# Patient Record
Sex: Female | Born: 1945 | Race: White | Hispanic: No | Smoking: Never smoker
Health system: Southern US, Community
[De-identification: ages and names within clinical notes are randomized; demographics above are authoritative.]

## PROBLEM LIST (undated history)

## (undated) DIAGNOSIS — IMO0001 Reserved for inherently not codable concepts without codable children: Secondary | ICD-10-CM

## (undated) DIAGNOSIS — E669 Obesity, unspecified: Secondary | ICD-10-CM

## (undated) DIAGNOSIS — S9304XA Dislocation of right ankle joint, initial encounter: Secondary | ICD-10-CM

## (undated) DIAGNOSIS — F419 Anxiety disorder, unspecified: Secondary | ICD-10-CM

## (undated) DIAGNOSIS — F0390 Unspecified dementia without behavioral disturbance: Secondary | ICD-10-CM

## (undated) DIAGNOSIS — I2699 Other pulmonary embolism without acute cor pulmonale: Secondary | ICD-10-CM

## (undated) DIAGNOSIS — I509 Heart failure, unspecified: Secondary | ICD-10-CM

## (undated) DIAGNOSIS — J449 Chronic obstructive pulmonary disease, unspecified: Secondary | ICD-10-CM

## (undated) DIAGNOSIS — J189 Pneumonia, unspecified organism: Secondary | ICD-10-CM

## (undated) DIAGNOSIS — S82891A Other fracture of right lower leg, initial encounter for closed fracture: Secondary | ICD-10-CM

## (undated) DIAGNOSIS — I10 Essential (primary) hypertension: Secondary | ICD-10-CM

## (undated) DIAGNOSIS — E119 Type 2 diabetes mellitus without complications: Secondary | ICD-10-CM

---

## 1996-01-29 HISTORY — PX: KNEE SURGERY: SHX244

## 2004-10-16 ENCOUNTER — Ambulatory Visit: Payer: Self-pay | Admitting: Cardiology

## 2004-12-25 ENCOUNTER — Encounter: Admission: RE | Admit: 2004-12-25 | Discharge: 2004-12-25 | Payer: Self-pay | Admitting: Internal Medicine

## 2005-02-25 ENCOUNTER — Encounter: Admission: RE | Admit: 2005-02-25 | Discharge: 2005-02-25 | Payer: Self-pay | Admitting: Internal Medicine

## 2005-03-11 ENCOUNTER — Encounter: Admission: RE | Admit: 2005-03-11 | Discharge: 2005-03-11 | Payer: Self-pay | Admitting: Internal Medicine

## 2005-07-02 ENCOUNTER — Encounter: Admission: RE | Admit: 2005-07-02 | Discharge: 2005-07-02 | Payer: Self-pay | Admitting: Internal Medicine

## 2005-10-16 ENCOUNTER — Encounter: Admission: RE | Admit: 2005-10-16 | Discharge: 2005-10-16 | Payer: Self-pay | Admitting: Neurosurgery

## 2005-11-29 ENCOUNTER — Ambulatory Visit: Admission: RE | Admit: 2005-11-29 | Discharge: 2005-11-29 | Payer: Self-pay | Admitting: Neurosurgery

## 2005-12-05 ENCOUNTER — Ambulatory Visit: Payer: Self-pay | Admitting: Internal Medicine

## 2006-01-03 ENCOUNTER — Ambulatory Visit: Admission: RE | Admit: 2006-01-03 | Discharge: 2006-01-03 | Payer: Self-pay | Admitting: Neurosurgery

## 2006-01-15 ENCOUNTER — Ambulatory Visit: Payer: Self-pay | Admitting: Cardiology

## 2006-01-28 HISTORY — PX: BACK SURGERY: SHX140

## 2006-02-14 ENCOUNTER — Ambulatory Visit: Payer: Self-pay | Admitting: Cardiology

## 2006-03-05 ENCOUNTER — Inpatient Hospital Stay (HOSPITAL_COMMUNITY): Admission: RE | Admit: 2006-03-05 | Discharge: 2006-03-12 | Payer: Self-pay | Admitting: Neurosurgery

## 2006-03-05 ENCOUNTER — Ambulatory Visit: Payer: Self-pay | Admitting: Pulmonary Disease

## 2006-03-06 ENCOUNTER — Ambulatory Visit: Payer: Self-pay | Admitting: Physical Medicine & Rehabilitation

## 2006-04-24 ENCOUNTER — Encounter: Admission: RE | Admit: 2006-04-24 | Discharge: 2006-04-24 | Payer: Self-pay | Admitting: Neurosurgery

## 2006-06-26 ENCOUNTER — Encounter: Admission: RE | Admit: 2006-06-26 | Discharge: 2006-06-26 | Payer: Self-pay | Admitting: Neurosurgery

## 2010-02-17 ENCOUNTER — Encounter: Payer: Self-pay | Admitting: Neurosurgery

## 2010-02-18 ENCOUNTER — Encounter: Payer: Self-pay | Admitting: Internal Medicine

## 2010-06-15 NOTE — H&P (Signed)
NAMEELVI, [name]                 ACCOUNT NO.:  [id number]   MEDICAL RECORD NO.:  [id number]          PATIENT TYPE:  INP   LOCATION:  3110                         FACILITY:  [facility]   PHYSICIAN:  Hilda Lias, M.D.   DATE OF BIRTH:  [birth year]   DATE OF ADMISSION:  03/05/2006  DATE OF DISCHARGE:                              HISTORY & PHYSICAL   [name] is a lady who was seen by me in my office  back in September  2007 with a complaint of back pain that had been going on for the last 3  years to the point that when she came, she was crying because the pain  unbearable.  She told me that she did not want to live the way it was  and she preferred to die.  The pain was in both legs but the left one  was worse than the right one.  The pain is aggravated because she has  had bilateral knee replacement and she did not improve.  We scheduled  her for surgery but we found that she has poorly controlled diabetes as  well as blood pressure.  Twice we had to cancel and today she had been  cleared by the cardiologist and pulmonologist.   PAST MEDICAL HISTORY:  Total knee replacement.   ALLERGIES:  SHE IS ALLERGIC TO TALWIN.   SOCIAL HISTORY:  Patient does not smoke, does not drink.   She is 5' 1 and she is over 180 kg.   REVIEW OF SYSTEMS:  Positive for back pain, leg pain, arthritis, high  blood pressure, asthma, emphysema, sleep apnea.   PHYSICAL EXAMINATION:  Patient came to see me in my office __________  normal.  NECK:  She has __________  bilaterally.  CARDIOVASCULAR:   ***AWAITING CARDIOVASCULAR MACRO***  ABDOMEN:  Difficult to palpate any mass secondary to her obesity.  EXTREMITIES:  She has a scar in the knee.  It is difficult to assess any  weakness in the lower extremity because the patient __________ .  It  seems that she has a good sensation in the lower extremity.  Reflexes  unable to get.   The MRI shows that she has facet arthropathy of the lower L4-5, L5, S1.   IMPRESSION:  Chronic back pain with degenerative disc disease and facet  arthropathy L4-5, L5, S1.   RECOMMENDATIONS:  The patient is being admitted for surgery.  The  procedure will be at L4-5, L5, S1 discectomy, interbody fusion,  pedicular screws posterior lateral effusion.  The risks, of course,  __________  because of the chronicity of the pain, infection, damage to  vessel of the abdomen, pulmonary emboli, pneumonia and all the risks  associated with morbid obesity.          ______________________________  Hilda Lias, M.D.    EB/MEDQ  D:  03/05/2006  T:  03/06/2006  Job:  [id number]

## 2010-06-15 NOTE — Op Note (Signed)
[name], [name]                 ACCOUNT NO.:  [id number]   MEDICAL RECORD NO.:  [id number]          PATIENT TYPE:  INP   LOCATION:  2899                         FACILITY:  [facility]   PHYSICIAN:  Hilda Lias, M.D.   DATE OF BIRTH:  [birth year]   DATE OF PROCEDURE:  03/05/2006  DATE OF DISCHARGE:                               OPERATIVE REPORT   ADMISSION DIAGNOSIS:  Degenerative disk disease with chronic back pain,  facet arthropathy L4-5, L5-S1.  Morbid obesity and sleep apnea, diabetes  mellitus.   POSTOPERATIVE DIAGNOSIS:  Degenerative disk disease with chronic back  pain, facet arthropathy L4-5, L5-S1.  Morbid obesity and sleep apnea,  diabetes mellitus.   PROCEDURE:  Bilateral L4-5 laminectomy, bilateral L4-5 diskectomy,  interbody fusion.  Pedicle screws L4, L5-S1, posterolateral arthrodesis.  Cell Saver, C-arm.   SURGEON:  Hilda Lias, M.D.   ASSISTANT:  Payton Doughty, M.D.   CLINICAL HISTORY:  The patient was admitted because of back pain with  radiation to both legs.  X-rays show severe case of degenerative disk  disease at the level 4-5, 5-1.  The patient has had this problem for  more than three years.  The risks were explained in the history and  physical.   PROCEDURE:  The patient was taken to the OR and because of her obesity,  it was difficult to position her on the OR table.  Finally with support,  we were able to position her in a prone manner.  The back was prepped  with DuraPrep.  Drapes were applied.  A midline incision from in the  midline was made.  We were unable to palpate any bone.  We carried our  incision until we found the spinous process.  We retracted laterally and  at the end we were able to visualize the L5-S1 and L4-L5 and L3-L4.  Traction was applied.  Then we tried to do x-ray but it was so grainy so  we had to count from the bottom wrap.  The radiologist was unable to  tell us what level we were.  We found the lower interspace, from  then  on, we removed the spinous process of 4 and 5 as well as the lamina.  We  did a facetectomy 4-5 bilaterally.  We tried to enter the disk space at  the level of L5-S1 but it was quite narrow.  __________  the right and  __________ the left side we were unable to get because of severity of  the stenosis.  We retracted the thecal sac at the level of 4-5 and we  did a total gross diskectomy at this level.  The endplates were removed  and the disk was replaced at this level with cage of 12 x 22 with BMP  and autograft inside.  Using the C-arm, using the AP and lateral view we  visualized the pedicle 4,  5 and S1.  Pedicle probe was inserted  followed by screws of 5.5 x 45 at the level of 4 and 5 and 5.5 x 50 at  the level of S1.  AP and lateral showed good position of pedicle screws.  Nevertheless, prior to inserting the pedicle screw, we probed the area  and there was bone surrounding the entrance.  From then on the screw was  connected with a rod and secured in place with caps.  Then we went  laterally and we removed the periosteum of transverse process of 4, 5  and the ala of the sacrum.  Then a  mix of autograft and BMP was used for postop arthrodesis.  From then on  the area was irrigated.  Hemostasis was negative.  A Valsalva maneuver  was negative.  The wound was closed with Vicryl and Steri-Strips.  The  patient will be going to the intensive care unit and we are going to  call the __________ to help her with her care.           ______________________________  Hilda Lias, M.D.     EB/MEDQ  D:  03/05/2006  T:  03/05/2006  Job:  [id number]

## 2010-06-15 NOTE — Assessment & Plan Note (Signed)
[facility]                               PULMONARY OFFICE NOTE   [name], [name]                        MRN:          [id number]  DATE:12/05/2005                            DOB:          09/14/1945    REASON FOR CONSULTATION:  Sleep apnea and asthma.   HISTORY:  A 65 year old white female contemplating major back surgery with a  lifelong history of asthma who carries a diagnosis of COPD and reports  frequent exacerbations of coughing and wheezing and shortness of breath for  which she uses rounds of prednisone. The most recent of which was completed  on November 3rd and is back to baseline. She says she has no way of  predicting when the next flare up will occur (it may be weeks, it may be  months before she needs another round of prednisone). She has no typical  seasonal variation or classic allergic features. She has been on allergy  shots several times, the last time in then 1980's and is documented with  skin testing positive for trees, pollens and dust. She denies any  orthopnea, PND, does sleep at 30 degrees, but has carried a diagnosis of  sleep apnea and was last studied two years ago, but could not tolerate any  form of CPAP or BiPAP and was never was placed on mask without a asthma  flare-up and therefore is on oxygen at 2 liters at bedtime. She says she  sleeps well. Does not wake up with any headache or excessive  hypersomnolence, but has been reported to snore loudly.   PAST MEDICAL HISTORY:  Significant for:  1. Morbid obesity.  2. Hypertension.  3. Diabetes.  4. 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  5. Is status post two knee replacement surgeries, the most recent in 2001,      at [place].   ALLERGIES:  TALWIN CAUSES HER TO BE EXTREMELY HOT.   CURRENT MEDICATIONS:  Include Singulair, Diovan, Nexium, metformin and  Glucophage, Arthrotec, Coumadin, Ativan, DuoNeb and oxygen 2  liters at  bedtime, plus Xopenex p.r.n. She says she does not need the Xopenex unless  she is having a flare.   SOCIAL HISTORY:  She has never smoked. Is on disability with no unusual  travel, pet or hobby exposure.   FAMILY HISTORY:  Positive for asthma and atopy in her mother only.   REVIEW OF SYSTEMS:  Taken in detail on the worksheet. Presently, she is more  limited by back pain than she is by dyspnea, walking with a rolling walker  from room to room before her back gives out.   PHYSICAL EXAMINATION:  This is a depressed, ambulatory, white female who had  a difficult time answering any questions in a straight forward fashion. She  is afebrile, normal vital signs, with a weight of 316 pounds.  HEENT: Is unremarkable. Oropharynx is clear. Nasal turbinates are normal.  Ear canals are clear bilaterally.  NECK: Supple without cervical adenopathy or tenderness. Trachea is midline.  No  thyromegaly.  LUNGS: Lung fields perfectly clear bilaterally to auscultation and  percussion with relatively short inspiratory and expiratory time.  HEART: There is regular rate and rhythm without murmur, gallop or rub.  ABDOMEN: Soft, benign.  EXTREMITIES: Warm without calf tenderness, cyanosis, clubbing.   I reviewed her chest x-ray from November 29, 2005; it is normal. PFTs were  performed today and show a purely restrictive pattern.   IMPRESSION:  1. Morbid obesity is this patient's primary pulmonary problem. This has      been complicated by documentation of sleep apnea, but note the absence      of hypersomnolence presently or morning headache. She does appear to be      O2 dependent nocturnally and would be at high risk for exacerbation of      this problem if requiring narcotics for pain or placed in a recumbent      position for a prolonged period of time. We can offset this to some      extent by using bilevel positive airway pressure and work with her to      overcome the problem that she  has of asthma from bilevel positive      airway pressure (see comments below) and because she is now so      immobilized by her back, I do not think that there is anything that we      can help her do in terms of immediate weight loss preoperatively to      tune her up. Therefore, I think I would recommend proceeding with      surgery, but explained to the patient that there was no way we could      minimize her risk and explained the risk including recurrent clots (off      of Coumadin), recurrent deep venous thrombosis and pulmonary embolism      off of Coumadin for surgery and atelectasis pneumonia, hospital      acquired infection, etc.  2. I do not see any evidence of chronic obstructive pulmonary disease      either by history or her pulmonary function tests. Her pulmonary      function tests are purely restrictive presently after a round of      prednisone for presumed asthma. The asthma that is present may be      partly reflux related, but has dramatically responded to prednisone as      in the past, suggesting to me she should be treated more as an      asthmatic than as a chronic obstructive pulmonary disease patient, and      I would recommend stopping DuoNeb and consider replacing it with a      combination product like Symbicort or alternate formoterol and      budesonide in the same nebulizer (if she prefers nebulizer over      Symbicort metered-dose inhaler). She tells me she tried Advair DPI and      did not like it.  3. History of deep venous thrombosis, pulmonary embolism related to morbid      obesity; therefore, she needs to be maintained on Coumadin for the rest      of her life or until she loses substantial weight. This will be a      problem peri-operatively since the Coumadin will need to be stopped and      I emphasized to the patient that the key is mobilization and we will  have to start her on Lovenox immediately post-op as soon as hemostasis      allows.   We would be happy to see this patient in the peri-operative period if  needed.    ______________________________  Charlaine Dalton Sherene Sires, MD, [facility]    MBW/MedQ  DD: 12/05/2005  DT: 12/05/2005  Job #: [id number]   cc:   Hilda Lias, M.D.  Dhruv Sherril Croon

## 2010-06-15 NOTE — Consult Note (Signed)
[name], [name]                 ACCOUNT NO.:  [id number]   MEDICAL RECORD NO.:  [id number]          PATIENT TYPE:  INP   LOCATION:  3110                         FACILITY:  [facility]   PHYSICIAN:  Gailen Shelter, MD  DATE OF BIRTH:  05/11/1945   DATE OF CONSULTATION:  03/05/2006  DATE OF DISCHARGE:                                 CONSULTATION   REASON FOR CONSULTATION:  Postoperative management of sleep apnea and  asthma.   BRIEF HISTORY:  This is a 65 year old white female who has undergone a  laminectomy for chronic back pain today by Dr. Hilda Lias.  She has  a history of obstructive sleep apnea and asthma.  She was evaluated  preoperatively by Dr. Sandrea Hughs in November of 2007.  For the details  of that evaluation, please refer to Dr. Thurston Hole extensive consultation  note dated December 05, 2005.  She is currently in the PACU after surgery  somewhat sleepy with pain medications.  She is on a PCA morphine at full  dose.  The patient has had prior to this admission frequent  exacerbations of coughing and wheezing, shortness of breath for which  she uses rounds of prednisone.  Apparently, these flare ups are not  predictable.  Dr. Sherene Sires invoked a potential gastroesophageal reflux as  cause.  She has no seasonal variation to these symptoms.  The patient  has had a history of sleep apnea in the past and has been tried on CPAP  or BiPAP but has never been able to tolerate this.  She apparently uses  nocturnal oxygen at 2 liters at bedtime.  Again today, the patient  cannot relate any complaints; currently only complaining of back pain  postoperatively.   PAST MEDICAL HISTORY:  1. Significant for morbid obesity.  2. She also has history of hypertension.  3. History of diabetes.  4. Deep venous thrombosis with prior pulmonary embolism for which she      has been on chronic Coumadin.  5. She is status post knee replacement surgeries, most recent 2001 in      [place], [place].   ALLERGIES:  Are reported to TALWIN.  This is basically an intolerance,  causing the patient to become extremely hot..   CURRENT MEDICATIONS:  Include Singulair, Diovan, Nexium, metformin,  Glucophage, Arthrotec, Coumadin, Ativan, DuoNeb and oxygen 2 liters per  minute at bedtime.  The patient uses Xopenex p.r.n. when she flares up.  For the exact dosages, please refer to the medication reconciliation  form.   CURRENT MEDICATIONS:  Here while in the hospital are per [facility]; these have  been reviewed.   SOCIAL HISTORY:  She is a never smoker.  She is on disability due to  chronic low back pain and morbid obesity.  She has no history of  significant occupational exposure.   FAMILY HISTORY:  Is noncontributory for purposes of care and were  obtained from the patient's prior record.   REVIEW OF SYSTEMS:  As noted, the patient is somewhat groggy.  She is  arousable.  Her only complaint right now is  that of back pain.  She  denies any dyspnea.   PHYSICAL EXAMINATION:  Reveals a morbidly obese female who is somewhat  lethargic buy arousable in the PACU.  She is currently on morphine  infusion at full-dose PCA.   VITAL SIGNS:  Blood pressure is 115/55, saturations are 100% on 2 liters  per minute, heart rate is 88.  The patient is a febrile.  HEENT:  Examination reveals somewhat dry oral mucosa.  Neck is supple.  No adenopathy noted.  JVD.  LUNGS:  Are clear to auscultation bilateral anteriorly.  The patient  could not be assessed posteriorly due to being supine on a PACU  stretcher and also due to her size.  CARDIAC EXAMINATION:  Regular rate and rhythm.  No rubs, murmurs or  gallops heard.  ABDOMEN:  Is benign with no hepatosplenomegaly noted.  However,  examination was limited due to the patient's large size.  GU:  The patient has a Foley in place which is draining clear urine.  EXTREMITIES:  The patient has compression devices in place.  No edema  noted.  NEUROLOGIC:   Examination is grossly nonfocal for the limited examination  I am able to do.   The laboratory data has been reviewed.   IMPRESSION:  1. Restrictive physiology due to the patient's morbid obesity which is      basically the main potential issue with regards to her      postoperative recuperation.  I do not believe the patient has true      airways reactivity; however, we will recommend regimen for this in      the event that this is the case.  I suspect that she has obesity      with obesity hypoventilation syndrome as her main respiratory      issue.  2. Potential gastroesophageal reflux.  3. History of deep venous thrombosis with pulmonary embolism in the      past.  The patient will be a high risk given her obesity and needs      to continue lifelong anticoagulation unless she would lose a      significant amount of weight.  4. Obstructive sleep apnea treated with oxygen.   RECOMMENDATIONS:  1. We will place the patient on p.r.n. nebulization therapy while she      is in-house.  2. The patient will be maintained on nocturnal oxygen.  However, we      will give a trial of auto-set CPAP with nasal mask to see if she      tolerates, particularly given the fact that she is currently under      narcotic use for back pain and this will aggravate her      hypoventilation.  3. The patient will have to start her Lovenox immediately as      hemostasis allows and is cleared by neurosurgery and start Coumadin      back as soon as possible.  4. We will place the patient on empiric proton pump inhibitors.  We      will do a twice a day dosage given her potential problems with      reflux and this triggering airways reactivity.  5. I would limit the amount of narcotics used given the fact that this      aggravates the patient's ventilation.  6. We will continue to monitor and follow her along with you in her      postoperative phase.     C.  Danice Goltz, MD  Electronically  Signed     CLG/MEDQ  D:  03/05/2006  T:  03/06/2006  Job:  [id number]   cc:   Hilda Lias, M.D.  Charlaine Dalton. Sherene Sires, MD, FCCP

## 2010-06-15 NOTE — Discharge Summary (Signed)
NAMECORTINA, VULTAGGIO                 ACCOUNT NO.:  [id number]   MEDICAL RECORD NO.:  [id number]          PATIENT TYPE:  INP   LOCATION:  3015                         FACILITY:  [facility]   PHYSICIAN:  Hilda Lias, M.D.   DATE OF BIRTH:  04/21/1945   DATE OF ADMISSION:  03/05/2006  DATE OF DISCHARGE:  03/12/2006                               DISCHARGE SUMMARY   ADMISSION DIAGNOSES:  1. Bilateral degenerative disk disease L4-L5, L5-S1.  2. Chronic obstructive pulmonary disease.  3. Sleep apnea.  4. Morbid obesity.  5. Hypertension.  6. Diabetes mellitus.   DISCHARGE DIAGNOSES:  1. Bilateral degenerative disk disease L4-L5, L5-S1.  2. Chronic obstructive pulmonary disease.  3. Sleep apnea.  4. Morbid obesity.  5. Hypertension.  6. Diabetes mellitus.   CLINICAL HISTORY:  The patient was admitted because of back pain  radiating to both legs. She failed with conservative treatment. The  patient has a history of sleep apnea as well as hypertension, diabetes.  She also has a history of DVT's, and she had been on Coumadin. Because  of findings the patient wanted to proceed with surgery.   LABORATORY DATA:  At the present time hemoglobin 11.1, hematocrit 34.7.  PT 13.8, PTT 30. Sodium 134.   COURSE IN HOSPITAL:  The patient was taken to surgery, and L4-L5, L5-S1  diskectomy and fusion was accomplished. Because of her history we kept  Ms. Sohm in the intensive care unit for 48-hour period. Since then she  was transferred to the floor. She has been ambulating. She had been seen  by the [facility] pulmonologist, Dr. Sherene Sires, who knew about her prior  surgery. Today she is doing much better. She is ambulating with help. We  are going to transfer her to different facility for further care. The  patient lives by herself.   CONDITION ON DISCHARGE:  Improved.   MEDICATIONS:  She will continue the present medication which will be  attached to the history.   DIET:  Will continue with 2000 ADA  diet.   ACTIVITY:  __________ to the rehab unit.   The patient will have an appointment to see me in my office in four  weeks.           ______________________________  Hilda Lias, M.D.     EB/MEDQ  D:  03/12/2006  T:  03/12/2006  Job:  [id number]

## 2010-06-15 NOTE — Assessment & Plan Note (Signed)
[facility]                          [facility] OFFICE NOTE   [name], [name]                        MRN:          [id number]  DATE:02/14/2006                            DOB:          01/20/1946    REFERRING PHYSICIAN:  Dhruv Vyas   REASON FOR CONSULTATION:  [name] is a 65 year old female, with no  known cardiac history, now referred for a preoperative cardiac  clearance.   The patient has cardiac risk factors notable for type 2 diabetes  mellitus and a history of hyperlipidemia.  She has no known history of  hypertension, tobacco smoking, or family history of premature coronary  artery disease.   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 She is awaiting clearance to undergo a complex  lower back surgery by Dr. Jeral Fruit.  She is anxious for this surgery given  that she has been experiencing severe pain for quite some time.  This  also greatly limits her mobility.  She does have morbid obesity but is  quite proud that she has lost 46 pounds in the past 6 months or so.   The patient denies any remote, or recent, history of chest discomfort,  either at rest or with exertion.  She was recently scheduled for an  adenosine stress Cardiolite which was done on December 19, reviewed by  Dr. Andee Lineman, revealing no perfusion evidence of ischemia/infarction with  normal left ventricular function (EF 62%).   Electrocardiogram today reveals normal sinus rhythm at 76 BPM with  normal axis and no ischemic changes.   ALLERGIES:  TALWIN.   CURRENT MEDICATIONS:  1. Singulair 10 daily.  2. Nexium 40 daily.  3. Diovan 160 daily.  4. Metformin 500 daily.  5. Glucotrol XL 5 daily.  6. Coumadin 5 as directed.  7. Percocet 10/325 q. 6 p.r.n.  8. Albuterol/Pulmicort nebulizer as directed.  9. 2 L oxygen nightly.   PAST MEDICAL HISTORY:  1. Recurrent pulmonary embolus/DVT.      a.     Initially in  2004 and a second episode in 2006.  2. Morbid obesity.  3. Asthma.  4. GERD.  5. Type 2 diabetes mellitus.  6. Severe arthritis.  7. Status post bilateral total knee replacement.      a.     2000 and 2001.  8. History of hyperlipidemia.  9. Normal left ventricular function.      a.     By 2-D echocardiogram August 2004.   SOCIAL HISTORY:  The patient is widowed.  Has 2 children.  She denies  any history of tobacco smoking or use of alcohol.   FAMILY HISTORY:  Mother deceased at age 53, secondary to myocardial  infarction.  Father succumbed to complications of cancer.   REVIEW OF SYSTEMS:  Has occasional episodes of wheezing and cough with  no recent exacerbation.  Has arthralgias.  Of note, patient has lost  approximately 46 pounds in the last 6 months.  Otherwise as per HPI.  The remaining systems are negative.   PHYSICAL  EXAMINATION:  Blood pressure 132/78, pulse 76, regular.  Weight  284.8.  Age 65 year old female, morbidly obese, sitting upright in no apparent  distress.  HEENT:  Normocephalic.  Atraumatic.  NECK:  Palpable bilateral carotid pulses without bruits; unable to  assess JVD secondary to neck girth.  LUNGS:  Clear to auscultation with diminished breath sounds in the  bases.  HEART:  Regular rate and rhythm (S1, S2).  A soft, grade 1/6 systolic  ejection murmur heard loudest at the base.  ABDOMEN:  Protuberant, but nontender with intact bowel sounds.  EXTREMITIES:  1+ bilateral lower extremity/pedal edema with nonpalpable  pulses.  NEURO:  No focal deficit.   IMPRESSION:  [name] is a 65 year old female who has several cardiac  risk factors but no known history of coronary artery disease, and who  had a normal adenosine stress Cardiolite study in December 2007.  She  also has a history of normal left ventricular function both by 2-D  echocardiogram in 2004, as well as by recent perfusion imaging.   The patient presents with no clinical history of signs or  symptoms  suggestive of unstable angina pectoris, or congestive heart failure.   The patient is awaiting clearance to undergo complex lower back surgery,  by Dr. Jeral Fruit, in the near future.   PLAN:  The patient is clear to proceed with surgery, as planned, and is  at low risk from a cardiovascular standpoint for perioperative  myocardial infarction.  No further cardiac workup is  recommended at this time.  We will have the patient return to Dr. Lewayne Bunting on an as-needed basis.      Gene Serpe, PA-C  Electronically Signed      Learta Codding, MD,FACC  Electronically Signed   GS/MedQ  DD: 02/14/2006  DT: 02/14/2006  Job #: [id number]   cc:   Hilda Lias, M.D.  Dhruv Sherril Croon

## 2014-11-26 ENCOUNTER — Inpatient Hospital Stay (HOSPITAL_COMMUNITY)
Admission: AD | Admit: 2014-11-26 | Discharge: 2014-11-30 | DRG: 492 | Disposition: A | Discharge status: Skilled Nursing Facility | Payer: Medicare Other | Source: Other Acute Inpatient Hospital | Attending: Internal Medicine | Admitting: Internal Medicine

## 2014-11-26 DIAGNOSIS — J189 Pneumonia, unspecified organism: Secondary | ICD-10-CM | POA: Diagnosis not present

## 2014-11-26 DIAGNOSIS — J449 Chronic obstructive pulmonary disease, unspecified: Secondary | ICD-10-CM | POA: Diagnosis not present

## 2014-11-26 DIAGNOSIS — J45909 Unspecified asthma, uncomplicated: Secondary | ICD-10-CM | POA: Diagnosis not present

## 2014-11-26 DIAGNOSIS — L0390 Cellulitis, unspecified: Secondary | ICD-10-CM | POA: Diagnosis not present

## 2014-11-26 DIAGNOSIS — S82891A Other fracture of right lower leg, initial encounter for closed fracture: Secondary | ICD-10-CM | POA: Diagnosis present

## 2014-11-26 DIAGNOSIS — I2699 Other pulmonary embolism without acute cor pulmonale: Secondary | ICD-10-CM | POA: Diagnosis present

## 2014-11-26 DIAGNOSIS — I110 Hypertensive heart disease with heart failure: Secondary | ICD-10-CM | POA: Diagnosis present

## 2014-11-26 DIAGNOSIS — S9304XA Dislocation of right ankle joint, initial encounter: Secondary | ICD-10-CM | POA: Diagnosis present

## 2014-11-26 DIAGNOSIS — I872 Venous insufficiency (chronic) (peripheral): Secondary | ICD-10-CM | POA: Diagnosis present

## 2014-11-26 DIAGNOSIS — W1830XA Fall on same level, unspecified, initial encounter: Secondary | ICD-10-CM | POA: Diagnosis not present

## 2014-11-26 DIAGNOSIS — Z6841 Body Mass Index (BMI) 40.0 and over, adult: Secondary | ICD-10-CM

## 2014-11-26 DIAGNOSIS — I248 Other forms of acute ischemic heart disease: Secondary | ICD-10-CM | POA: Diagnosis not present

## 2014-11-26 DIAGNOSIS — E118 Type 2 diabetes mellitus with unspecified complications: Secondary | ICD-10-CM

## 2014-11-26 DIAGNOSIS — R0602 Shortness of breath: Secondary | ICD-10-CM

## 2014-11-26 DIAGNOSIS — M79601 Pain in right arm: Secondary | ICD-10-CM | POA: Diagnosis present

## 2014-11-26 DIAGNOSIS — R079 Chest pain, unspecified: Secondary | ICD-10-CM | POA: Diagnosis not present

## 2014-11-26 DIAGNOSIS — G4733 Obstructive sleep apnea (adult) (pediatric): Secondary | ICD-10-CM | POA: Diagnosis not present

## 2014-11-26 DIAGNOSIS — I5033 Acute on chronic diastolic (congestive) heart failure: Secondary | ICD-10-CM | POA: Diagnosis present

## 2014-11-26 DIAGNOSIS — W19XXXA Unspecified fall, initial encounter: Secondary | ICD-10-CM

## 2014-11-26 DIAGNOSIS — G4730 Sleep apnea, unspecified: Secondary | ICD-10-CM | POA: Diagnosis present

## 2014-11-26 DIAGNOSIS — Z7901 Long term (current) use of anticoagulants: Secondary | ICD-10-CM

## 2014-11-26 DIAGNOSIS — E785 Hyperlipidemia, unspecified: Secondary | ICD-10-CM | POA: Diagnosis present

## 2014-11-26 DIAGNOSIS — D62 Acute posthemorrhagic anemia: Secondary | ICD-10-CM | POA: Diagnosis not present

## 2014-11-26 DIAGNOSIS — E119 Type 2 diabetes mellitus without complications: Secondary | ICD-10-CM | POA: Diagnosis present

## 2014-11-26 DIAGNOSIS — M25571 Pain in right ankle and joints of right foot: Secondary | ICD-10-CM | POA: Diagnosis present

## 2014-11-26 DIAGNOSIS — I214 Non-ST elevation (NSTEMI) myocardial infarction: Secondary | ICD-10-CM | POA: Diagnosis not present

## 2014-11-26 DIAGNOSIS — E669 Obesity, unspecified: Secondary | ICD-10-CM | POA: Diagnosis present

## 2014-11-26 DIAGNOSIS — Z419 Encounter for procedure for purposes other than remedying health state, unspecified: Secondary | ICD-10-CM

## 2014-11-26 DIAGNOSIS — M1990 Unspecified osteoarthritis, unspecified site: Secondary | ICD-10-CM | POA: Diagnosis present

## 2014-11-26 DIAGNOSIS — Z86711 Personal history of pulmonary embolism: Secondary | ICD-10-CM | POA: Diagnosis not present

## 2014-11-26 DIAGNOSIS — R7989 Other specified abnormal findings of blood chemistry: Secondary | ICD-10-CM | POA: Diagnosis not present

## 2014-11-26 DIAGNOSIS — S82851A Displaced trimalleolar fracture of right lower leg, initial encounter for closed fracture: Secondary | ICD-10-CM | POA: Diagnosis present

## 2014-11-26 DIAGNOSIS — S82899A Other fracture of unspecified lower leg, initial encounter for closed fracture: Secondary | ICD-10-CM

## 2014-11-26 HISTORY — DX: Dislocation of right ankle joint, initial encounter: S93.04XA

## 2014-11-26 HISTORY — DX: Other fracture of right lower leg, initial encounter for closed fracture: S82.891A

## 2014-11-26 LAB — GLUCOSE, CAPILLARY: GLUCOSE-CAPILLARY: 123 mg/dL — AB (ref 65–99)

## 2014-11-26 MED ORDER — MIDAZOLAM HCL 2 MG/2ML IJ SOLN
INTRAMUSCULAR | Status: AC
  Filled 2014-11-26: qty 4

## 2014-11-26 MED ORDER — FENTANYL CITRATE (PF) 250 MCG/5ML IJ SOLN
INTRAMUSCULAR | Status: AC
  Filled 2014-11-26: qty 5

## 2014-11-27 ENCOUNTER — Encounter (HOSPITAL_COMMUNITY): Payer: Self-pay | Admitting: Certified Registered"

## 2014-11-27 ENCOUNTER — Inpatient Hospital Stay (HOSPITAL_COMMUNITY): Payer: Medicare Other

## 2014-11-27 ENCOUNTER — Inpatient Hospital Stay (HOSPITAL_COMMUNITY): Payer: Medicare Other | Admitting: Certified Registered"

## 2014-11-27 ENCOUNTER — Encounter (HOSPITAL_COMMUNITY)
Admission: AD | Disposition: A | Discharge status: Skilled Nursing Facility | Payer: Self-pay | Source: Other Acute Inpatient Hospital | Attending: Internal Medicine

## 2014-11-27 DIAGNOSIS — R7989 Other specified abnormal findings of blood chemistry: Secondary | ICD-10-CM

## 2014-11-27 DIAGNOSIS — I872 Venous insufficiency (chronic) (peripheral): Secondary | ICD-10-CM

## 2014-11-27 DIAGNOSIS — J189 Pneumonia, unspecified organism: Secondary | ICD-10-CM | POA: Diagnosis present

## 2014-11-27 DIAGNOSIS — S9304XA Dislocation of right ankle joint, initial encounter: Secondary | ICD-10-CM

## 2014-11-27 DIAGNOSIS — E669 Obesity, unspecified: Secondary | ICD-10-CM

## 2014-11-27 DIAGNOSIS — Z7901 Long term (current) use of anticoagulants: Secondary | ICD-10-CM

## 2014-11-27 DIAGNOSIS — J449 Chronic obstructive pulmonary disease, unspecified: Secondary | ICD-10-CM | POA: Diagnosis present

## 2014-11-27 DIAGNOSIS — I214 Non-ST elevation (NSTEMI) myocardial infarction: Secondary | ICD-10-CM | POA: Diagnosis present

## 2014-11-27 DIAGNOSIS — I2699 Other pulmonary embolism without acute cor pulmonale: Secondary | ICD-10-CM

## 2014-11-27 DIAGNOSIS — G4730 Sleep apnea, unspecified: Secondary | ICD-10-CM

## 2014-11-27 DIAGNOSIS — S82891A Other fracture of right lower leg, initial encounter for closed fracture: Secondary | ICD-10-CM

## 2014-11-27 DIAGNOSIS — E119 Type 2 diabetes mellitus without complications: Secondary | ICD-10-CM

## 2014-11-27 DIAGNOSIS — I5033 Acute on chronic diastolic (congestive) heart failure: Secondary | ICD-10-CM

## 2014-11-27 HISTORY — PX: EXTERNAL FIXATION LEG: SHX1549

## 2014-11-27 HISTORY — DX: Dislocation of right ankle joint, initial encounter: S93.04XA

## 2014-11-27 HISTORY — DX: Other fracture of right lower leg, initial encounter for closed fracture: S82.891A

## 2014-11-27 LAB — CBC WITH DIFFERENTIAL/PLATELET
BASOS PCT: 0 %
Basophils Absolute: 0 10*3/uL (ref 0.0–0.1)
EOS PCT: 2 %
Eosinophils Absolute: 0.3 10*3/uL (ref 0.0–0.7)
HEMATOCRIT: 40.4 % (ref 36.0–46.0)
Hemoglobin: 12.2 g/dL (ref 12.0–15.0)
LYMPHS PCT: 6 %
Lymphs Abs: 1 10*3/uL (ref 0.7–4.0)
MCH: 27.5 pg (ref 26.0–34.0)
MCHC: 30.2 g/dL (ref 30.0–36.0)
MCV: 91 fL (ref 78.0–100.0)
MONO ABS: 1.1 10*3/uL — AB (ref 0.1–1.0)
MONOS PCT: 7 %
NEUTROS ABS: 12.4 10*3/uL — AB (ref 1.7–7.7)
Neutrophils Relative %: 85 %
Platelets: 214 10*3/uL (ref 150–400)
RBC: 4.44 MIL/uL (ref 3.87–5.11)
RDW: 15.4 % (ref 11.5–15.5)
WBC: 14.8 10*3/uL — ABNORMAL HIGH (ref 4.0–10.5)

## 2014-11-27 LAB — COMPREHENSIVE METABOLIC PANEL
ALBUMIN: 2.6 g/dL — AB (ref 3.5–5.0)
ALT: 29 U/L (ref 14–54)
ANION GAP: 10 (ref 5–15)
AST: 56 U/L — AB (ref 15–41)
Alkaline Phosphatase: 64 U/L (ref 38–126)
BILIRUBIN TOTAL: 1.6 mg/dL — AB (ref 0.3–1.2)
BUN: 16 mg/dL (ref 6–20)
CHLORIDE: 96 mmol/L — AB (ref 101–111)
CO2: 27 mmol/L (ref 22–32)
Calcium: 8.4 mg/dL — ABNORMAL LOW (ref 8.9–10.3)
Creatinine, Ser: 0.86 mg/dL (ref 0.44–1.00)
GFR calc Af Amer: >60 mL/min (ref >60)
GFR calc non Af Amer: >60 mL/min (ref >60)
GLUCOSE: 155 mg/dL — AB (ref 65–99)
POTASSIUM: 5.3 mmol/L — AB (ref 3.5–5.1)
SODIUM: 133 mmol/L — AB (ref 135–145)
TOTAL PROTEIN: 5.5 g/dL — AB (ref 6.5–8.1)

## 2014-11-27 LAB — URINE MICROSCOPIC-ADD ON

## 2014-11-27 LAB — URINALYSIS, ROUTINE W REFLEX MICROSCOPIC
Bilirubin Urine: NEGATIVE
Glucose, UA: NEGATIVE mg/dL
KETONES UR: NEGATIVE mg/dL
LEUKOCYTES UA: NEGATIVE
Nitrite: NEGATIVE
PROTEIN: NEGATIVE mg/dL
Specific Gravity, Urine: 1.021 (ref 1.005–1.030)
UROBILINOGEN UA: 0.2 mg/dL (ref 0.0–1.0)
pH: 5 (ref 5.0–8.0)

## 2014-11-27 LAB — PROTIME-INR
INR: 1.74 — ABNORMAL HIGH (ref 0.00–1.49)
Prothrombin Time: 20.3 seconds — ABNORMAL HIGH (ref 11.6–15.2)

## 2014-11-27 LAB — GLUCOSE, CAPILLARY
GLUCOSE-CAPILLARY: 143 mg/dL — AB (ref 65–99)
Glucose-Capillary: 149 mg/dL — ABNORMAL HIGH (ref 65–99)
Glucose-Capillary: 242 mg/dL — ABNORMAL HIGH (ref 65–99)
Glucose-Capillary: 143 mg/dL — ABNORMAL HIGH (ref 65–99)

## 2014-11-27 LAB — APTT: APTT: 47 s — AB (ref 24–37)

## 2014-11-27 LAB — TROPONIN I
TROPONIN I: 1.85 ng/mL — AB (ref <0.031)
TROPONIN I: 1.71 ng/mL — AB (ref <0.031)
Troponin I: 2.12 ng/mL (ref <0.031)

## 2014-11-27 LAB — MAGNESIUM: Magnesium: 1.7 mg/dL (ref 1.7–2.4)

## 2014-11-27 LAB — GRAM STAIN

## 2014-11-27 LAB — SURGICAL PCR SCREEN
MRSA, PCR: NEGATIVE
Staphylococcus aureus: NEGATIVE

## 2014-11-27 LAB — TSH: TSH: 9.347 u[IU]/mL — ABNORMAL HIGH (ref 0.350–4.500)

## 2014-11-27 LAB — PREALBUMIN: Prealbumin: 9 mg/dL — ABNORMAL LOW (ref 18–38)

## 2014-11-27 LAB — BRAIN NATRIURETIC PEPTIDE: B NATRIURETIC PEPTIDE 5: 163.5 pg/mL — AB (ref 0.0–100.0)

## 2014-11-27 LAB — PHOSPHORUS: Phosphorus: 3.8 mg/dL (ref 2.5–4.6)

## 2014-11-27 LAB — STREP PNEUMONIAE URINARY ANTIGEN: STREP PNEUMO URINARY ANTIGEN: NEGATIVE

## 2014-11-27 SURGERY — EXTERNAL FIXATION, LOWER EXTREMITY
Anesthesia: General | Site: Ankle | Laterality: Right

## 2014-11-27 MED ORDER — MENTHOL 3 MG MT LOZG
1.0000 | LOZENGE | OROMUCOSAL | Status: DC | PRN
  Filled 2014-11-27: qty 9

## 2014-11-27 MED ORDER — POTASSIUM CHLORIDE IN NACL 20-0.9 MEQ/L-% IV SOLN
INTRAVENOUS | Status: DC
  Administered 2014-11-27: 03:00:00 via INTRAVENOUS
  Filled 2014-11-27: qty 1000

## 2014-11-27 MED ORDER — ASPIRIN EC 325 MG PO TBEC
325.0000 mg | DELAYED_RELEASE_TABLET | Freq: Every day | ORAL | Status: DC
  Administered 2014-11-27 – 2014-11-28 (×2): 325 mg via ORAL
  Filled 2014-11-27 (×2): qty 1

## 2014-11-27 MED ORDER — IPRATROPIUM-ALBUTEROL 0.5-2.5 (3) MG/3ML IN SOLN
3.0000 mL | Freq: Four times a day (QID) | RESPIRATORY_TRACT | Status: DC
  Administered 2014-11-27 – 2014-11-28 (×6): 3 mL via RESPIRATORY_TRACT
  Filled 2014-11-27 (×6): qty 3

## 2014-11-27 MED ORDER — ONDANSETRON HCL 4 MG/2ML IJ SOLN
4.0000 mg | Freq: Once | INTRAMUSCULAR | Status: DC | PRN
  Filled 2014-11-27: qty 2

## 2014-11-27 MED ORDER — PROPOFOL 10 MG/ML IV BOLUS
INTRAVENOUS | Status: DC | PRN
  Administered 2014-11-27: 150 mg via INTRAVENOUS

## 2014-11-27 MED ORDER — METHOCARBAMOL 500 MG PO TABS
500.0000 mg | ORAL_TABLET | Freq: Four times a day (QID) | ORAL | Status: DC | PRN
  Administered 2014-11-27 – 2014-11-29 (×2): 500 mg via ORAL
  Filled 2014-11-27 (×4): qty 1

## 2014-11-27 MED ORDER — FENTANYL CITRATE (PF) 100 MCG/2ML IJ SOLN
INTRAMUSCULAR | Status: DC | PRN
  Administered 2014-11-27: 150 ug via INTRAVENOUS

## 2014-11-27 MED ORDER — SUCCINYLCHOLINE CHLORIDE 20 MG/ML IJ SOLN
INTRAMUSCULAR | Status: DC | PRN
  Administered 2014-11-27: 40 mg via INTRAVENOUS
  Administered 2014-11-27: 160 mg via INTRAVENOUS

## 2014-11-27 MED ORDER — ENOXAPARIN SODIUM 40 MG/0.4ML ~~LOC~~ SOLN: 40.0000 mg | SUBCUTANEOUS | Status: DC

## 2014-11-27 MED ORDER — VANCOMYCIN HCL 10 G IV SOLR
1250.0000 mg | Freq: Two times a day (BID) | INTRAVENOUS | Status: DC
Start: 2014-11-27 — End: 2014-11-28
  Administered 2014-11-27 – 2014-11-28 (×3): 1250 mg via INTRAVENOUS
  Filled 2014-11-27 (×4): qty 1250

## 2014-11-27 MED ORDER — ONDANSETRON HCL 4 MG/2ML IJ SOLN
INTRAMUSCULAR | Status: DC | PRN
  Administered 2014-11-27: 4 mg via INTRAVENOUS

## 2014-11-27 MED ORDER — FUROSEMIDE 10 MG/ML IJ SOLN
40.0000 mg | Freq: Once | INTRAMUSCULAR | Status: AC
  Administered 2014-11-27: 40 mg via INTRAVENOUS
  Filled 2014-11-27: qty 4

## 2014-11-27 MED ORDER — BUDESONIDE 0.25 MG/2ML IN SUSP: 0.2500 mg | Freq: Two times a day (BID) | RESPIRATORY_TRACT | Status: DC

## 2014-11-27 MED ORDER — CLOPIDOGREL BISULFATE 75 MG PO TABS: 75.0000 mg | ORAL_TABLET | Freq: Every day | ORAL | Status: DC

## 2014-11-27 MED ORDER — MEPERIDINE HCL 25 MG/ML IJ SOLN: 6.2500 mg | INTRAMUSCULAR | Status: DC | PRN

## 2014-11-27 MED ORDER — INSULIN ASPART 100 UNIT/ML ~~LOC~~ SOLN
0.0000 [IU] | Freq: Four times a day (QID) | SUBCUTANEOUS | Status: DC
  Administered 2014-11-27: 5 [IU] via SUBCUTANEOUS
  Administered 2014-11-27: 2 [IU] via SUBCUTANEOUS
  Administered 2014-11-28 (×2): 3 [IU] via SUBCUTANEOUS
  Administered 2014-11-28 (×2): 2 [IU] via SUBCUTANEOUS
  Administered 2014-11-29: 3 [IU] via SUBCUTANEOUS
  Administered 2014-11-29: 2 [IU] via SUBCUTANEOUS
  Administered 2014-11-29 (×2): 3 [IU] via SUBCUTANEOUS
  Administered 2014-11-30 (×3): 2 [IU] via SUBCUTANEOUS

## 2014-11-27 MED ORDER — MIRABEGRON ER 25 MG PO TB24
25.0000 mg | ORAL_TABLET | Freq: Every day | ORAL | Status: DC
  Administered 2014-11-27 – 2014-11-30 (×4): 25 mg via ORAL
  Filled 2014-11-27 (×5): qty 1

## 2014-11-27 MED ORDER — CEFAZOLIN SODIUM-DEXTROSE 2-3 GM-% IV SOLR
INTRAVENOUS | Status: DC | PRN
  Administered 2014-11-27: 2 g via INTRAVENOUS

## 2014-11-27 MED ORDER — ENOXAPARIN SODIUM 150 MG/ML ~~LOC~~ SOLN
150.0000 mg | Freq: Two times a day (BID) | SUBCUTANEOUS | Status: DC
  Administered 2014-11-27 – 2014-11-30 (×7): 150 mg via SUBCUTANEOUS
  Filled 2014-11-27 (×9): qty 1

## 2014-11-27 MED ORDER — CHLORHEXIDINE GLUCONATE CLOTH 2 % EX PADS
6.0000 | MEDICATED_PAD | Freq: Once | CUTANEOUS | Status: AC
  Administered 2014-11-27: 6 via TOPICAL

## 2014-11-27 MED ORDER — CEFAZOLIN SODIUM 1-5 GM-% IV SOLN
1.0000 g | Freq: Four times a day (QID) | INTRAVENOUS | Status: DC
  Administered 2014-11-27: 1 g via INTRAVENOUS
  Filled 2014-11-27 (×2): qty 50

## 2014-11-27 MED ORDER — ATORVASTATIN CALCIUM 80 MG PO TABS
80.0000 mg | ORAL_TABLET | Freq: Every day | ORAL | Status: DC
  Administered 2014-11-27 – 2014-11-29 (×3): 80 mg via ORAL
  Filled 2014-11-27 (×3): qty 1

## 2014-11-27 MED ORDER — PANTOPRAZOLE SODIUM 40 MG PO TBEC
40.0000 mg | DELAYED_RELEASE_TABLET | Freq: Every day | ORAL | Status: DC
  Administered 2014-11-27 – 2014-11-30 (×4): 40 mg via ORAL
  Filled 2014-11-27 (×4): qty 1

## 2014-11-27 MED ORDER — LACTATED RINGERS IV SOLN
INTRAVENOUS | Status: DC | PRN
  Administered 2014-11-27 (×2): via INTRAVENOUS

## 2014-11-27 MED ORDER — ONDANSETRON HCL 4 MG PO TABS: 4.0000 mg | ORAL_TABLET | Freq: Four times a day (QID) | ORAL | Status: DC | PRN

## 2014-11-27 MED ORDER — ONDANSETRON HCL 4 MG/2ML IJ SOLN: 4.0000 mg | Freq: Four times a day (QID) | INTRAMUSCULAR | Status: DC | PRN

## 2014-11-27 MED ORDER — METOCLOPRAMIDE HCL 5 MG PO TABS: 5.0000 mg | ORAL_TABLET | Freq: Three times a day (TID) | ORAL | Status: DC | PRN

## 2014-11-27 MED ORDER — PIPERACILLIN-TAZOBACTAM 3.375 G IVPB
3.3750 g | Freq: Three times a day (TID) | INTRAVENOUS | Status: DC
Start: 2014-11-27 — End: 2014-11-28
  Administered 2014-11-27 – 2014-11-28 (×5): 3.375 g via INTRAVENOUS
  Filled 2014-11-27 (×7): qty 50

## 2014-11-27 MED ORDER — ACETAMINOPHEN 650 MG RE SUPP: 650.0000 mg | Freq: Four times a day (QID) | RECTAL | Status: DC | PRN

## 2014-11-27 MED ORDER — ACETAMINOPHEN 325 MG PO TABS: 650.0000 mg | ORAL_TABLET | Freq: Four times a day (QID) | ORAL | Status: DC | PRN

## 2014-11-27 MED ORDER — MORPHINE SULFATE (PF) 2 MG/ML IV SOLN
1.0000 mg | INTRAVENOUS | Status: DC | PRN
  Administered 2014-11-27 – 2014-11-30 (×8): 2 mg via INTRAVENOUS
  Filled 2014-11-27 (×8): qty 1

## 2014-11-27 MED ORDER — HYDROCODONE-ACETAMINOPHEN 7.5-325 MG PO TABS
1.0000 | ORAL_TABLET | Freq: Four times a day (QID) | ORAL | Status: DC | PRN
  Administered 2014-11-27 – 2014-11-29 (×7): 2 via ORAL
  Filled 2014-11-27 (×7): qty 2

## 2014-11-27 MED ORDER — LIDOCAINE HCL (CARDIAC) 20 MG/ML IV SOLN
INTRAVENOUS | Status: DC | PRN
  Administered 2014-11-27: 100 mg via INTRAVENOUS

## 2014-11-27 MED ORDER — CLOPIDOGREL BISULFATE 75 MG PO TABS
300.0000 mg | ORAL_TABLET | Freq: Every day | ORAL | Status: DC
  Administered 2014-11-27 – 2014-11-28 (×2): 300 mg via ORAL
  Filled 2014-11-27 (×2): qty 4

## 2014-11-27 MED ORDER — CEFAZOLIN SODIUM-DEXTROSE 2-3 GM-% IV SOLR
INTRAVENOUS | Status: AC
  Filled 2014-11-27: qty 50

## 2014-11-27 MED ORDER — ONDANSETRON HCL 4 MG/2ML IJ SOLN
INTRAMUSCULAR | Status: AC
  Filled 2014-11-27: qty 2

## 2014-11-27 MED ORDER — METHOCARBAMOL 1000 MG/10ML IJ SOLN
500.0000 mg | Freq: Four times a day (QID) | INTRAMUSCULAR | Status: DC | PRN
  Filled 2014-11-27: qty 5

## 2014-11-27 MED ORDER — FERROUS FUMARATE 325 (106 FE) MG PO TABS
1.0000 | ORAL_TABLET | Freq: Every day | ORAL | Status: DC
  Administered 2014-11-27: 106 mg via ORAL
  Administered 2014-11-28: 1 via ORAL
  Administered 2014-11-29 – 2014-11-30 (×2): 106 mg via ORAL
  Filled 2014-11-27 (×5): qty 1

## 2014-11-27 MED ORDER — BUDESONIDE-FORMOTEROL FUMARATE 160-4.5 MCG/ACT IN AERO
2.0000 | INHALATION_SPRAY | Freq: Two times a day (BID) | RESPIRATORY_TRACT | Status: DC
  Administered 2014-11-27 – 2014-11-30 (×7): 2 via RESPIRATORY_TRACT
  Filled 2014-11-27: qty 6

## 2014-11-27 MED ORDER — METOCLOPRAMIDE HCL 5 MG/ML IJ SOLN: 5.0000 mg | Freq: Three times a day (TID) | INTRAMUSCULAR | Status: DC | PRN

## 2014-11-27 MED ORDER — HYDROMORPHONE HCL 1 MG/ML IJ SOLN: 0.2500 mg | INTRAMUSCULAR | Status: DC | PRN

## 2014-11-27 SURGICAL SUPPLY — 34 items
BANDAGE ELASTIC 3 VELCRO ST LF (GAUZE/BANDAGES/DRESSINGS) ×2 IMPLANT
BANDAGE ELASTIC 4 VELCRO ST LF (GAUZE/BANDAGES/DRESSINGS) ×2 IMPLANT
BANDAGE ELASTIC 6 VELCRO ST LF (GAUZE/BANDAGES/DRESSINGS) ×2 IMPLANT
BAR EXFX 350X11 NS LF (EXFIX) ×2
BAR GLASS FIBER EXFX 11X350 (EXFIX) ×4 IMPLANT
BNDG GAUZE ELAST 4 BULKY (GAUZE/BANDAGES/DRESSINGS) ×2 IMPLANT
CLAMP BLUE BAR TO PIN (MISCELLANEOUS) ×4 IMPLANT
COVER SURGICAL LIGHT HANDLE (MISCELLANEOUS) ×6 IMPLANT
DRAPE C-ARM 42X72 X-RAY (DRAPES) IMPLANT
DRAPE C-ARMOR (DRAPES) ×3 IMPLANT
DRAPE U-SHAPE 47X51 STRL (DRAPES) ×3 IMPLANT
GLOVE BIO SURGEON STRL SZ7.5 (GLOVE) ×3 IMPLANT
GLOVE BIO SURGEON STRL SZ8 (GLOVE) ×3 IMPLANT
GLOVE BIOGEL PI IND STRL 7.5 (GLOVE) ×1 IMPLANT
GLOVE BIOGEL PI IND STRL 8 (GLOVE) ×1 IMPLANT
GLOVE BIOGEL PI INDICATOR 7.5 (GLOVE) ×2
GLOVE BIOGEL PI INDICATOR 8 (GLOVE) ×4
GOWN STRL REUS W/ TWL LRG LVL3 (GOWN DISPOSABLE) ×2 IMPLANT
GOWN STRL REUS W/ TWL XL LVL3 (GOWN DISPOSABLE) ×1 IMPLANT
GOWN STRL REUS W/TWL LRG LVL3 (GOWN DISPOSABLE) ×6
GOWN STRL REUS W/TWL XL LVL3 (GOWN DISPOSABLE) ×3
KIT BASIN OR (CUSTOM PROCEDURE TRAY) ×3 IMPLANT
KIT ROOM TURNOVER OR (KITS) ×3 IMPLANT
NS IRRIG 1000ML POUR BTL (IV SOLUTION) ×3 IMPLANT
PACK ORTHO EXTREMITY (CUSTOM PROCEDURE TRAY) ×3 IMPLANT
PAD ARMBOARD 7.5X6 YLW CONV (MISCELLANEOUS) ×6 IMPLANT
PIN CLAMP 2BAR 75MM BLUE (PIN) ×2 IMPLANT
PIN HALF YELLOW 5X160X35 (PIN) ×4 IMPLANT
PIN TRANSFIXING 5.0 (PIN) ×2 IMPLANT
SPONGE GAUZE 4X4 12PLY STER LF (GAUZE/BANDAGES/DRESSINGS) ×2 IMPLANT
SPONGE SCRUB IODOPHOR (GAUZE/BANDAGES/DRESSINGS) ×5 IMPLANT
TOWEL OR 17X24 6PK STRL BLUE (TOWEL DISPOSABLE) ×6 IMPLANT
TOWEL OR 17X26 10 PK STRL BLUE (TOWEL DISPOSABLE) ×6 IMPLANT
UNDERPAD 30X30 INCONTINENT (UNDERPADS AND DIAPERS) ×3 IMPLANT

## 2014-11-27 NOTE — Anesthesia Postprocedure Evaluation (Signed)
Anesthesia Post Note  Patient: [name]  Procedure(s) Performed: Procedure(s) (LRB): EXTERNAL FIXATION LEG FOR RIGHT ANKLE FRACTURE (Right)  Anesthesia type: general  Patient location: PACU  Post pain: Pain level controlled  Post assessment: Patient's Cardiovascular Status Stable  Last Vitals:  Filed Vitals:   11/26/14 2335  BP: 147/78  Pulse: 87  Temp: 36.7 C  Resp: 18    Post vital signs: Reviewed and stable  Level of consciousness: sedated  Complications: No apparent anesthesia complications

## 2014-11-27 NOTE — Transfer of Care (Signed)
Immediate Anesthesia Transfer of Care Note  Patient: [name] ShaggyLinda C [name]  Procedure(s) Performed: Procedure(s): EXTERNAL FIXATION LEG FOR RIGHT ANKLE FRACTURE (Right)  Patient Location: PACU  Anesthesia Type:General  Level of Consciousness: awake and alert   Airway & Oxygen Therapy: Patient Spontanous Breathing and Patient connected to face mask oxygen  Post-op Assessment: Report given to RN and Post -op Vital signs reviewed and stable  Post vital signs: Reviewed and stable  Last Vitals:  Filed Vitals:   11/26/14 2335  BP: 147/78  Pulse: 87  Temp: 36.7 C  Resp: 18    Complications: No apparent anesthesia complications

## 2014-11-27 NOTE — Progress Notes (Signed)
[facility] [place]  Patient is alert and oriented x 4.  Pain to right leg with movement.  She has been NPO since lunch time.

## 2014-11-27 NOTE — Consult Note (Signed)
Orthopaedic Trauma Service Consultation  Reason for Consult: Right ankle posterior dislocation, subacute, >48 hrs old Referring Physician: Dr. Pranav Patel  [name] is an 69 y.o. female.  HPI: Patient transferred emergently from [facility] for management of right ankle fracture dislocation.  No ortho coverage there and I spoke with local MD's who were unable to persuade ER staff to attempt reduction.  Skin and ankle joint at significant risk of breakdown and complications. I accepted patient in transfer as the closest facility and recommended emergent surgery on arrival for at least reduction of ankle and possible more definitive treatment based on findings.  Patient in significant ankle pain, dull and sharp, 8/10, worse with movement, and better with narcotics though not sufficiently.  Also reports right shoulder pain without distal paresthesias.  Past Medical History  Diagnosis Date  . Ankle fracture, right 11/27/2014  . Dislocation of right ankle joint 11/27/2014    History reviewed. No pertinent past surgical history.  History reviewed. No pertinent family history.  Social History:  has no tobacco, alcohol, and drug history on file.  Allergies:  Allergies  Allergen Reactions  . Fentanyl     fainted  . Talwin [Pentazocine]     Extreme hot feeling    Medications: I have reviewed the patient's current medications.  Results for orders placed or performed during the hospital encounter of 11/26/14 (from the past 48 hour(s))  Glucose, capillary     Status: Abnormal   Collection Time: 11/26/14 11:48 PM  Result Value Ref Range   Glucose-Capillary 123 (H) 65 - 99 mg/dL  Surgical pcr screen     Status: None   Collection Time: 11/27/14 12:05 AM  Result Value Ref Range   MRSA, PCR NEGATIVE NEGATIVE   Staphylococcus aureus NEGATIVE NEGATIVE    Comment:        The Xpert SA Assay (FDA approved for NASAL specimens in patients over 21 years of age), is one component of a  comprehensive surveillance program.  Test performance has been validated by [facility] for patients greater than or equal to 1 year old. It is not intended to diagnose infection nor to guide or monitor treatment.   Troponin I (q 6hr x 3)     Status: Abnormal   Collection Time: 11/27/14  4:43 AM  Result Value Ref Range   Troponin I 2.12 (HH) <0.031 ng/mL    Comment:        POSSIBLE MYOCARDIAL ISCHEMIA. SERIAL TESTING RECOMMENDED. CRITICAL RESULT CALLED TO, READ BACK BY AND VERIFIED WITH: WHITEHORN S,RN 11/27/14 0530 WAYK   Protime-INR     Status: Abnormal   Collection Time: 11/27/14  4:43 AM  Result Value Ref Range   Prothrombin Time 20.3 (H) 11.6 - 15.2 seconds   INR 1.74 (H) 0.00 - 1.49  APTT     Status: Abnormal   Collection Time: 11/27/14  4:43 AM  Result Value Ref Range   aPTT 47 (H) 24 - 37 seconds    Comment:        IF BASELINE aPTT IS ELEVATED, SUGGEST PATIENT RISK ASSESSMENT BE USED TO DETERMINE APPROPRIATE ANTICOAGULANT THERAPY.   Comprehensive metabolic panel     Status: Abnormal   Collection Time: 11/27/14  4:45 AM  Result Value Ref Range   Sodium 133 (L) 135 - 145 mmol/L   Potassium 5.3 (H) 3.5 - 5.1 mmol/L   Chloride 96 (L) 101 - 111 mmol/L   CO2 27 22 - 32 mmol/L   Glucose,   Bld 155 (H) 65 - 99 mg/dL   BUN 16 6 - 20 mg/dL   Creatinine, Ser 0.86 0.44 - 1.00 mg/dL   Calcium 8.4 (L) 8.9 - 10.3 mg/dL   Total Protein 5.5 (L) 6.5 - 8.1 g/dL   Albumin 2.6 (L) 3.5 - 5.0 g/dL   AST 56 (H) 15 - 41 U/L   ALT 29 14 - 54 U/L   Alkaline Phosphatase 64 38 - 126 U/L   Total Bilirubin 1.6 (H) 0.3 - 1.2 mg/dL   GFR calc non Af Amer >60 >60 mL/min   GFR calc Af Amer >60 >60 mL/min    Comment: (NOTE) The eGFR has been calculated using the CKD EPI equation. This calculation has not been validated in all clinical situations. eGFR's persistently <60 mL/min signify possible Chronic Kidney Disease.    Anion gap 10 5 - 15  CBC with Differential/Platelet     Status:  Abnormal   Collection Time: 11/27/14  4:45 AM  Result Value Ref Range   WBC 14.8 (H) 4.0 - 10.5 K/uL   RBC 4.44 3.87 - 5.11 MIL/uL   Hemoglobin 12.2 12.0 - 15.0 g/dL   HCT 40.4 36.0 - 46.0 %   MCV 91.0 78.0 - 100.0 fL   MCH 27.5 26.0 - 34.0 pg   MCHC 30.2 30.0 - 36.0 g/dL   RDW 15.4 11.5 - 15.5 %   Platelets 214 150 - 400 K/uL   Neutrophils Relative % 85 %   Neutro Abs 12.4 (H) 1.7 - 7.7 K/uL   Lymphocytes Relative 6 %   Lymphs Abs 1.0 0.7 - 4.0 K/uL   Monocytes Relative 7 %   Monocytes Absolute 1.1 (H) 0.1 - 1.0 K/uL   Eosinophils Relative 2 %   Eosinophils Absolute 0.3 0.0 - 0.7 K/uL   Basophils Relative 0 %   Basophils Absolute 0.0 0.0 - 0.1 K/uL  TSH     Status: Abnormal   Collection Time: 11/27/14  4:45 AM  Result Value Ref Range   TSH 9.347 (H) 0.350 - 4.500 uIU/mL  Prealbumin     Status: Abnormal   Collection Time: 11/27/14  4:45 AM  Result Value Ref Range   Prealbumin 9.0 (L) 18 - 38 mg/dL  Magnesium     Status: None   Collection Time: 11/27/14  4:45 AM  Result Value Ref Range   Magnesium 1.7 1.7 - 2.4 mg/dL  Phosphorus     Status: None   Collection Time: 11/27/14  4:45 AM  Result Value Ref Range   Phosphorus 3.8 2.5 - 4.6 mg/dL  Glucose, capillary     Status: Abnormal   Collection Time: 11/27/14  5:30 AM  Result Value Ref Range   Glucose-Capillary 149 (H) 65 - 99 mg/dL  Brain natriuretic peptide     Status: Abnormal   Collection Time: 11/27/14  7:11 AM  Result Value Ref Range   B Natriuretic Peptide 163.5 (H) 0.0 - 100.0 pg/mL  Gram stain     Status: None   Collection Time: 11/27/14 10:45 AM  Result Value Ref Range   Specimen Description URINE, RANDOM    Special Requests NONE    Gram Stain      WBC PRESENT,BOTH PMN AND MONONUCLEAR BUDDING YEAST SEEN CYTOSPIN SMEAR    Report Status 11/27/2014 FINAL   Strep pneumoniae urinary antigen  (not at [facility])     Status: None   Collection Time: 11/27/14 10:45 AM  Result Value Ref Range   Strep Pneumo   Urinary  Antigen NEGATIVE NEGATIVE    Comment:        Infection due to S. pneumoniae cannot be absolutely ruled out since the antigen present may be below the detection limit of the test.   Urinalysis, Routine w reflex microscopic (not at [facility])     Status: Abnormal   Collection Time: 11/27/14 10:45 AM  Result Value Ref Range   Color, Urine YELLOW YELLOW   APPearance CLOUDY (A) CLEAR   Specific Gravity, Urine 1.021 1.005 - 1.030   pH 5.0 5.0 - 8.0   Glucose, UA NEGATIVE NEGATIVE mg/dL   Hgb urine dipstick MODERATE (A) NEGATIVE   Bilirubin Urine NEGATIVE NEGATIVE   Ketones, ur NEGATIVE NEGATIVE mg/dL   Protein, ur NEGATIVE NEGATIVE mg/dL   Urobilinogen, UA 0.2 0.0 - 1.0 mg/dL   Nitrite NEGATIVE NEGATIVE   Leukocytes, UA NEGATIVE NEGATIVE  Urine microscopic-add on     Status: Abnormal   Collection Time: 11/27/14 10:45 AM  Result Value Ref Range   Squamous Epithelial / LPF FEW (A) RARE   WBC, UA 7-10 <3 WBC/hpf   Urine-Other FEW YEAST   Troponin I (q 6hr x 3)     Status: Abnormal   Collection Time: 11/27/14 11:20 AM  Result Value Ref Range   Troponin I 1.85 (HH) <0.031 ng/mL    Comment:        POSSIBLE MYOCARDIAL ISCHEMIA. SERIAL TESTING RECOMMENDED. CRITICAL VALUE NOTED.  VALUE IS CONSISTENT WITH PREVIOUSLY REPORTED AND CALLED VALUE.   Glucose, capillary     Status: Abnormal   Collection Time: 11/27/14 11:23 AM  Result Value Ref Range   Glucose-Capillary 143 (H) 65 - 99 mg/dL   Comment 1 Document in Chart     Dg Ankle Complete Right  11/27/2014  CLINICAL DATA:  External fixation of right ankle fracture. EXAM: RIGHT ANKLE - COMPLETE 3+ VIEW; DG C-ARM 61-120 MIN COMPARISON:  Right ankle radiographs performed 11/26/2014 FINDINGS: A single fluoroscopic C-arm image is provided from the OR. This demonstrates interval reduction of the previously noted right ankle dislocation, with mild residual displacement of distal fibular and medial malleolar fractures. A pin is seen overlying the  ankle mortise. Surrounding soft tissue swelling is noted. IMPRESSION: Interval reduction of right ankle dislocation, with associated pain overlying the ankle mortise. Electronically Signed   By: Jeffery  Chang M.D.   On: 11/27/2014 05:13   Ct Ankle Right Wo Contrast  11/27/2014  CLINICAL DATA:  Follow up right ankle fracture post external fixation. EXAM: CT OF THE RIGHT ANKLE WITHOUT CONTRAST TECHNIQUE: Multidetector CT imaging of the right ankle was performed according to the standard protocol. Multiplanar CT image reconstructions were also generated. COMPARISON:  Radiographs 11/27/2014. FINDINGS: External fixators traverse the mid tibial diaphysis and the calcaneal tuberosity. There is a rod traversing the subtalar and tibiotalar joints. The tibiotalar dislocation has been reduced. There is a comminuted intra-articular fracture of the distal tibia. There is involvement of the articular surface posteriorly, situated in the coronal plane. This fracture demonstrates up to 4 mm displacement medially and up to 2 mm of impaction of the articular surface posteriorly. Fracture extends into the base of the medial malleolus. Laterally, the fracture extends into the distal tibiofibular articulation. There are small intra-articular fracture fragments. No significant widening of the ankle mortise is demonstrated. There is a comminuted fracture of the distal tibial diaphysis with a 4.6 cm butterfly fragment anteriorly. This fracture is only mildly displaced. The talar dome appears intact. No acute fractures   are identified within the hindfoot. There is a possible nondisplaced fracture involving the base of the second metatarsal. Mild midfoot degenerative changes are present. There is no significant ankle joint effusion. Mild subcutaneous edema is present within lower leg and hindfoot. There are extensive vascular calcifications. IMPRESSION: 1. Improved alignment of comminuted intra-articular fracture of the distal tibia  status post fixation. There is mild displacement and depression of the articular surface of the tibial plafond posteriorly. 2. Comminuted fracture of the distal fibular diaphysis demonstrates minimal displacement. 3. Possible nondisplaced fracture of the second metatarsal base. Electronically Signed   By: William  Veazey M.D.   On: 11/27/2014 12:18   Dg Chest Port 1 View  11/27/2014  CLINICAL DATA:  Acute onset of shortness of breath, cough and congestion. Initial encounter. EXAM: PORTABLE CHEST 1 VIEW COMPARISON:  Chest radiograph performed 11/24/2014 FINDINGS: Previously noted pulmonary edema has improved. Residual mildly increased interstitial markings are noted. Vascular congestion is seen. No pleural effusion or pneumothorax is identified. The cardiomediastinal silhouette is borderline normal in size. No acute osseous abnormalities are identified. IMPRESSION: Interval improvement in pulmonary edema, though residual mildly increased interstitial markings are noted, with underlying vascular congestion. Electronically Signed   By: Jeffery  Chang M.D.   On: 11/27/2014 06:58   Dg Ankle Right Port  11/27/2014  CLINICAL DATA:  Postop external fixation of RIGHT ankle fracture EXAM: PORTABLE RIGHT ANKLE - 2 VIEW COMPARISON:  11/26/2014 FINDINGS: External fixator device applied with large K-wire extending through the talus into the tibia. There is interval reduction of the fracture dislocation. Trimalleolar fracture noted. IMPRESSION: 1. Interval reduction of the fracture dislocation with external fixator. 2. Trimalleolar fracture Electronically Signed   By: Stewart  Edmunds M.D.   On: 11/27/2014 07:17   Dg C-arm 1-60 Min  11/27/2014  CLINICAL DATA:  External fixation of right ankle fracture. EXAM: RIGHT ANKLE - COMPLETE 3+ VIEW; DG C-ARM 61-120 MIN COMPARISON:  Right ankle radiographs performed 11/26/2014 FINDINGS: A single fluoroscopic C-arm image is provided from the OR. This demonstrates interval  reduction of the previously noted right ankle dislocation, with mild residual displacement of distal fibular and medial malleolar fractures. A pin is seen overlying the ankle mortise. Surrounding soft tissue swelling is noted. IMPRESSION: Interval reduction of right ankle dislocation, with associated pain overlying the ankle mortise. Electronically Signed   By: Jeffery  Chang M.D.   On: 11/27/2014 05:13    ROS No recent fever, bleeding abnormalities, urologic dysfunction, GI problems, or weight gain. Blood pressure 121/73, pulse 88, temperature 98.3 F (36.8 C), temperature source Oral, resp. rate 16, height 5' 2" (1.575 m), weight 341 lb 9.6 oz (154.949 kg), SpO2 89 %. Physical Exam Pleasant despite pain Super morbid obesity LLE No traumatic wounds but ecchymosis  Tender, swollen, erythema, obvious deformity  Ankle effusion  Knee stable to varus/ valgus and anterior/posterior stress  Sens DPN, SPN, TN intact  Motor ext, flex slightly but painful  DP palpable, significant edema Bilat chronic venous skin changes Right shoulder tender but without crepitus, skin wounds, instability, or discrete sensory loss; rad 2+ LUEx shoulder, elbow, wrist, digits- no skin wounds, nontender, no instability, no blocks to motion  Sens  Ax/R/M/U intact  Mot   Ax/ R/ PIN/ M/ AIN/ U intact  Rad 2+  Assessment/ Plan: Right ankle trimalleolar fracture Right ankle dislocation Right proximal humerus severe DJD Emergent closed reduction and external fixation, high risk of perioperative complications, and long term complications.  I discussed with the patient the   risks and benefits of surgery, including the possibility of heart attack, stroke, infection, nerve injury, vessel injury, wound breakdown, arthritis, symptomatic hardware, DVT/ PE, loss of motion, and need for further surgery among others.  We also specifically discussed the need to stage surgery because of the elevated risk of soft tissue breakdown that  could lead to amputation.  She understood these risks and wished to proceed.    , MD [facility] [phone] (p)   11/27/2014  00:19 AM        

## 2014-11-27 NOTE — H&P (Signed)
[facility] History and Physical  Patient: [name]  MRN: [id number]  DOB: Oct 01, 1945  DOS: the patient was seen and examined on 11/27/2014 PCP: No primary care provider on file.  Referring physician: [facility] Chief Complaint: Right ankle pain fracture  HPI: [name] is a 69 y.o. female with Past medical history of obesity, hypertension, sleep apnea, dyslipidemia, chronic venous insufficiency, diastolic dysfunction, recurrent pulmonary embolism on chronic anticoagulation. The patient is presenting with complaints of right ankle pain. The patient was initially seen in [facility] ER and was admitted on 11/25/2014 at [facility]. Patient presented there after 2 days of generalized weakness, cough, shortness of breath. She also has a fall 2 days ago prior to arrival when she was ambulating she suddenly lost her balance and fell on the ground. She denied any chest pain at the time she denied any dizziness or lightheadedness. She denied any focal deficit. She was found to be having bilateral pneumonia as well as probable venous congestion with cellulitis. She was started on vancomycin. Patient was reevaluated on 11/26/2014 and was found to be having a right ankle fracture and then was referred here for further workup. Patient was already taken the OR before my evaluation and the patient was seen postoperatively. Patient was complaining of cough and sore throat and some shortness of breath.  he patient is coming from home  At her baseline ambulates with support And is independent for most of her ADL; manages her medication on her own.  Review of Systems: as mentioned in the history of present illness.  A comprehensive review of the other systems is negative.  Past Medical History  Diagnosis Date  . Ankle fracture, right 11/27/2014  . Dislocation of right ankle joint 11/27/2014   History reviewed. No pertinent past surgical history. Social History:  has no  tobacco, alcohol, and drug history on file.  Allergies  Allergen Reactions  . Fentanyl     fainted  . Talwin [Pentazocine]     Extreme hot feeling    History reviewed. No pertinent family history.  Prior to Admission medications   Medication Sig Start Date End Date Taking? Authorizing Provider  albuterol (ACCUNEB) 0.63 MG/3ML nebulizer solution Take 1 ampule by nebulization every 6 (six) hours as needed for wheezing.   Yes Historical Provider, MD  albuterol (PROVENTIL HFA;VENTOLIN HFA) 108 (90 BASE) MCG/ACT inhaler Inhale into the lungs every 6 (six) hours as needed for wheezing or shortness of breath.   Yes Historical Provider, MD  budesonide (PULMICORT) 0.25 MG/2ML nebulizer solution Take 0.25 mg by nebulization as needed.   Yes Historical Provider, MD  budesonide-formoterol (SYMBICORT) 160-4.5 MCG/ACT inhaler Inhale 2 puffs into the lungs 2 (two) times daily.   Yes Historical Provider, MD  Calcium Citrate-Vitamin D (CALCIUM + D PO) Take by mouth.   Yes Historical Provider, MD  esomeprazole (NEXIUM) 40 MG capsule Take 40 mg by mouth daily at 12 noon.   Yes Historical Provider, MD  ferrous fumarate (HEMOCYTE - 106 MG FE) 325 (106 FE) MG TABS tablet Take 1 tablet by mouth.   Yes Historical Provider, MD  metFORMIN (GLUCOPHAGE) 1000 MG tablet Take 1,000 mg by mouth 2 (two) times daily with a meal. 1000 in AM and 500 in PM   Yes Historical Provider, MD  Mirabegron (MYRBETRIQ PO) Take by mouth.   Yes Historical Provider, MD  ranitidine (ZANTAC) 150 MG tablet Take 150 mg by mouth 2 (two) times daily.   Yes Historical Provider, MD  SITagliptin Phosphate (JANUVIA PO) Take by mouth.   Yes Historical Provider, MD  WARFARIN SODIUM PO Take by mouth.   Yes Historical Provider, MD    Physical Exam: Filed Vitals:   11/27/14 0250 11/27/14 0252 11/27/14 0312 11/27/14 0601  BP: 135/85 134/77 120/93 121/73  Pulse: 93 92 92 88  Temp:  97.7 F (36.5 C) 97.9 F (36.6 C) 98.3 F (36.8 C)  TempSrc:    Oral Oral  Resp: Height:      Weight:      SpO2: 95% 95% 95% 94%    General: Alert, Awake and Oriented to Time, Place and Person. Appear in mild distress Eyes: PERRL ENT: Oral Mucosa clear moist. Neck: Difficult to assess JVD Cardiovascular: S1 and S2 Present, no Murmur, Peripheral Pulses Present Respiratory: Bilateral Air entry equal and Decreased,  Bilateral Crackles, bilateral wheezes Abdomen: Bowel Sound present, Soft and no tenderness Skin: no Rash Extremities: Bilateral  Pedal edema, no calf tenderness Neurologic: Grossly no focal neuro deficit.  Labs on Admission:  CBC:  Recent Labs Lab 11/27/14 0445  WBC 14.8*  NEUTROABS 12.4*  HGB 12.2  HCT 40.4  MCV 91.0  PLT 214    CMP     Component Value Date/Time   NA 133* 11/27/2014 0445   K 5.3* 11/27/2014 0445   CL 96* 11/27/2014 0445   CO2 27 11/27/2014 0445   GLUCOSE 155* 11/27/2014 0445   BUN 16 11/27/2014 0445   CREATININE 0.86 11/27/2014 0445   CALCIUM 8.4* 11/27/2014 0445   PROT 5.5* 11/27/2014 0445   ALBUMIN 2.6* 11/27/2014 0445   AST 56* 11/27/2014 0445   ALT 29 11/27/2014 0445   ALKPHOS 64 11/27/2014 0445   BILITOT 1.6* 11/27/2014 0445   GFRNONAA >60 11/27/2014 0445   GFRAA >60 11/27/2014 0445     Recent Labs Lab 11/27/14 0443  TROPONINI 2.12*   BNP (last 3 results) No results for input(s): BNP in the last 8760 hours.  ProBNP (last 3 results) No results for input(s): PROBNP in the last 8760 hours.   Radiological Exams on Admission: Dg Ankle Complete Right  11/27/2014  CLINICAL DATA:  External fixation of right ankle fracture. EXAM: RIGHT ANKLE - COMPLETE 3+ VIEW; DG C-ARM 61-120 MIN COMPARISON:  Right ankle radiographs performed 11/26/2014 FINDINGS: A single fluoroscopic C-arm image is provided from the OR. This demonstrates interval reduction of the previously noted right ankle dislocation, with mild residual displacement of distal fibular and medial malleolar fractures. A  pin is seen overlying the ankle mortise. Surrounding soft tissue swelling is noted. IMPRESSION: Interval reduction of right ankle dislocation, with associated pain overlying the ankle mortise. Electronically Signed   By: Roanna Raider M.D.   On: 11/27/2014 05:13   Dg C-arm 1-60 Min  11/27/2014  CLINICAL DATA:  External fixation of right ankle fracture. EXAM: RIGHT ANKLE - COMPLETE 3+ VIEW; DG C-ARM 61-120 MIN COMPARISON:  Right ankle radiographs performed 11/26/2014 FINDINGS: A single fluoroscopic C-arm image is provided from the OR. This demonstrates interval reduction of the previously noted right ankle dislocation, with mild residual displacement of distal fibular and medial malleolar fractures. A pin is seen overlying the ankle mortise. Surrounding soft tissue swelling is noted. IMPRESSION: Interval reduction of right ankle dislocation, with associated pain overlying the ankle mortise. Electronically Signed   By: Roanna Raider M.D.   On: 11/27/2014 05:13   EKG: Independently reviewed. normal sinus rhythm, nonspecific ST and T waves changes.  Assessment/Plan  1. Ankle fracture, right The patient is status post closed reduction and external fixation of the right ankle. We will follow recommendation from orthopedic surgery for further ambulation as well as PTOT. Next and we will need to discuss with orthopedic surgery in the morning about continuation of patient's anticoagulation.  2.Bilateral pneumonia with probable sepsis. Patient was hypoxic tachycardic and tachypneic in the other facility. Currently she is on 2 L of oxygen. We will continue with vancomycin and Zosyn for treatment of pneumonia as well as cellulitis which was initiated in the other facility. Follow the cultures.  3 elevated troponin. Possible acute on chronic diastolic dysfunction. Check proBNP. Patient's troponin is 2.2. In the other facility at was 0.5-1 11/25/2014. EKG does not show any acute ischemia. Cardiology  consulted. We will give her aspirin. Patient remains nothing by mouth except medication. Most likely due to demand ischemia. Currently holding off on any diuresis under cardiology evaluation.  5  Obesity 6  Sleep apnea Not on any C Pap and continue close monitoring.  7  COPD (chronic obstructive pulmonary disease) (HCC) Patient has bilateral expiratory wheezing on examination unclear whether this is related to COPD flareup. Currently we will use every 6 hours nebulization.  8  Recurrent pulmonary embolism (HCC)   Chronic anticoagulation Patient is on warfarin at home. Patient would like to switch to a lipase. We will need to discuss with orthopedic surgery prior to resuming patient's home and defibrillation.  9  Diabetes mellitus (HCC) Would hold her oral hypoglycemic agent and we will put her on insulin sliding scale.  Nutrition: nothing by mouth except medications  DVT Prophylaxis: subcutaneous Heparin  Advance goals of care discussion:  Full code  Consults: orthopedic surgery  Disposition: Admitted as inpatient, telemetry unit.  Author: Lynden OxfordPranav Curren Mohrmann, MD [facility] Pager: [phone] 11/27/2014  If 7PM-7AM, please contact night-coverage [email or web address] Password [email or web address]

## 2014-11-27 NOTE — Care Management Note (Signed)
Case Management Note  Patient Details  Name: Marta LamasLinda C Piet MRN: [id number] Date of Birth: 1945-04-21  Subjective/Objective:                  Ankle fracture, right The patient is status post closed reduction and external fixation of the right ankle.  Action/Plan: CM spoke to patient at the bedside Re: discharge planning needs. Patient said that she lives at home and does not feel she would have enough support to return to previous living situation at this time and feels she would need a SNF until she can get more mobile and strong. PT eval pending. Patient said that she has a walker, BSC, W/C, and shower chair at home. CM called and spoke to SW [place]wan to advise of possible SNF placement needs. Referral accepted. CM remains available for further discharge planning needs.   Expected Discharge Date:                  Expected Discharge Plan:  Skilled Nursing Facility  In-House Referral:  Clinical Social Work  Discharge planning Services  CM Consult  Post Acute Care Choice:    Choice offered to:     DME Arranged:    DME Agency:     HH Arranged:    HH Agency:     Status of Service:  In process, will continue to follow  Medicare Important Message Given:    Date Medicare IM Given:    Medicare IM give by:    Date Additional Medicare IM Given:    Additional Medicare Important Message give by:     If discussed at Long Length of Stay Meetings, dates discussed:    Additional Comments:  Darcel SmallingAnna C Levon Penning, RN 11/27/2014, 1:16 PM

## 2014-11-27 NOTE — Anesthesia Preprocedure Evaluation (Addendum)
Anesthesia Evaluation  Patient identified by MRN, date of birth, ID band Patient awake    Reviewed: Allergy & Precautions, NPO status , Patient's Chart, lab work & pertinent test results  History of Anesthesia Complications Negative for: history of anesthetic complications  Airway Mallampati: I  TM Distance: >3 FB Neck ROM: Full    Dental  (+) Edentulous Upper, Partial Lower, Poor Dentition   Pulmonary asthma ,    Pulmonary exam normal        Cardiovascular Normal cardiovascular exam     Neuro/Psych    GI/Hepatic   Endo/Other  diabetes, Type 2, Oral Hypoglycemic AgentsMorbid obesity  Renal/GU      Musculoskeletal   Abdominal   Peds  Hematology   Anesthesia Other Findings   Reproductive/Obstetrics                            Anesthesia Physical Anesthesia Plan  ASA: III and emergent  Anesthesia Plan: General   Post-op Pain Management:    Induction: Intravenous  Airway Management Planned: Oral ETT  Additional Equipment:   Intra-op Plan:   Post-operative Plan: Extubation in OR  Informed Consent: I have reviewed the patients History and Physical, chart, labs and discussed the procedure including the risks, benefits and alternatives for the proposed anesthesia with the patient or authorized representative who has indicated his/her understanding and acceptance.   Dental advisory given  Plan Discussed with: Surgeon and CRNA  Anesthesia Plan Comments:        Anesthesia Quick Evaluation

## 2014-11-27 NOTE — Op Note (Signed)
[name], [name]                 ACCOUNT NO.:  [id number]  MEDICAL RECORD NO.:  [id number]  LOCATION:  5N06C                        FACILITY:  [facility]  PHYSICIAN:  Doralee Albino. Carola Frost, M.D. DATE OF BIRTH:  1945/08/07  DATE OF PROCEDURE:  11/27/2014 DATE OF DISCHARGE:                              OPERATIVE REPORT   PREOPERATIVE DIAGNOSES: 1. Right ankle posterior dislocation. 2. Right ankle trimalleolar fracture.  POSTOPERATIVE DIAGNOSES: 1. Right ankle posterior dislocation. 2. Right ankle trimalleolar fracture.  PROCEDURES: 1. Closed reduction of ankle dislocation. 2. Closed reduction of trimalleolar ankle fracture. 3. External fixation of the leg with a monolateral frame for right     ankle fracture.  SURGEON:  Doralee Albino. Carola Frost, M.D.  ASSISTANT: 1. Mearl Latin, PA-C. 2. PA student.  ANESTHESIA:  General.  COMPLICATIONS:  None.  I/O:  1000 mL crystalloid/UOP 200 mL.  DISPOSITION:  To PACU.  CONDITION:  Stable.  BRIEF SUMMARY OF INDICATION FOR PROCEDURE:  [name] is a 69 year old female with multiple medical problems including super morbid obesity. She was [place] to [facility] [place] and treatment was initiated for community-acquired pneumonia.  She improved. She reported severe right ankle pain and x-rays demonstrated a dislocation and fracture that had been present since Friday.  She also complained of some right shoulder pain, but x-rays were negative for fracture, though they demonstrated significant arthritis.  There was no orthopedist coverage available at [facility] and the medical physicians there requested transfer here where she could undergo reduction and the surgical treatment if indicated.  When the patient arrived, I did discuss with her the risks and benefits of surgical repair including the possibility of infection, pin tract infection given the longstanding venous stasis disease, loss of reduction given  the difficulty of any sort of bracing attempt with her body habitus and heart attack, stroke, or other infection nerve injury, vessel injury, loss of motion, posttraumatic arthritis, and many others.  She acknowledged these risks and did wish to proceed.  BRIEF SUMMARY OF PROCEDURE:  The patient was taken to the operating room where general anesthesia was induced.  She did receive preop antibiotics.  Her right ankle, then underwent gentle traction, extension and internal rotation resulting in reduction of the ankle joint.  We then performed a standard prep and drape protecting the reduction throughout.  The trimalleolar fracture was then manipulated to produce a reduction of these fragments on the lateral, medial and posterior aspects.  Once this was done, I did feel that I could hold it acceptably with splint and mold, and consequently, I felt that application of spanning external fixator would provide the best support.  I then placed two pins in the anterior tibia followed by transcalcaneal pin, being careful to check my projectory.  We did have considerable soft tissue concerns during this placement and adjusted the position accordingly.  I then pulled the traction on the tibial pin, again defined the trimalleolar component of the patient's injury and after this was completed, I placed a 2.8-mm K-wire from the calcaneus into the talus and across the tibia.  Final images confirmed appropriate reduction and position of the pin fixation.  The pin was bent as it protruded from the footpad and a gauze was placed to protect the skin.  The pins were wrapped with Kerlix and add Ace wrap from foot to knee.  [name] MoritaKeith Paul, PA- C assisted me throughout and his assistant was absolutely necessary.  He had pulled traction through the calcaneal pin, he had secured the pins and bores.  We also had a second Geophysicist/field seismologistassistant, PA student.  PROGNOSIS:  [name] remains at very high risk for  complications including loss of reduction given her body habitus and multiple comorbidities, pin tract infection, loss of motion, loss of the ability to ambulate as this will require 2 months for healing.  We will certainly evaluate the postoperative films as she may end up requiring a subsequent surgical fixation once her soft tissue swelling has resolved somewhat.  The subacute nature of her dislocation increases the likelihood of articular injury in addition to persistent instability and will be a factor that we follow her as well.  We do anticipate a continuance on our blood thinner.     Doralee AlbinoMichael H. Carola FrostHandy, M.D.     MHH/MEDQ  D:  11/27/2014  T:  11/27/2014  Job:  [id number]

## 2014-11-27 NOTE — Anesthesia Procedure Notes (Signed)
Procedure Name: Intubation Date/Time: 11/27/2014 12:41 AM Performed by: Arlice ColtMANESS, Idalys Konecny B Pre-anesthesia Checklist: Patient identified, Emergency Drugs available, Suction available, Patient being monitored and Timeout performed Patient Re-evaluated:Patient Re-evaluated prior to inductionOxygen Delivery Method: Circle system utilized Preoxygenation: Pre-oxygenation with 100% oxygen Intubation Type: IV induction and Rapid sequence Laryngoscope Size: Mac and 3 Grade View: Grade I Tube type: Oral Tube size: 7.5 mm Number of attempts: 1 Airway Equipment and Method: Stylet Placement Confirmation: ETT inserted through vocal cords under direct vision,  positive ETCO2 and breath sounds checked- equal and bilateral Secured at: 21 cm Tube secured with: Tape Dental Injury: Teeth and Oropharynx as per pre-operative assessment

## 2014-11-27 NOTE — Progress Notes (Signed)
ANTIBIOTIC CONSULT NOTE - INITIAL  Pharmacy Consult for Vancomycin/Zosyn  Indication: rule out pneumonia and rule out sepsis  Allergies  Allergen Reactions  . Fentanyl     fainted  . Talwin [Pentazocine]     Extreme hot feeling   Patient Measurements: Height: 5\' 2"  (157.5 cm) Weight: (!) 341 lb 9.6 oz (154.949 kg) IBW/kg (Calculated) : 50.1 Vital Signs: Temp: 98.3 F (36.8 C) (10/30 0601) Temp Source: Oral (10/30 0601) BP: 121/73 mmHg (10/30 0601) Pulse Rate: 88 (10/30 0601) Intake/Output from previous day: 10/29 0701 - 10/30 0700 In: 1340 [P.O.:240; I.V.:1100] Out: 410 [Urine:400; Blood:10] Intake/Output from this shift: Total I/O In: 1340 [P.O.:240; I.V.:1100] Out: 410 [Urine:400; Blood:10]  Labs:  Recent Labs  11/27/14 0445  WBC 14.8*  HGB 12.2  PLT 214  CREATININE 0.86   Estimated Creatinine Clearance: 89.7 mL/min (by C-G formula based on Cr of 0.86). No results for input(s): VANCOTROUGH, VANCOPEAK, VANCORANDOM, GENTTROUGH, GENTPEAK, GENTRANDOM, TOBRATROUGH, TOBRAPEAK, TOBRARND, AMIKACINPEAK, AMIKACINTROU, AMIKACIN in the last 72 hours.   Microbiology: Recent Results (from the past 720 hour(s))  Surgical pcr screen     Status: None   Collection Time: 11/27/14 12:05 AM  Result Value Ref Range Status   MRSA, PCR NEGATIVE NEGATIVE Final   Staphylococcus aureus NEGATIVE NEGATIVE Final    Comment:        The Xpert SA Assay (FDA approved for NASAL specimens in patients over 69 years of age), is one component of a comprehensive surveillance program.  Test performance has been validated by [facility] for patients greater than or equal to 69 year old. It is not intended to diagnose infection nor to guide or monitor treatment.     Medical History: Past Medical History  Diagnosis Date  . Ankle fracture, right 11/27/2014  . Dislocation of right ankle joint 11/27/2014   Assessment: 69 [facility] F [facility], vanco/zosyn  for r/o sepsis/PNA, WBC mildly elevated, renal function appears age appropriate, other labs as above.   Goal of Therapy:  Vancomycin trough level 15-20 mcg/ml  Plan:  -Vancomycin 1250 mg IV q12h  -Zosyn 3.375G IV q8h to be infused over 4 hours -Trend WBC, temp, renal function  -Drug levels as indicated   Abran DukeLedford, Charletta Voight 11/27/2014,6:58 AM

## 2014-11-27 NOTE — Consult Note (Signed)
CARDIOLOGY INPATIENT CONSULTATION NOTE  Patient ID: [name] LamasLinda C [name] MRN: [id number], DOB/AGE: [birth year]   Admit date: 11/26/2014   Primary Physician: No primary care provider on file. Primary Cardiologist: Lewayne BuntingGuy DeGent MD/none   Reason for Consult:   Elevated troponin  Requesting Physician: Lynden OxfordPranav Patel MD   HPI: This is a 69 y.o.white female with no known history of CAD but has risk factors (hypertension, DM2, HLD), who initially presented for ankle fracture. Post op she felt SOb and her troponin was elevated to 2. Patient is admitted under triad hospitalist service and is being managed for ankle pain/post orthopedic care. Patient denied having exertional chest pain, dyspnea, syncope, leg swelling otherwise. She had a cardioloite stress test in 2008 which was negative.  Patient had a fall last Thursday and had ankle/tibia fracture. [facility] where her initial troponin was elevated. [facility] and had urgent surgery performed in the morning. She has asthma and is not feeling more SOB than her usual feeling. She has chronic leg swelling since her knee replacements. She denied chest pain, dizziness, lightheadedness, syncope, presyncope otherwise. She lives alone. Does not drive. She walks with a walker.   Cardiolite stress test 2008 - no perfusion evidence of ischemia/infarction withnormal left ventricular function (EF 62%). Problem List: Past Medical History  Diagnosis Date  . Ankle fracture, right 11/27/2014  . Dislocation of right ankle joint 11/27/2014    History reviewed. No pertinent past surgical history.   Allergies:  Allergies  Allergen Reactions  . Fentanyl     fainted  . Talwin [Pentazocine]     Extreme hot feeling     Home Medications Current Facility-Administered Medications  Medication Dose Route Frequency Provider Last Rate Last Dose  . acetaminophen (TYLENOL) tablet 650 mg  650 mg Oral Q6H PRN Montez MoritaKeith Paul,  PA-C       Or  . acetaminophen (TYLENOL) suppository 650 mg  650 mg Rectal Q6H PRN Montez MoritaKeith Paul, PA-C      . aspirin EC tablet 325 mg  325 mg Oral Daily Rolly SalterPranav M Patel, MD      . budesonide-formoterol (SYMBICORT) 160-4.5 MCG/ACT inhaler 2 puff  2 puff Inhalation BID Rolly SalterPranav M Patel, MD      . Melene Muller[START ON 11/28/2014] enoxaparin (LOVENOX) injection 40 mg  40 mg Subcutaneous Q24H Montez MoritaKeith Paul, PA-C      . ferrous fumarate (HEMOCYTE - 106 mg FE) tablet 106 mg of iron  1 tablet Oral Daily Rolly SalterPranav M Patel, MD      . HYDROcodone-acetaminophen [facility](NORCO) 7.5-325 MG per tablet 1-2 tablet  1-2 tablet Oral Q6H PRN Montez MoritaKeith Paul, PA-C      . HYDROmorphone (DILAUDID) injection 0.25-0.5 mg  0.25-0.5 mg Intravenous Q5 min PRN Arta BruceKevin Ossey, MD      . insulin aspart (novoLOG) injection 0-15 Units  0-15 Units Subcutaneous 4 times per day Rolly SalterPranav M Patel, MD      . ipratropium-albuterol (DUONEB) 0.5-2.5 (3) MG/3ML nebulizer solution 3 mL  3 mL Nebulization Q6H Rolly SalterPranav M Patel, MD      . menthol-cetylpyridinium (CEPACOL) lozenge 3 mg  1 lozenge Oral PRN Rolly SalterPranav M Patel, MD      . meperidine (DEMEROL) injection 6.25-12.5 mg  6.25-12.5 mg Intravenous Q5 min PRN Arta BruceKevin Ossey, MD      . methocarbamol (ROBAXIN) tablet 500 mg  500 mg Oral Q6H PRN Montez MoritaKeith Paul, PA-C   500 mg at 11/27/14 0522   Or  .  methocarbamol (ROBAXIN) 500 mg in dextrose 5 % 50 mL IVPB  500 mg Intravenous Q6H PRN Montez Morita, PA-C      . metoCLOPramide (REGLAN) tablet 5-10 mg  5-10 mg Oral Q8H PRN Montez Morita, PA-C       Or  . metoCLOPramide (REGLAN) injection 5-10 mg  5-10 mg Intravenous Q8H PRN Montez Morita, PA-C      . mirabegron ER [facility]) tablet 25 mg  25 mg Oral Daily Rolly Salter, MD      . morphine 2 MG/ML injection 1-2 mg  1-2 mg Intravenous Q2H PRN Montez Morita, PA-C   2 mg at 11/27/14 0522  . ondansetron (ZOFRAN) injection 4 mg  4 mg Intravenous Once PRN Arta Bruce, MD      . ondansetron [facility]) tablet 4 mg  4 mg Oral Q6H PRN Montez Morita, PA-C       Or  .  ondansetron [facility]) injection 4 mg  4 mg Intravenous Q6H PRN Montez Morita, PA-C      . pantoprazole (PROTONIX) EC tablet 40 mg  40 mg Oral Daily Rolly Salter, MD      . piperacillin-tazobactam (ZOSYN) IVPB 3.375 g  3.375 g Intravenous 3 times per day Stevphen Rochester, RPH      . vancomycin (VANCOCIN) 1,250 mg in sodium chloride 0.9 % 250 mL IVPB  1,250 mg Intravenous Q12H Stevphen Rochester, RPH         History reviewed. No pertinent family history.   Social History   Social History  . Marital Status: Widowed    Spouse Name: N/A  . Number of Children: N/A  . Years of Education: N/A   Occupational History  . Not on file.   Social History Main Topics  . Smoking status: Not on file  . Smokeless tobacco: Not on file  . Alcohol Use: Not on file  . Drug Use: Not on file  . Sexual Activity: Not on file   Other Topics Concern  . Not on file   Social History Narrative  . No narrative on file     Review of Systems: General: fatigue increase weight negative for chills, fever, night sweats  Cardiovascular: leg edema, dyspnea but no chest pain  Dermatological: rash on the leg Respiratory: productive cough negative for wheezing  Urologic: negative for hematuria Abdominal: negative for nausea, vomiting, diarrhea, bright red blood per rectum, melena, or hematemesis Neurologic: negative for visual changes, syncope, or dizziness Hematology:  Anemia, taking iron Psychiatry: non suicidal/homicidal  Musculoskeletal: back pain, several back surgeries and knee surgery/replacement, ankle fracture  Physical Exam: Vitals: BP 121/73 mmHg  Pulse 88  Temp(Src) 98.3 F (36.8 C) (Oral)  Resp 16  Ht  (1.575 m)  Wt 154.949 kg (341 lb 9.6 oz)  BMI 62.46 kg/m2  SpO2 94% General: not in acute distress Neck: JVP elevated, neck supple Heart: regular rate and rhythm, S1, S2, musical quality soft systolic ejection murmur in aortic region of grade II/VI intensity and grade II/VI systolic murmur  at PMI radiating the axilla Lungs: mild crackles present bilaterally GI: non tender, distended, bowel sounds present Extremities: 3+ edema, fixator on the right ankle Neuro: AAO x 3  Psych: normal affect, no anxiety   Labs:   Results for orders placed or performed during the hospital encounter of 11/26/14 (from the past 24 hour(s))  Glucose, capillary     Status: Abnormal   Collection Time: 11/26/14 11:48 PM  Result Value Ref Range  Glucose-Capillary 123 (H) 65 - 99 mg/dL  Surgical pcr screen     Status: None   Collection Time: 11/27/14 12:05 AM  Result Value Ref Range   MRSA, PCR NEGATIVE NEGATIVE   Staphylococcus aureus NEGATIVE NEGATIVE  Troponin I (q 6hr x 3)     Status: Abnormal   Collection Time: 11/27/14  4:43 AM  Result Value Ref Range   Troponin I 2.12 (HH) <0.031 ng/mL  Protime-INR     Status: Abnormal   Collection Time: 11/27/14  4:43 AM  Result Value Ref Range   Prothrombin Time 20.3 (H) 11.6 - 15.2 seconds   INR 1.74 (H) 0.00 - 1.49  APTT     Status: Abnormal   Collection Time: 11/27/14  4:43 AM  Result Value Ref Range   aPTT 47 (H) 24 - 37 seconds  Comprehensive metabolic panel     Status: Abnormal   Collection Time: 11/27/14  4:45 AM  Result Value Ref Range   Sodium 133 (L) 135 - 145 mmol/L   Potassium 5.3 (H) 3.5 - 5.1 mmol/L   Chloride 96 (L) 101 - 111 mmol/L   CO2 27 22 - 32 mmol/L   Glucose, Bld 155 (H) 65 - 99 mg/dL   BUN 16 6 - 20 mg/dL   Creatinine, Ser 5.78 0.44 - 1.00 mg/dL   Calcium 8.4 (L) 8.9 - 10.3 mg/dL   Total Protein 5.5 (L) 6.5 - 8.1 g/dL   Albumin 2.6 (L) 3.5 - 5.0 g/dL   AST 56 (H) 15 - 41 U/L   ALT 29 14 - 54 U/L   Alkaline Phosphatase 64 38 - 126 U/L   Total Bilirubin 1.6 (H) 0.3 - 1.2 mg/dL   GFR calc non Af Amer >60 >60 mL/min   GFR calc Af Amer >60 >60 mL/min   Anion gap 10 5 - 15  CBC with Differential/Platelet     Status: Abnormal   Collection Time: 11/27/14  4:45 AM  Result Value Ref Range   WBC 14.8 (H) 4.0 - 10.5  K/uL   RBC 4.44 3.87 - 5.11 MIL/uL   Hemoglobin 12.2 12.0 - 15.0 g/dL   HCT 46.9 62.9 - 52.8 %   MCV 91.0 78.0 - 100.0 fL   MCH 27.5 26.0 - 34.0 pg   MCHC 30.2 30.0 - 36.0 g/dL   RDW 41.3 24.4 - 01.0 %   Platelets 214 150 - 400 K/uL   Neutrophils Relative % 85 %   Neutro Abs 12.4 (H) 1.7 - 7.7 K/uL   Lymphocytes Relative 6 %   Lymphs Abs 1.0 0.7 - 4.0 K/uL   Monocytes Relative 7 %   Monocytes Absolute 1.1 (H) 0.1 - 1.0 K/uL   Eosinophils Relative 2 %   Eosinophils Absolute 0.3 0.0 - 0.7 K/uL   Basophils Relative 0 %   Basophils Absolute 0.0 0.0 - 0.1 K/uL  TSH     Status: Abnormal   Collection Time: 11/27/14  4:45 AM  Result Value Ref Range   TSH 9.347 (H) 0.350 - 4.500 uIU/mL  Prealbumin     Status: Abnormal   Collection Time: 11/27/14  4:45 AM  Result Value Ref Range   Prealbumin 9.0 (L) 18 - 38 mg/dL  Magnesium     Status: None   Collection Time: 11/27/14  4:45 AM  Result Value Ref Range   Magnesium 1.7 1.7 - 2.4 mg/dL  Phosphorus     Status: None   Collection Time: 11/27/14  4:45 AM  Result Value Ref Range   Phosphorus 3.8 2.5 - 4.6 mg/dL  Glucose, capillary     Status: Abnormal   Collection Time: 11/27/14  5:30 AM  Result Value Ref Range   Glucose-Capillary 149 (H) 65 - 99 mg/dL     Radiology/Studies: Dg Ankle Complete Right  11/27/2014  CLINICAL DATA:  External fixation of right ankle fracture. EXAM: RIGHT ANKLE - COMPLETE 3+ VIEW; DG C-ARM 61-120 MIN COMPARISON:  Right ankle radiographs performed 11/26/2014 FINDINGS: A single fluoroscopic C-arm image is provided from the OR. This demonstrates interval reduction of the previously noted right ankle dislocation, with mild residual displacement of distal fibular and medial malleolar fractures. A pin is seen overlying the ankle mortise. Surrounding soft tissue swelling is noted. IMPRESSION: Interval reduction of right ankle dislocation, with associated pain overlying the ankle mortise. Electronically Signed   By: Roanna Raider M.D.   On: 11/27/2014 05:13   Dg Chest Port 1 View  11/27/2014  CLINICAL DATA:  Acute onset of shortness of breath, cough and congestion. Initial encounter. EXAM: PORTABLE CHEST 1 VIEW COMPARISON:  Chest radiograph performed 11/24/2014 FINDINGS: Previously noted pulmonary edema has improved. Residual mildly increased interstitial markings are noted. Vascular congestion is seen. No pleural effusion or pneumothorax is identified. The cardiomediastinal silhouette is borderline normal in size. No acute osseous abnormalities are identified. IMPRESSION: Interval improvement in pulmonary edema, though residual mildly increased interstitial markings are noted, with underlying vascular congestion. Electronically Signed   By: Roanna Raider M.D.   On: 11/27/2014 06:58   Dg Ankle Right Port  11/27/2014  CLINICAL DATA:  Postop external fixation of RIGHT ankle fracture EXAM: PORTABLE RIGHT ANKLE - 2 VIEW COMPARISON:  11/26/2014 FINDINGS: External fixator device applied with large K-wire extending through the talus into the tibia. There is interval reduction of the fracture dislocation. Trimalleolar fracture noted. IMPRESSION: 1. Interval reduction of the fracture dislocation with external fixator. 2. Trimalleolar fracture Electronically Signed   By: Genevive Bi M.D.   On: 11/27/2014 07:17   Dg C-arm 1-60 Min  11/27/2014  CLINICAL DATA:  External fixation of right ankle fracture. EXAM: RIGHT ANKLE - COMPLETE 3+ VIEW; DG C-ARM 61-120 MIN COMPARISON:  Right ankle radiographs performed 11/26/2014 FINDINGS: A single fluoroscopic C-arm image is provided from the OR. This demonstrates interval reduction of the previously noted right ankle dislocation, with mild residual displacement of distal fibular and medial malleolar fractures. A pin is seen overlying the ankle mortise. Surrounding soft tissue swelling is noted. IMPRESSION: Interval reduction of right ankle dislocation, with associated pain overlying the  ankle mortise. Electronically Signed   By: Roanna Raider M.D.   On: 11/27/2014 05:13    EKG: today normal sinus rhythm, lateral T wave changes  Echo: ordered  Cardiac cath: ordered  Medical decision making:  Discussed care with the patient Discussed care with the physician on the phone Reviewed labs and imaging personally Reviewed prior records  ASSESSMENT AND PLAN:  This is a 69 y.o. female with no known history of CAD but has risk factors (hypertension, DM2, HLD), who initially presented for ankle fracture. Cardiology consulted for asymptomatic elevated troponin.   Principal Problem:   Ankle fracture, right Active Problems:   Dislocation of right ankle joint   Bilateral pneumonia   Acute on chronic diastolic CHF (congestive heart failure) (HCC)   Elevated troponin   Obesity   Sleep apnea   COPD (chronic obstructive pulmonary disease) (HCC)   Chronic venous insufficiency   Recurrent  pulmonary embolism (HCC)   Chronic anticoagulation   Diabetes mellitus (HCC)  Acute on chronic diastolic heart failure with pulmonary edema, on room air One dose of IV lasix given and reassess later, echocardiogram ordered Elevated troponin in the setting of heart failure, possible silent MI Will treat medically with aspirin, plavix, discuss with surgery to increase dose of A/C to treatment dose lovenox or IV heparin, and high dose statin. Evaluate LVEF, if decreased, may need invasive evaluation Sleep apnea not on CPAP since it made her asthma worse Recurrent PE on chronic anticoagulation - on anticoagulation   Signed, Joellyn Rued, MD MS 11/27/2014, 7:23 AM

## 2014-11-27 NOTE — Progress Notes (Addendum)
ANTICOAGULATION CONSULT NOTE - Initial Consult  Pharmacy Consult for Enoxaparin Indication:  Hx of PE  Allergies  Allergen Reactions  . Fentanyl     fainted  . Talwin [Pentazocine]     Extreme hot feeling    Patient Measurements: Height: 5\' 2"  (157.5 cm) Weight: (!) 341 lb 9.6 oz (154.949 kg) IBW/kg (Calculated) : 50.1  Vital Signs: Temp: 98.3 F (36.8 C) (10/30 0601) Temp Source: Oral (10/30 0601) BP: 121/73 mmHg (10/30 0601) Pulse Rate: 88 (10/30 0601)  Labs:  Recent Labs  11/27/14 0443 11/27/14 0445  HGB  --  12.2  HCT  --  40.4  PLT  --  214  APTT 47*  --   LABPROT 20.3*  --   INR 1.74*  --   CREATININE  --  0.86  TROPONINI 2.12*  --     Estimated Creatinine Clearance: 89.7 mL/min (by C-G formula based on Cr of 0.86).   Medical History: Past Medical History  Diagnosis Date  . Ankle fracture, right 11/27/2014  . Dislocation of right ankle joint 11/27/2014    Assessment:  69 y/o F w/ ankle fracture. Pt has hx of PE and pharmacy is consulted to dose Lovenox at a therapeutic dose for this indication. Hgb 12.2, plts 214, CrCl ~ 90 mL/min. Pt is obese weighing 154.9 kg. Pt on warfarin PTA but on hold here.   Goal of Therapy:  HL 0.6 - 1 unit/mL Monitor platelets by anticoagulation protocol: Yes   Plan:  Enoxaparin SQ 150 mg q12h (1 mg/kg q12h) Monitor CBC, S&S of bleed Check [facility]  Sandi CarneNick Voncile Schwarz, PharmD Pharmacy Resident Pager: [phone] 11/27/2014,10:46 AM

## 2014-11-27 NOTE — Progress Notes (Signed)
Patient to OR for right ankle fx.  CHG bath done; surgical PCR done.

## 2014-11-27 NOTE — Brief Op Note (Signed)
11/26/2014 - 11/27/2014  2:09 AM  PATIENT:  [name]  69 y.o. female  PRE-OPERATIVE DIAGNOSIS:  right ankle trimalleolar fracture dislocation, subacute  POST-OPERATIVE DIAGNOSIS:  right ankle trimalleolar fracture dislocation, subacute  PROCEDURE:  Procedure(s): 1. CLOSED REDUCTION OF ANKLE DISLOCATION 2. CLOSED REDUCTION OF TRIMALLEOLAR ANKLE FRACTURE  3. EXTERNAL FIXATION LEG FOR RIGHT ANKLE FRACTURE (Right)  SURGEON:  Surgeon(s) and Role:    * Myrene GalasMichael Khya Halls, MD - Primary  PHYSICIAN ASSISTANT: 1. Montez MoritaKeith Paul, PA-C; 2. PA Student  ANESTHESIA:   general  I/O:  Total I/O In: 1000 [I.V.:1000] Out: 200 [Urine:200]  SPECIMEN:  No Specimen  TOURNIQUET:    DICTATION: .Other Dictation: Dictation Number [id number]

## 2014-11-27 NOTE — Progress Notes (Addendum)
Patient seen and examined, vital stable, aaox3, reported right ankle pain requiring pnr pain med,reported chronic bilateral lower extremity edema, redness, weeping, mild intermittent dry cough noticed during encounter,exam:  lung diminished, no wheezing, no rhonchi, no rales, RRR, ab soft, extremity right lower extremity postop changes. Chronic venous stasis change, skin hard, dark red, no obvious open wound.  Elevation of troponin: cardiology input appreciated, echo pending, on asa 325/plavix, lasix, echo pending,  Right ankle fracture: s/p closed reduction and external fication on 10/30, and ortho following. Ortho to decide when to start anticoagulation. Leukocytosis: ua pending, cxr more pulmonary edema but could not r/o pna, lower extremity cellulitis vs chronic changes? Culture pending, on vanc/zosyn. Family in room , updated.  Addendum: h/p PE, on coumadin at home, talked to ortho Dr. Carola FrostHandy, he is oked with restarting anticoagulation, lovenox therapeutic dose per pharmacy ordered.

## 2014-11-27 NOTE — Progress Notes (Signed)
PT Cancellation Note  Patient Details Name: Marta LamasLinda C Franken MRN: [id number] DOB: [birth year]   Cancelled Treatment:    Reason Eval/Treat Not Completed: Medical issues which prohibited therapy (Elevated Tropinin) Noted elevated Troponin levels.  Will await levels to trend down and medical clearance to begin PT. Given pts NWB status and obesity it will likely require high physical exertion when begining to mobilize on her feet.  Donnella ShamSawulski, Robinson Brinkley J 11/27/2014, 8:16 AM  Lavona MoundMark Anet Logsdon, PT  [phone] 11/27/2014

## 2014-11-27 NOTE — Progress Notes (Signed)
Patient back from OR to 5N06 at 0300. Patient alert and oriented x 4.

## 2014-11-28 ENCOUNTER — Inpatient Hospital Stay (HOSPITAL_COMMUNITY): Payer: Medicare Other

## 2014-11-28 ENCOUNTER — Encounter (HOSPITAL_COMMUNITY): Payer: Self-pay | Admitting: Orthopedic Surgery

## 2014-11-28 DIAGNOSIS — I214 Non-ST elevation (NSTEMI) myocardial infarction: Secondary | ICD-10-CM

## 2014-11-28 LAB — CBC
HCT: 40.5 % (ref 36.0–46.0)
Hemoglobin: 12 g/dL (ref 12.0–15.0)
MCH: 27.2 pg (ref 26.0–34.0)
MCHC: 29.6 g/dL — ABNORMAL LOW (ref 30.0–36.0)
MCV: 91.8 fL (ref 78.0–100.0)
PLATELETS: 235 10*3/uL (ref 150–400)
RBC: 4.41 MIL/uL (ref 3.87–5.11)
RDW: 15.4 % (ref 11.5–15.5)
WBC: 12.4 10*3/uL — AB (ref 4.0–10.5)

## 2014-11-28 LAB — GLUCOSE, CAPILLARY
GLUCOSE-CAPILLARY: 146 mg/dL — AB (ref 65–99)
GLUCOSE-CAPILLARY: 134 mg/dL — AB (ref 65–99)
GLUCOSE-CAPILLARY: 158 mg/dL — AB (ref 65–99)
Glucose-Capillary: 183 mg/dL — ABNORMAL HIGH (ref 65–99)

## 2014-11-28 LAB — LIPID PANEL
CHOL/HDL RATIO: 4 ratio
CHOLESTEROL: 103 mg/dL (ref 0–200)
HDL: 26 mg/dL — ABNORMAL LOW (ref >40)
LDL Cholesterol: 59 mg/dL (ref 0–99)
Triglycerides: 92 mg/dL (ref <150)
VLDL: 18 mg/dL (ref 0–40)

## 2014-11-28 LAB — COMPREHENSIVE METABOLIC PANEL
ALBUMIN: 2.6 g/dL — AB (ref 3.5–5.0)
ALT: 19 U/L (ref 14–54)
AST: 32 U/L (ref 15–41)
Alkaline Phosphatase: 58 U/L (ref 38–126)
Anion gap: 8 (ref 5–15)
BUN: 14 mg/dL (ref 6–20)
CHLORIDE: 98 mmol/L — AB (ref 101–111)
CO2: 34 mmol/L — ABNORMAL HIGH (ref 22–32)
Calcium: 8.7 mg/dL — ABNORMAL LOW (ref 8.9–10.3)
Creatinine, Ser: 0.92 mg/dL (ref 0.44–1.00)
GFR calc Af Amer: >60 mL/min (ref >60)
GFR calc non Af Amer: >60 mL/min (ref >60)
GLUCOSE: 172 mg/dL — AB (ref 65–99)
POTASSIUM: 3.9 mmol/L (ref 3.5–5.1)
SODIUM: 140 mmol/L (ref 135–145)
Total Bilirubin: 1 mg/dL (ref 0.3–1.2)
Total Protein: 5.9 g/dL — ABNORMAL LOW (ref 6.5–8.1)

## 2014-11-28 LAB — LEGIONELLA PNEUMOPHILA SEROGP 1 UR AG: L. pneumophila Serogp 1 Ur Ag: NEGATIVE

## 2014-11-28 LAB — PTH, INTACT AND CALCIUM
Calcium, Total (PTH): 8.6 mg/dL — ABNORMAL LOW (ref 8.7–10.3)
PTH: 39 pg/mL (ref 15–65)

## 2014-11-28 LAB — HEMOGLOBIN A1C
HEMOGLOBIN A1C: 7.3 % — AB (ref 4.8–5.6)
Mean Plasma Glucose: 163 mg/dL

## 2014-11-28 LAB — CALCIUM, IONIZED: Calcium, Ionized, Serum: 4.8 mg/dL (ref 4.5–5.6)

## 2014-11-28 LAB — HEPARIN ANTI-XA: Heparin LMW: 0.79 IU/mL

## 2014-11-28 LAB — VITAMIN D 25 HYDROXY (VIT D DEFICIENCY, FRACTURES): Vit D, 25-Hydroxy: 31.6 ng/mL (ref 30.0–100.0)

## 2014-11-28 LAB — T4, FREE: Free T4: 1.05 ng/dL (ref 0.61–1.12)

## 2014-11-28 MED ORDER — WARFARIN SODIUM 4 MG PO TABS
4.0000 mg | ORAL_TABLET | Freq: Once | ORAL | Status: DC
  Filled 2014-11-28: qty 1

## 2014-11-28 MED ORDER — IPRATROPIUM-ALBUTEROL 0.5-2.5 (3) MG/3ML IN SOLN: 3.0000 mL | RESPIRATORY_TRACT | Status: DC | PRN

## 2014-11-28 MED ORDER — METOPROLOL TARTRATE 12.5 MG HALF TABLET
12.5000 mg | ORAL_TABLET | Freq: Two times a day (BID) | ORAL | Status: DC
Start: 2014-11-28 — End: 2014-11-30
  Administered 2014-11-28 – 2014-11-30 (×5): 12.5 mg via ORAL
  Filled 2014-11-28 (×5): qty 1

## 2014-11-28 MED ORDER — DEXTROSE 5 % IV SOLN
1.0000 g | INTRAVENOUS | Status: DC
  Administered 2014-11-28 – 2014-11-29 (×2): 1 g via INTRAVENOUS
  Filled 2014-11-28 (×3): qty 10

## 2014-11-28 MED ORDER — WARFARIN SODIUM 6 MG PO TABS
6.0000 mg | ORAL_TABLET | Freq: Once | ORAL | Status: AC
  Administered 2014-11-28: 6 mg via ORAL
  Filled 2014-11-28: qty 1

## 2014-11-28 MED ORDER — CLOPIDOGREL BISULFATE 75 MG PO TABS
75.0000 mg | ORAL_TABLET | Freq: Every day | ORAL | Status: DC
  Administered 2014-11-29 – 2014-11-30 (×2): 75 mg via ORAL
  Filled 2014-11-28 (×2): qty 1

## 2014-11-28 MED ORDER — WARFARIN - PHARMACIST DOSING INPATIENT: Freq: Every day | Status: DC

## 2014-11-28 MED ORDER — IPRATROPIUM-ALBUTEROL 0.5-2.5 (3) MG/3ML IN SOLN
3.0000 mL | Freq: Three times a day (TID) | RESPIRATORY_TRACT | Status: DC
  Administered 2014-11-28 – 2014-11-30 (×6): 3 mL via RESPIRATORY_TRACT
  Filled 2014-11-28 (×6): qty 3

## 2014-11-28 MED ORDER — DEXTROSE 5 % IV SOLN
500.0000 mg | INTRAVENOUS | Status: DC
  Administered 2014-11-28: 500 mg via INTRAVENOUS
  Filled 2014-11-28 (×2): qty 500

## 2014-11-28 NOTE — Evaluation (Signed)
Physical Therapy Evaluation Patient Details Name: [name] LamasLinda C [name] MRN: [id number] DOB: [birth year] Today's Date: 11/28/2014   History of Present Illness  [name] is a 69 y.o. female with Past medical history of obesity, hypertension, sleep apnea, dyslipidemia, chronic venous insufficiency, diastolic dysfunction, recurrent pulmonary embolism on chronic anticoagulation. Who fell at home and 2 days later presented with ankle pain with fx. S/p ex fix placement 10/30  Clinical Impression  Pt pleasant and eager to try to get OOB but also fearful of movement. Pt with decreased strength, balance, activity tolerance and assist at home who is currently unable to stand or transfer without 2 person assist. Pt will benefit from acute therapy to maximize mobility, strength and function to decrease burden of care.     Follow Up Recommendations SNF;Supervision/Assistance - 24 hour    Equipment Recommendations  Wheelchair (measurements PT);3in1 (PT);Wheelchair cushion (measurements PT) (bariatric)    Recommendations for Other Services OT consult     Precautions / Restrictions Precautions Precautions: Fall Restrictions Weight Bearing Restrictions: Yes RLE Weight Bearing: Non weight bearing      Mobility  Bed Mobility Overal bed mobility: Needs Assistance;+2 for physical assistance;+ 2 for safety/equipment Bed Mobility: Supine to Sit;Sit to Supine     Supine to sit: Min assist;HOB elevated Sit to supine: Max assist;+2 for safety/equipment   General bed mobility comments: pt able to pivot to EOB with min assist, HOB 40degrees, heavy use of rail and assist to move RLE. With return to bed pt unable to scoot or provide significant assist with max assist to return to supine. Mod assist to roll bil to place pads under pt and Max +2 in trendelenburg to scoot to [facility]  Transfers Overall transfer level: Needs assistance               General transfer comment: Attempted to stand x 2 from elevated  bed with +2 but pt unable with NWB status maintained on RLE. Will require lift for OOB currently  Ambulation/Gait                Stairs            Wheelchair Mobility    Modified Rankin (Stroke Patients Only)       Balance Overall balance assessment: Needs assistance   Sitting balance-Leahy Scale: Fair                                       Pertinent Vitals/Pain Pain Assessment: 0-10 Pain Score: 4  Pain Location: right arm and leg Pain Descriptors / Indicators: Aching Pain Intervention(s): Repositioned;Limited activity within patient's tolerance;Monitored during session    Home Living Family/patient expects to be discharged to:: Private residence Living Arrangements: Alone   Type of Home: Apartment Environmental manager(Senior apartment)       Home Layout: One level Home Equipment: Environmental consultantWalker - 2 wheels      Prior Function Level of Independence: Independent with assistive device(s)         Comments: Pt was walking with walker, caring for herself and living alone. She did not wear oxygen     Hand Dominance        Extremity/Trunk Assessment   Upper Extremity Assessment: Generalized weakness           Lower Extremity Assessment: Generalized weakness      Cervical / Trunk Assessment: Kyphotic  Communication   Communication: No difficulties  Cognition Arousal/Alertness: Awake/alert Behavior During Therapy: WFL for tasks assessed/performed Overall Cognitive Status: Within Functional Limits for tasks assessed                      General Comments      Exercises General Exercises - Lower Extremity Long Arc Quad: AROM;Seated;Both;10 reps Hip Flexion/Marching: AROM;Seated;Both;10 reps      Assessment/Plan    PT Assessment Patient needs continued PT services  PT Diagnosis Difficulty walking;Generalized weakness;Acute pain   PT Problem List Decreased strength;Decreased activity tolerance;Decreased balance;Decreased  mobility;Pain;Obesity;Decreased knowledge of use of DME;Decreased safety awareness  PT Treatment Interventions DME instruction;Functional mobility training;Therapeutic activities;Therapeutic exercise;Balance training;Patient/family education   PT Goals (Current goals can be found in the Care Plan section) Acute Rehab PT Goals Patient Stated Goal: be able to get OOB PT Goal Formulation: With patient Time For Goal Achievement: 12/12/14 Potential to Achieve Goals: Fair    Frequency Min 3X/week   Barriers to discharge Decreased caregiver support      Co-evaluation               End of Session Equipment Utilized During Treatment: Gait belt Activity Tolerance: Patient tolerated treatment well Patient left: in bed;with call bell/phone within reach;with nursing/sitter in room Nurse Communication: Mobility status;Precautions;Weight bearing status;Need for lift equipment         Time: [phone] PT Time Calculation (min) (ACUTE ONLY): 29 min   Charges:   PT Evaluation $Initial PT Evaluation Tier I: 1 Procedure PT Treatments $Therapeutic Activity: 8-22 mins   PT G Codes:        Delorse Lek 11/28/2014, 9:51 AM Delaney Meigs, PT [phone]

## 2014-11-28 NOTE — Progress Notes (Signed)
Occupational Therapy Treatment Patient Details Name: [name] LamasLinda C Dresden MRN: [id number] DOB: 24-Sep-1945 Today's Date: 11/28/2014    History of present illness [name] LamasLinda C Flynt is a 69 y.o. female with Past medical history of obesity, hypertension, sleep apnea, dyslipidemia, chronic venous insufficiency, diastolic dysfunction, recurrent pulmonary embolism on chronic anticoagulation. Who fell at home and 2 days later presented with ankle pain with fx. S/p ex fix placement 10/30   OT comments  Pt seen for splint check and to complete nsg education. Demonstrated proper positioning of splint to avoid pressure of lateral aspects on foot to nsg. Nsg verbalized understanding. Pt tolerating well. Will reassess splinting needs in am and adjust as needed.   Follow Up Recommendations  SNF;Supervision/Assistance - 24 hour    Equipment Recommendations  Other (comment)    Recommendations for Other Services      Precautions / Restrictions Precautions Precautions: Fall Precaution Comments: at risk for skin breakdown Restrictions Weight Bearing Restrictions: Yes RLE Weight Bearing: Non weight bearing         Cognition   Behavior During Therapy: WFL for tasks assessed/performed Overall Cognitive Status: Within Functional Limits for tasks assessed                            Shoulder Instructions       General Comments      Pertinent Vitals/ Pain       Pain Assessment: 0-10 Pain Score: 3  Pain Location: R ankle Pain Descriptors / Indicators: Burning Pain Intervention(s): Limited activity within patient's tolerance;Monitored during session;Ice applied  Home Living Family/patient expects to be discharged to:: Skilled nursing facility Living Arrangements: Alone   Type of Home: Apartment (senior apartment)       Home Layout: One level     Bathroom Shower/Tub: Arts development officerWalk-in shower   Bathroom Toilet: Handicapped height     Home Equipment: Environmental consultantWalker - 2 wheels;Bedside commode;Grab bars  - tub/shower;Adaptive equipment Adaptive Equipment: Reacher;Sock aid;Long-handled shoe horn;Long-handled sponge        Prior Functioning/Environment Level of Independence: Independent with assistive device(s)        Comments: Pt was walking with walker, caring for herself and living alone. She did not wear oxygen   Frequency Min 3X/week     Progress Toward Goals  OT Goals(current goals can now be found in the care plan section)  Progress towards OT goals: Progressing toward goals  Acute Rehab OT Goals Patient Stated Goal: to get better OT Goal Formulation: With patient Time For Goal Achievement: 12/12/14 Potential to Achieve Goals: Fair ADL Goals Pt Will Perform Grooming: with supervision;sitting Pt Will Perform Lower Body Bathing: sit to/from stand;with mod assist Pt Will Perform Lower Body Dressing: sit to/from stand;with mod assist Pt Will Transfer to Toilet: with mod assist;stand pivot transfer;bedside commode Pt Will Perform Toileting - Clothing Manipulation and hygiene: with mod assist;sit to/from stand  Plan Discharge plan remains appropriate    Co-evaluation                 End of Session Equipment Utilized During Treatment: Oxygen   Activity Tolerance Patient tolerated treatment well   Patient Left in bed;with call bell/phone within reach;with nursing/sitter in room   Nurse Communication Other (comment) (splint care)        Time: [id number] OT Time Calculation (min): 34 min  Charges: OT General Charges OT Treatments $Therapeutic Activity: 8-22 mins   Mylo Driskill,HILLARY 11/28/2014, 4:52 PM   Ahnika Hannibal, OTR/L  [phone] 11/28/2014 

## 2014-11-28 NOTE — Progress Notes (Signed)
[facility] Progress Note  Subjective  Doing okay States she can't move/use her right arm C/o left knee pain that "locks up" and "gives out" on her, and so she couldn't work with PT yesterday to get to the chair   ROS As above  Objective   BP 121/94 mmHg  Pulse 74  Temp(Src) 97.5 F (36.4 C) (Oral)  Resp 18  Ht 5\' 2"  (1.575 m)  Wt 154.949 kg (341 lb 9.6 oz)  BMI 62.46 kg/m2  SpO2 98%  Intake/Output      10/30 0701 - 10/31 0700 10/31 0701 - 11/01 0700   P.O. 240    I.V. (mL/kg)     Total Intake(mL/kg) 240 (1.5)    Urine (mL/kg/hr) 2650 (0.7)    Blood     Total Output 2650     Net -2410            Labs Results for MAHLI, GLAHN (MRN Marta Lamas) as of 11/28/2014 08:39  Ref. Range 11/28/2014 04:05  Sodium Latest Ref Range: 135-145 mmol/L 140  Potassium Latest Ref Range: 3.5-5.1 mmol/L 3.9  Chloride Latest Ref Range: 101-111 mmol/L 98 (L)  CO2 Latest Ref Range: 22-32 mmol/L 34 (H)  BUN Latest Ref Range: 6-20 mg/dL 14  Creatinine Latest Ref Range: 0.44-1.00 mg/dL 01-01-1979  Calcium Latest Ref Range: 8.9-10.3 mg/dL 8.7 (L)  EGFR (Non-African Amer.) Latest Ref Range: >60 mL/min >60  EGFR (African American) Latest Ref Range: >60 mL/min >60  Glucose Latest Ref Range: 65-99 mg/dL 09-28-1971 (H)  Anion gap Latest Ref Range: 5-15  8  Alkaline Phosphatase Latest Ref Range: 38-126 U/L 58  Albumin Latest Ref Range: 3.5-5.0 g/dL 2.6 (L)  AST Latest Ref Range: 15-41 U/L 32  ALT Latest Ref Range: 14-54 U/L 19  Total Protein Latest Ref Range: 6.5-8.1 g/dL 5.9 (L)  Total Bilirubin Latest Ref Range: 0.3-1.2 mg/dL 1.0  Cholesterol Latest Ref Range: 0-200 mg/dL 036  Triglycerides Latest Ref Range: <150 mg/dL 92  HDL Cholesterol Latest Ref Range: >40 mg/dL 26 (L)  LDL (calc) Latest Ref Range: 0-99 mg/dL 59  VLDL Latest Ref Range: 0-40 mg/dL 18  Total CHOL/HDL Ratio Latest Units: RATIO 4.0  WBC Latest Ref Range: 4.0-10.5 K/uL 12.4 (H)  RBC Latest Ref Range: 3.87-5.11  MIL/uL 4.41  Hemoglobin Latest Ref Range: 12.0-15.0 g/dL 08-12-1968  HCT Latest Ref Range: 36.0-46.0 % 40.5  MCV Latest Ref Range: 78.0-100.0 fL 91.8  MCH Latest Ref Range: 26.0-34.0 pg 27.2  MCHC Latest Ref Range: 30.0-36.0 g/dL 10-14-1998 (L)  RDW Latest Ref Range: 11.5-15.5 % 15.4  Platelets Latest Ref Range: 150-400 K/uL 235   CBG (last 3)   Recent Labs  11/27/14 1631 11/27/14 2133 11/28/14 0635  GLUCAP 242* 143* 134*    Exam  Gen: Laying in bed, appears comfortable Ext:     Right Upper Extremity  Passive ROM intact  Unable to actively lift or hold up arm by herself  Ext warm  + Radial pulse    Right Lower Leg  Ex fix stable  Swelling stable  Dressing intact, some discharge from pinsites  Ext warm  + DP pulse    Left Lower Leg  Denies pain to palpation of left knee and ankle  Ext warm  Chronic venous insufficiency with skin changes     Assessment and Plan   POD/HD#: 1  69 y/o morbidly obese woman, s/p fall Friday 11/25/2014 at hospital in [place], Transferred to [facility] 11/27/2014  1. Right ankle  posterior dislocation and trimalleolar fracture s/p Ex fix 11/27/2014  Continue ice and elevation  NWB R LEx  PT/OT evals  Dressing change tomorrow    Considering treating definitively in external fixator if her soft tissue can handle it      R arm pain and decreased function  Xray from [facility] negative for fracture (10/29)  Moderate arthritis   Suspect Rotator cuff pathology   Continue to follow  Not sure of wt limit of MR scanner, will communicate with radiology. May consider MRI but not likely to intervene acutely     Will check xrays of B knee prostheses    These are >75 years old      2. Pain management:  Continue current regimen  3. ABL anemia/Hemodynamics  Stable  4. Medical issues   Home meds  Sliding scale insulin  5. DVT/PE prophylaxis:  Hx of blood clots in 2004 and 2006  Switched from coumadin to plavix per cardiology  6. ID:   Completed periop  antibiotics  7. Metabolic Bone Disease:  Metabolic bone panel pending  A1c pending   8. Activity:  PT/OT evals  Up to chair today, may need lift to help  9. FEN/Foley/Lines:  CHO modified diet  10. Ex-fix care:  Will change dressing tomorrow   11. Impediments to fracture healing:  Diabetes mellitus   12. Dispo:  PT/OT eval       Jari Pigg, PA-C [facility] [phone] (O) 11/28/2014 8:38 AM

## 2014-11-28 NOTE — Progress Notes (Signed)
Occupational Therapy Evaluation - Splint Patient Details Name: [name] MRN: [id number] DOB: 1945/05/05 Today's Date: 11/28/2014    History of Present Illness [name] is a 69 y.o. female with Past medical history of obesity, hypertension, sleep apnea, dyslipidemia, chronic venous insufficiency, diastolic dysfunction, recurrent pulmonary embolism on chronic anticoagulation. Who fell at home and 2 days later presented with ankle pain with fx. S/p ex fix placement 10/30   Clinical Impression   Pt seen for fabrication of footplate. Pt tolerated well. Will make adjustments to splint if needed in am. Order placed to remove splint every 2 hrs for skin check and to encourage pt to wiggle toes. If any redness/discomfort, please contact rehab @ [phone]. Will return for splint check this pm.     Follow Up Recommendations  SNF;Supervision/Assistance - 24 hour    Equipment Recommendations  Other (comment) (TBD at next venue)    Recommendations for Other Services       Precautions / Restrictions Precautions Precautions: Fall Precaution Comments: at risk for skin breakdown Restrictions Weight Bearing Restrictions: Yes RLE Weight Bearing: Non weight bearing      Mobility              Balance                                 Vision     Perception     Praxis      Pertinent Vitals/Pain Pain Assessment: 0-10 Pain Score: 3  Pain Location: R ankle Pain Descriptors / Indicators: Aching;Burning Pain Intervention(s): Limited activity within patient's tolerance;Monitored during session;Repositioned;Ice applied     Hand Dominance   Extremity/Trunk Assessment   Communication   Cognition   General Comments   Seen for fabrication of footplate. Splint fabricated to avoid pressure from lateral aspects of foot. Straps secured onto fixator. Educated pt on purpose of splint and to have nsg remove splintt q 2 hrs to complete skin checks and to wiggle toes. Order  placed in chart.     Exercises       Shoulder Instructions      Home Living Family/patient expects to be discharged to:: Skilled nursing facility Living Arrangements: Alone       Prior Functioning/Environment Level of Independence: Independent with assistive device(s)        Comments: Pt was walking with walker, caring for herself and living alone. She did not wear oxygen    OT Diagnosis: Generalized weakness;Acute pain   OT Problem List: Decreased strength;Decreased activity tolerance;Impaired balance (sitting and/or standing);Decreased safety awareness;Decreased knowledge of use of DME or AE;Decreased knowledge of precautions;Obesity;Impaired UE functional use;Pain   OT Treatment/Interventions: Self-care/ADL training;DME and/or AE instruction;Patient/family education    OT Goals(Current goals can be found in the care plan section) Acute Rehab OT Goals Patient Stated Goal: to get better OT Goal Formulation: With patient Time For Goal Achievement: 12/12/14 Potential to Achieve Goals: Fair ADL Goals Pt Will Perform Grooming: with supervision;sitting Pt Will Perform Lower Body Bathing: sit to/from stand;with mod assist Pt Will Perform Lower Body Dressing: sit to/from stand;with mod assist Pt Will Transfer to Toilet: with mod assist;stand pivot transfer;bedside commode Pt Will Perform Toileting - Clothing Manipulation and hygiene: with mod assist;sit to/from stand  OT Frequency: Min 2X/week   Barriers to D/C: Decreased caregiver support          Co-evaluation  End of Session Equipment Utilized During Treatment: Oxygen Nurse Communication: Other (comment) (splint )  Activity Tolerance: Patient tolerated treatment well Patient left: in bed;with call bell/phone within reach   Time: 1510-1550 OT Time Calculation (min): 40 min Charges:  OT General Charges $OT Visit: 1 Procedure OT Evaluation $Initial OT Evaluation Tier I: 1 Procedure OT  Treatments $Therapeutic Activity: 8-22 mins $Orthotics Fit/Training: 8-22 mins G-Codes:    Muaz Shorey,[name] 11/28/2014, 4:44 PM

## 2014-11-28 NOTE — Progress Notes (Signed)
[facility] PROGRESS NOTE  [name] WUJ:[id number] DOB: Mar 24, 1945 DOA: 11/26/2014 PCP: No primary care provider on file.  Assessment/Plan:  Principal Problem:   Ankle fracture, right: s/p reduction, ex fix 11/27/14 Active Problems:   Bilateral pneumonia: change to rocephin azithro for cap coverage   Acute on chronic diastolic CHF (congestive heart failure) (HCC), compensated   NSTEMI (non-ST elevated myocardial infarction) (HCC): d/w Dr. Anne Fu. Echo. Medical management.   Obesity   Sleep apnea   COPD (chronic obstructive pulmonary disease) (HCC)   Chronic venous insufficiency   Recurrent pulmonary embolism (HCC) Chronic anticoagulation: lovenox, warfarin   Diabetes mellitus (HCC): CBGs ok   Code Status:  full Family Communication:  Pt is lucid Disposition Plan:  SNF when stable  Consultants:  Ortho  cardiology  Procedures:   Ex fix  Antibiotics:  Vanc, zosyn  HPI/Subjective: Pain controlled. No n/v. No CP or SOB  Objective: Filed Vitals:   11/28/14 0944  BP:   Pulse: 86  Temp:   Resp:     Intake/Output Summary (Last 24 hours) at 11/28/14 1502 Last data filed at 11/27/14 1842  Gross per 24 hour  Intake    240 ml  Output    450 ml  Net   -210 ml   Filed Weights   11/26/14 2335  Weight: 154.949 kg (341 lb 9.6 oz)    Exam:   General:  Extreme morbid obesity. A and o  Cardiovascular: RRR without MGR  Respiratory: cTA without WRR  Abdomen: S, NT, ND  Ext: chronic venous stasis dermatitis. Ex fix and dressing CDI  Basic Metabolic Panel:  Recent Labs Lab 11/27/14 0445 11/28/14 0405  NA 133* 140  K 5.3* 3.9  CL 96* 98*  CO2 27 34*  GLUCOSE 155* 172*  BUN 16 14  CREATININE 0.86 0.92  CALCIUM 8.4*  8.6* 8.7*  MG 1.7  --   PHOS 3.8  --    Liver Function Tests:  Recent Labs Lab 11/27/14 0445 11/28/14 0405  AST 56* 32  ALT 29 19  ALKPHOS 64 58  BILITOT 1.6* 1.0  PROT 5.5* 5.9*  ALBUMIN 2.6* 2.6*   No results  for input(s): LIPASE, AMYLASE in the last 168 hours. No results for input(s): AMMONIA in the last 168 hours. CBC:  Recent Labs Lab 11/27/14 0445 11/28/14 0405  WBC 14.8* 12.4*  NEUTROABS 12.4*  --   HGB 12.2 12.0  HCT 40.4 40.5  MCV 91.0 91.8  PLT 214 235   Cardiac Enzymes:  Recent Labs Lab 11/27/14 0443 11/27/14 1120 11/27/14 1550  TROPONINI 2.12* 1.85* 1.71*   BNP (last 3 results)  Recent Labs  11/27/14 0711  BNP 163.5*    ProBNP (last 3 results) No results for input(s): PROBNP in the last 8760 hours.  CBG:  Recent Labs Lab 11/27/14 1123 11/27/14 1631 11/27/14 2133 11/28/14 0635 11/28/14 1122  GLUCAP 143* 242* 143* 134* 183*    Recent Results (from the past 240 hour(s))  Surgical pcr screen     Status: None   Collection Time: 11/27/14 12:05 AM  Result Value Ref Range Status   MRSA, PCR NEGATIVE NEGATIVE Final   Staphylococcus aureus NEGATIVE NEGATIVE Final    Comment:        The Xpert SA Assay (FDA approved for NASAL specimens in patients over 95 years of age), is one component of a comprehensive surveillance program.  Test performance has been validated by [facility] for patients greater than or equal to  69 year old. It is not intended to diagnose infection nor to guide or monitor treatment.   Culture, blood (routine x 2)     Status: None (Preliminary result)   Collection Time: 11/27/14  4:15 AM  Result Value Ref Range Status   Specimen Description BLOOD LEFT ANTECUBITAL  Final   Special Requests BOTTLES DRAWN AEROBIC AND ANAEROBIC 5CC   Final   Culture NO GROWTH 1 DAY  Final   Report Status PENDING  Incomplete  Culture, blood (routine x 2)     Status: None (Preliminary result)   Collection Time: 11/27/14  4:24 AM  Result Value Ref Range Status   Specimen Description BLOOD LEFT ARM  Final   Special Requests BOTTLES DRAWN AEROBIC AND ANAEROBIC 5CC   Final   Culture NO GROWTH 1 DAY  Final   Report Status PENDING  Incomplete  Gram stain      Status: None   Collection Time: 11/27/14 10:45 AM  Result Value Ref Range Status   Specimen Description URINE, RANDOM  Final   Special Requests NONE  Final   Gram Stain   Final    WBC PRESENT,BOTH PMN AND MONONUCLEAR BUDDING YEAST SEEN CYTOSPIN SMEAR    Report Status 11/27/2014 FINAL  Final     Studies: Dg Ankle Complete Right  11/27/2014  CLINICAL DATA:  External fixation of right ankle fracture. EXAM: RIGHT ANKLE - COMPLETE 3+ VIEW; DG C-ARM 61-120 MIN COMPARISON:  Right ankle radiographs performed 11/26/2014 FINDINGS: A single fluoroscopic C-arm image is provided from the OR. This demonstrates interval reduction of the previously noted right ankle dislocation, with mild residual displacement of distal fibular and medial malleolar fractures. A pin is seen overlying the ankle mortise. Surrounding soft tissue swelling is noted. IMPRESSION: Interval reduction of right ankle dislocation, with associated pain overlying the ankle mortise. Electronically Signed   By: Roanna RaiderJeffery  Chang M.D.   On: 11/27/2014 05:13   Ct Ankle Right Wo Contrast  11/27/2014  CLINICAL DATA:  Follow up right ankle fracture post external fixation. EXAM: CT OF THE RIGHT ANKLE WITHOUT CONTRAST TECHNIQUE: Multidetector CT imaging of the right ankle was performed according to the standard protocol. Multiplanar CT image reconstructions were also generated. COMPARISON:  Radiographs 11/27/2014. FINDINGS: External fixators traverse the mid tibial diaphysis and the calcaneal tuberosity. There is a rod traversing the subtalar and tibiotalar joints. The tibiotalar dislocation has been reduced. There is a comminuted intra-articular fracture of the distal tibia. There is involvement of the articular surface posteriorly, situated in the coronal plane. This fracture demonstrates up to 4 mm displacement medially and up to 2 mm of impaction of the articular surface posteriorly. Fracture extends into the base of the medial malleolus.  Laterally, the fracture extends into the distal tibiofibular articulation. There are small intra-articular fracture fragments. No significant widening of the ankle mortise is demonstrated. There is a comminuted fracture of the distal tibial diaphysis with a 4.6 cm butterfly fragment anteriorly. This fracture is only mildly displaced. The talar dome appears intact. No acute fractures are identified within the hindfoot. There is a possible nondisplaced fracture involving the base of the second metatarsal. Mild midfoot degenerative changes are present. There is no significant ankle joint effusion. Mild subcutaneous edema is present within lower leg and hindfoot. There are extensive vascular calcifications. IMPRESSION: 1. Improved alignment of comminuted intra-articular fracture of the distal tibia status post fixation. There is mild displacement and depression of the articular surface of the tibial plafond posteriorly. 2. Comminuted  fracture of the distal fibular diaphysis demonstrates minimal displacement. 3. Possible nondisplaced fracture of the second metatarsal base. Electronically Signed   By: Carey Bullocks M.D.   On: 11/27/2014 12:18   Dg Chest Port 1 View  11/27/2014  CLINICAL DATA:  Acute onset of shortness of breath, cough and congestion. Initial encounter. EXAM: PORTABLE CHEST 1 VIEW COMPARISON:  Chest radiograph performed 11/24/2014 FINDINGS: Previously noted pulmonary edema has improved. Residual mildly increased interstitial markings are noted. Vascular congestion is seen. No pleural effusion or pneumothorax is identified. The cardiomediastinal silhouette is borderline normal in size. No acute osseous abnormalities are identified. IMPRESSION: Interval improvement in pulmonary edema, though residual mildly increased interstitial markings are noted, with underlying vascular congestion. Electronically Signed   By: Roanna Raider M.D.   On: 11/27/2014 06:58   Dg Knee Left Port  11/28/2014  CLINICAL  DATA:  Fall. EXAM: PORTABLE LEFT KNEE - 1-2 VIEW COMPARISON:  None. FINDINGS: Exam detail is diminished secondary to body habitus and suboptimal patient positioning. A left knee arthroplasty device is identified. There is no fracture or subluxation identified. IMPRESSION: 1. Diminished exam detail secondary to large body habitus. 2. No acute findings noted. Electronically Signed   By: Signa Kell M.D.   On: 11/28/2014 10:28   Dg Knee Right Port  11/28/2014  CLINICAL DATA:  Large body habitus. Fall. History of right knee arthroplasty. EXAM: PORTABLE RIGHT KNEE - 1-2 VIEW COMPARISON:  None. FINDINGS: Previous right total knee arthroplasty. No evidence for joint effusion. No periprosthetic fracture or dislocation. IMPRESSION: 1. No acute findings noted. Electronically Signed   By: Signa Kell M.D.   On: 11/28/2014 10:18   Dg Ankle Right Port  11/27/2014  CLINICAL DATA:  Postop external fixation of RIGHT ankle fracture EXAM: PORTABLE RIGHT ANKLE - 2 VIEW COMPARISON:  11/26/2014 FINDINGS: External fixator device applied with large K-wire extending through the talus into the tibia. There is interval reduction of the fracture dislocation. Trimalleolar fracture noted. IMPRESSION: 1. Interval reduction of the fracture dislocation with external fixator. 2. Trimalleolar fracture Electronically Signed   By: Genevive Bi M.D.   On: 11/27/2014 07:17   Dg Humerus Right  11/28/2014  CLINICAL DATA:  Fall.  Difficulty raising arm. EXAM: RIGHT HUMERUS - 2+ VIEW COMPARISON:  None. FINDINGS: There is no evidence of fracture or other focal bone lesions. Soft tissues are unremarkable. IMPRESSION: Negative. Electronically Signed   By: Signa Kell M.D.   On: 11/28/2014 10:09   Dg C-arm 1-60 Min  11/27/2014  CLINICAL DATA:  External fixation of right ankle fracture. EXAM: RIGHT ANKLE - COMPLETE 3+ VIEW; DG C-ARM 61-120 MIN COMPARISON:  Right ankle radiographs performed 11/26/2014 FINDINGS: A single fluoroscopic  C-arm image is provided from the OR. This demonstrates interval reduction of the previously noted right ankle dislocation, with mild residual displacement of distal fibular and medial malleolar fractures. A pin is seen overlying the ankle mortise. Surrounding soft tissue swelling is noted. IMPRESSION: Interval reduction of right ankle dislocation, with associated pain overlying the ankle mortise. Electronically Signed   By: Roanna Raider M.D.   On: 11/27/2014 05:13    Scheduled Meds: . atorvastatin  80 mg Oral q1800  . budesonide-formoterol  2 puff Inhalation BID  . [START ON 11/29/2014] clopidogrel  75 mg Oral Daily  . enoxaparin (LOVENOX) injection  150 mg Subcutaneous Q12H  . ferrous fumarate  1 tablet Oral Daily  . insulin aspart  0-15 Units Subcutaneous 4 times per day  .  ipratropium-albuterol  3 mL Nebulization TID  . metoprolol tartrate  12.5 mg Oral BID  . mirabegron ER  25 mg Oral Daily  . pantoprazole  40 mg Oral Daily  . piperacillin-tazobactam (ZOSYN)  IV  3.375 g Intravenous 3 times per day  . vancomycin  1,250 mg Intravenous Q12H  . warfarin  6 mg Oral ONCE-1800  . Warfarin - Pharmacist Dosing Inpatient   Does not apply q1800   Continuous Infusions:   Time spent: 35 minutes  Evamaria Detore L  [facility] [email or web address], password [facility] 11/28/2014, 3:02 PM  LOS: 2 days

## 2014-11-28 NOTE — Progress Notes (Signed)
Patient Name: [name] Date of Encounter: 11/28/2014  Principal Problem:   Ankle fracture, right Active Problems:   Dislocation of right ankle joint   Bilateral pneumonia   Acute on chronic diastolic CHF (congestive heart failure) (HCC)   Elevated troponin   Obesity   Sleep apnea   COPD (chronic obstructive pulmonary disease) (HCC)   Chronic venous insufficiency   Recurrent pulmonary embolism (HCC)   Chronic anticoagulation   Diabetes mellitus [facility])   Primary Cardiologist: New, Previously seen by Dr. Andee Lineman in [place]. Patient Profile: 69 y.o female with PMH of HTN, HLD, Type 2 DM, morbid obesity, chronic diastolic CHF, and no known history of CAD who initially presented for ankle fracture secondary to a fall and underwent surgical repair on 11/27/2014. Postop she developed dyspnea. Troponin found to be elevated to 2.12.  SUBJECTIVE: Denies any chest pain or palpitations. Reports her dyspnea has improved. Having right arm pain and right ankle pain this morning.   OBJECTIVE Filed Vitals:   11/27/14 1940 11/28/14 0119 11/28/14 0445 11/28/14 0944  BP:   121/94   Pulse:   74 86  Temp:   97.5 F (36.4 C)   TempSrc:   Oral   Resp:   18   Height:      Weight:      SpO2: 97% 83% 98% 94%    Intake/Output Summary (Last 24 hours) at 11/28/14 1043 Last data filed at 11/27/14 1842  Gross per 24 hour  Intake    240 ml  Output   2450 ml  Net  -2210 ml   Filed Weights   11/26/14 2335  Weight: 341 lb 9.6 oz (154.949 kg)    PHYSICAL EXAM General: Obese Caucasian, female in no acute distress. Head: Normocephalic, atraumatic.  Neck: Supple without bruits, JVD unable to be assessed due to body habitus. Lungs:  Resp regular and unlabored, Expiratory wheezing present throughout upper lung fields.  Heart: RRR, S1, S2, no S3, S4, or murmur; no rub. Abdomen: Soft, non-tender, non-distended with normoactive bowel sounds. No hepatomegaly. No rebound/guarding. No obvious abdominal  masses. Extremities: No clubbing, cyanosis, or edema. Distal pedal pulses are 2+ bilaterally. Right ankle fixation device present. Neuro: Alert and oriented X 3. Moves all extremities spontaneously. Psych: Normal affect.  LABS: CBC: Recent Labs  11/27/14 0445 11/28/14 0405  WBC 14.8* 12.4*  NEUTROABS 12.4*  --   HGB 12.2 12.0  HCT 40.4 40.5  MCV 91.0 91.8  PLT 214 235   INR: Recent Labs  11/27/14 0443  INR 1.74*   Basic Metabolic Panel: Recent Labs  11/27/14 0445 11/28/14 0405  NA 133* 140  K 5.3* 3.9  CL 96* 98*  CO2 27 34*  GLUCOSE 155* 172*  BUN 16 14  CREATININE 0.86 0.92  CALCIUM 8.4* 8.7*  MG 1.7  --   PHOS 3.8  --    Liver Function Tests: Recent Labs  11/27/14 0445 11/28/14 0405  AST 56* 32  ALT 29 19  ALKPHOS 64 58  BILITOT 1.6* 1.0  PROT 5.5* 5.9*  ALBUMIN 2.6* 2.6*   Cardiac Enzymes: Recent Labs  11/27/14 0443 11/27/14 1120 11/27/14 1550  TROPONINI 2.12* 1.85* 1.71*   BNP:  B NATRIURETIC PEPTIDE  Date/Time Value Ref Range Status  11/27/2014 07:11 AM 163.5* 0.0 - 100.0 pg/mL Final   Fasting Lipid Panel: Recent Labs  11/28/14 0405  CHOL 103  HDL 26*  LDLCALC 59  TRIG 92  CHOLHDL 4.0  Thyroid Function Tests: Recent Labs  11/27/14 0445  TSH 9.347*   TELE: NSR with rate in 60's - 70's. Occasional PVC's.       ECHO: Pending  Radiology/Studies: Dg Chest Port 1 View: 11/27/2014  CLINICAL DATA:  Acute onset of shortness of breath, cough and congestion. Initial encounter. EXAM: PORTABLE CHEST 1 VIEW COMPARISON:  Chest radiograph performed 11/24/2014 FINDINGS: Previously noted pulmonary edema has improved. Residual mildly increased interstitial markings are noted. Vascular congestion is seen. No pleural effusion or pneumothorax is identified. The cardiomediastinal silhouette is borderline normal in size. No acute osseous abnormalities are identified. IMPRESSION: Interval improvement in pulmonary edema, though residual mildly  increased interstitial markings are noted, with underlying vascular congestion. Electronically Signed   By: Roanna Raider M.D.   On: 11/27/2014 06:58     Current Medications:  . aspirin EC  325 mg Oral Daily  . atorvastatin  80 mg Oral q1800  . budesonide-formoterol  2 puff Inhalation BID  . clopidogrel  300 mg Oral Daily  . enoxaparin (LOVENOX) injection  150 mg Subcutaneous Q12H  . ferrous fumarate  1 tablet Oral Daily  . insulin aspart  0-15 Units Subcutaneous 4 times per day  . ipratropium-albuterol  3 mL Nebulization Q6H  . mirabegron ER  25 mg Oral Daily  . pantoprazole  40 mg Oral Daily  . piperacillin-tazobactam (ZOSYN)  IV  3.375 g Intravenous 3 times per day  . vancomycin  1,250 mg Intravenous Q12H      ASSESSMENT AND PLAN:  1. Elevated troponin  - was previously 0.5 - 1.0 when checked at [facility]. Cyclic troponin values have been 2.12, 1.85, and 1.71. - has experienced episodes of dyspnea. Denies any recent or current chest pain. - New TWI's in lateral leads. No EKG in EPIC to compare to or in paperwork from [facility]. Cardiology notes in 2012 mention reviewing EKG and noticing no ischemic changes at that time. - seen by Dr. Virgina Organ who recommended treating medically. ASA and Plavix have been initiated. Ortho is fine with restarting anticoagulation and she is now on therapeutic Lovenox dosing. Would recommend switching back to Coumadin. Would discontinue ASA at this time to avoid triple therapy.  - Pending echo results, may need further evaluation with NST or cardiac catheterization. NST would likely not be very sensitive due to patient's body habitus, even with 2-day study. Would lean more toward obtaining cath if further invasive workup is needed. - Continue statin therapy. Will initiate low-dose BB.  2. Acute on chronic diastolic heart failure  - pulmonary edema noted on CXR. Given  IV Lasix x 1 dose. - BNP 163.5 on 11/27/2014. - echocardiogram is  pending.  3. Bilateral pneumonia with possible sepsis - continue Abx treatment - per primary team  4. Sleep apnea  - not on CPAP due to being associated with asthma exacerbations according to the patient.  5. Recurrent PE in 2004, 2006 - on chronic anticoagulation (warfarin) PTA.   6. Right Ankle Fx - s/p closed reduction and external fixation on 11/27/2014.  Lorri Frederick , PA-C 10:43 AM 11/28/2014 Pager: [phone]  Personally seen and examined. Agree with above. Challenging situation Based on ECG and Troponin elevation had NSTEMI with symptom of shortness of breath. No CP. Multiple risk factors, DM, obesity. She would be high risk at this point for diagnostic angiogram and after long discussion with her, will continue with aggressive medical management.  Plavix has been loaded.  Asa given Would recommend resuming  coumadin. I would rather coumadin vs. Eliquis with concomitant Plavix. Currently getting bridged with Lovenox. I think Plavix coumadin (no asa) would be reasonable and help reduce bleeding risk. Note, multiple ecchymosis.  ECHO pending. If markedly abnormal or if symptoms return, this may push us to invasive mgt.   She understands her increased CV risk given NSTEMI. Unfortunately, her husband died of CAD.  Donato SchultzSKAINS, Kassim Guertin, MD

## 2014-11-28 NOTE — [place] FL2 (Signed)
[place] MEDICAID FL2 LEVEL OF CARE SCREENING TOOL     IDENTIFICATION  Patient Name: [name] LamasLinda C [name] Birthdate: Aug 24, 1945 Sex: female Admission Date (Current Location): 11/26/2014  [place] [facility]   Facility and Address:  [facility], [address], [place], [place] [address]      Provider Number: [id number]  Attending Physician Name and Address:  Christiane Haorinna L Sullivan, MD  Relative Name and Phone Number:       Current Level of Care: Hospital Recommended Level of Care: Skilled Nursing Facility Prior Approval Number:    Date Approved/Denied:   PASRR Number: [id number]  Discharge Plan: SNF    Current Diagnoses: Patient Active Problem List   Diagnosis Date Noted  . Ankle fracture, right 11/27/2014  . Dislocation of right ankle joint 11/27/2014  . Bilateral pneumonia 11/27/2014  . Acute on chronic diastolic CHF (congestive heart failure) (HCC) 11/27/2014  . NSTEMI (non-ST elevated myocardial infarction) (HCC) 11/27/2014  . Obesity 11/27/2014  . Sleep apnea 11/27/2014  . COPD (chronic obstructive pulmonary disease) (HCC) 11/27/2014  . Chronic venous insufficiency 11/27/2014  . Recurrent pulmonary embolism (HCC) 11/27/2014  . Chronic anticoagulation 11/27/2014  . Diabetes mellitus (HCC) 11/27/2014    Orientation ACTIVITIES/SOCIAL BLADDER RESPIRATION    Self, Time, Situation, Place    Continent O2 (As needed) (3 liters per minute)  BEHAVIORAL SYMPTOMS/MOOD NEUROLOGICAL BOWEL NUTRITION STATUS      Continent Diet (Carb Modified)  PHYSICIAN VISITS COMMUNICATION OF NEEDS Height & Weight Skin    Verbally 5\' 2"  (157.5 cm) 341 lbs. Surgical wounds          AMBULATORY STATUS RESPIRATION    Supervision limited O2 (As needed) (3 liters per minute)      Personal Care Assistance Level of Assistance  Feeding, Bathing, Dressing Bathing Assistance: Maximum assistance Feeding assistance: Limited assistance Dressing  Assistance: Maximum assistance      Functional Limitations Info                SPECIAL CARE FACTORS FREQUENCY                      Additional Factors Info                  Current Medications (11/28/2014): Current Facility-Administered Medications  Medication Dose Route Frequency Provider Last Rate Last Dose  . acetaminophen (TYLENOL) tablet 650 mg  650 mg Oral Q6H PRN Montez MoritaKeith Paul, PA-C       Or  . acetaminophen (TYLENOL) suppository 650 mg  650 mg Rectal Q6H PRN Montez MoritaKeith Paul, PA-C      . atorvastatin (LIPITOR) tablet 80 mg  80 mg Oral q1800 Joellyn RuedWaqas T Qureshi, MD   80 mg at 11/28/14 1752  . azithromycin (ZITHROMAX) 500 mg in dextrose 5 % 250 mL IVPB  500 mg Intravenous Q24H Christiane Haorinna L Sullivan, MD   500 mg at 11/28/14 1753  . budesonide-formoterol (SYMBICORT) 160-4.5 MCG/ACT inhaler 2 puff  2 puff Inhalation BID Rolly SalterPranav M Patel, MD   2 puff at 11/28/14 0724  . cefTRIAXone (ROCEPHIN) 1 g in dextrose 5 % 50 mL IVPB  1 g Intravenous Q24H Christiane Haorinna L Sullivan, MD   1 g at 11/28/14 1621  . [START ON 11/29/2014] clopidogrel (PLAVIX) tablet 75 mg  75 mg Oral Daily Jake BatheMark C Skains, MD      . enoxaparin (LOVENOX) injection 150 mg  150 mg Subcutaneous [address]Q12H Nicholas P Del Mar HeightsGazda, [facility]PH  150 mg at 11/28/14 1302  . ferrous fumarate (HEMOCYTE - 106 mg FE) tablet 106 mg of iron  1 tablet Oral Daily Rolly Salter, MD   1 tablet at 11/28/14 1007  . HYDROcodone-acetaminophen (NORCO) 7.5-325 MG per tablet 1-2 tablet  1-2 tablet Oral Q6H PRN Montez Morita, PA-C   2 tablet at 11/28/14 1751  . HYDROmorphone (DILAUDID) injection 0.25-0.5 mg  0.25-0.5 mg Intravenous Q5 min PRN Arta Bruce, MD      . insulin aspart (novoLOG) injection 0-15 Units  0-15 Units Subcutaneous 4 times per day Rolly Salter, MD   3 Units at 11/28/14 1751  . ipratropium-albuterol (DUONEB) 0.5-2.5 (3) MG/3ML nebulizer solution 3 mL  3 mL Nebulization TID Christiane Ha, MD      . ipratropium-albuterol (DUONEB) 0.5-2.5 (3) MG/3ML  nebulizer solution 3 mL  3 mL Nebulization Q4H PRN Christiane Ha, MD      . menthol-cetylpyridinium (CEPACOL) lozenge 3 mg  1 lozenge Oral PRN Rolly Salter, MD      . meperidine (DEMEROL) injection 6.25-12.5 mg  6.25-12.5 mg Intravenous Q5 min PRN Arta Bruce, MD      . methocarbamol (ROBAXIN) tablet 500 mg  500 mg Oral Q6H PRN Montez Morita, PA-C   500 mg at 11/27/14 0522   Or  . methocarbamol (ROBAXIN) 500 mg in dextrose 5 % 50 mL IVPB  500 mg Intravenous Q6H PRN Montez Morita, PA-C      . metoCLOPramide (REGLAN) tablet 5-10 mg  5-10 mg Oral Q8H PRN Montez Morita, PA-C       Or  . metoCLOPramide (REGLAN) injection 5-10 mg  5-10 mg Intravenous Q8H PRN Montez Morita, PA-C      . metoprolol tartrate (LOPRESSOR) tablet 12.5 mg  12.5 mg Oral BID Ellsworth Lennox, PA   12.5 mg at 11/28/14 1301  . mirabegron ER (MYRBETRIQ) tablet 25 mg  25 mg Oral Daily Rolly Salter, MD   25 mg at 11/28/14 1007  . morphine 2 MG/ML injection 1-2 mg  1-2 mg Intravenous Q2H PRN Montez Morita, PA-C   2 mg at 11/28/14 0553  . ondansetron (ZOFRAN) injection 4 mg  4 mg Intravenous Once PRN Arta Bruce, MD      . ondansetron [facility]) tablet 4 mg  4 mg Oral Q6H PRN Montez Morita, PA-C       Or  . ondansetron [facility]) injection 4 mg  4 mg Intravenous Q6H PRN Montez Morita, PA-C      . pantoprazole (PROTONIX) EC tablet 40 mg  40 mg Oral Daily Rolly Salter, MD   40 mg at 11/28/14 1007  . Warfarin - Pharmacist Dosing Inpatient   Does not apply q1800 Herby Abraham, RPH   0  at 11/28/14 1800   Do not use this list as official medication orders. Please verify with discharge summary.  Discharge Medications:   Medication List    ASK your doctor about these medications        albuterol 108 (90 BASE) MCG/ACT inhaler  Commonly known as:  PROVENTIL HFA;VENTOLIN HFA  Inhale into the lungs every 6 (six) hours as needed for wheezing or shortness of breath.     albuterol (2.5 MG/3ML) 0.083% nebulizer solution  Commonly known as:   PROVENTIL  Inhale 3 mLs into the lungs every 6 (six) hours as needed for wheezing or shortness of breath.     budesonide 0.25 MG/2ML nebulizer solution  Commonly known as:  PULMICORT  Take 0.25 mg by nebulization as needed.     budesonide-formoterol 160-4.5 MCG/ACT inhaler  Commonly known as:  SYMBICORT  Inhale 2 puffs into the lungs 2 (two) times daily.     CALCIUM + D PO  Take 1 tablet by mouth daily.     esomeprazole 40 MG capsule  Commonly known as:  NEXIUM  Take 40 mg by mouth daily at 12 noon.     ferrous fumarate 325 (106 FE) MG Tabs tablet  Commonly known as:  HEMOCYTE - 106 mg FE  Take 1 tablet by mouth.     JANUVIA 100 MG tablet  Generic drug:  sitaGLIPtin  Take 100 mg by mouth daily.     LORazepam 2 MG tablet  Commonly known as:  ATIVAN  Take 0.5 mg by mouth at bedtime.     metFORMIN 500 MG tablet  Commonly known as:  GLUCOPHAGE  Take 500-1,000 mg by mouth 2 (two) times daily. Takes  in am and  in pm     MYRBETRIQ 50 MG Tb24 tablet  Generic drug:  mirabegron ER  Take 50 mg by mouth daily.     nystatin 100000 UNIT/GM Powd  Apply 1 g topically daily as needed (FOR RASH).     oxyCODONE-acetaminophen 10-325 MG tablet  Commonly known as:  PERCOCET  Take 1 tablet by mouth daily as needed for pain.     ranitidine 300 MG tablet  Commonly known as:  ZANTAC  Take 300 mg by mouth at bedtime.     simvastatin 10 MG tablet  Commonly known as:  ZOCOR  Take 10 mg by mouth every evening.     warfarin 2 MG tablet  Commonly known as:  COUMADIN  Take 4-6 mg by mouth every evening. 2 tablets daily except 3 tablets Mondays and Friday. Last dose 10/27        Relevant Imaging Results:  Relevant Lab Results:  Recent Labs    Additional Information    Yoselin Amerman, Ervin Knack, LCSWA

## 2014-11-28 NOTE — Evaluation (Signed)
Occupational Therapy Evaluation Patient Details Name: [name] LamasLinda C Maynez MRN: [id number] DOB: 1945-04-05 Today's Date: 11/28/2014    History of Present Illness [name] LamasLinda C Baik is a 69 y.o. female with Past medical history of obesity, hypertension, sleep apnea, dyslipidemia, chronic venous insufficiency, diastolic dysfunction, recurrent pulmonary embolism on chronic anticoagulation. Who fell at home and 2 days later presented with ankle pain with fx. S/p ex fix placement 10/30   Clinical Impression   Pt reports she was independent with ADLs PTA. Currently pt is max A overall for ADLs. Pt is mod A for bed mobility at this time. Educated pt on compensatory strategies for LB ADLs and edema management techniques; pt verbalized understanding. Recommending SNF for further rehab prior to returning home in order to maximize independence and safety with ADLs and functional mobility. Pt would benefit from continued skilled OT in order to increase independence and safety with LB ADLs and functional transfers.     Follow Up Recommendations  SNF;Supervision/Assistance - 24 hour    Equipment Recommendations  Other (comment) (TBD at next venue)    Recommendations for Other Services       Precautions / Restrictions Precautions Precautions: Fall Precaution Comments: at risk for skin breakdown Restrictions Weight Bearing Restrictions: Yes RLE Weight Bearing: Non weight bearing      Mobility Bed Mobility Overal bed mobility: Needs Assistance;+ 2 for safety/equipment Bed Mobility: Supine to Sit;Sit to Supine     Supine to sit: Mod assist;HOB elevated Sit to supine: Mod assist;HOB elevated   General bed mobility comments: Pt able to pivot to EOB with mod assist for LEs. Mod assist to return to supine. Max +2 to scoot to [facility]  Transfers Overall transfer level: Needs assistance                    Balance Overall balance assessment: Needs assistance Sitting-balance support: Bilateral upper  extremity supported Sitting balance-Leahy Scale: Fair                                      ADL Overall ADL's : Needs assistance/impaired Eating/Feeding: Set up;Bed level Eating/Feeding Details (indicate cue type and reason): Pt reports difficulty feeding herself with her left hand but she is able Grooming: Set up;Bed level               Lower Body Dressing: Maximal assistance;Sitting/lateral leans Lower Body Dressing Details (indicate cue type and reason): Unable to reach to LEs               General ADL Comments: Educated pt on compensatory strategies for LB ADLs, edema management techniques; pt verbalized understanding.      Vision     Perception     Praxis      Pertinent Vitals/Pain Pain Assessment: 0-10 Pain Score: 3  Pain Location: R ankle Pain Descriptors / Indicators: Aching;Burning Pain Intervention(s): Limited activity within patient's tolerance;Monitored during session;Repositioned;Ice applied     Hand Dominance Right   Extremity/Trunk Assessment Upper Extremity Assessment Upper Extremity Assessment: RUE deficits/detail RUE Deficits / Details: Limited AROM in shoulder, PROM WFL. Decreased strength in shoulder, unable to hold against gravity   Lower Extremity Assessment Lower Extremity Assessment: Defer to PT evaluation   Cervical / Trunk Assessment Cervical / Trunk Assessment: Kyphotic   Communication Communication Communication: No difficulties   Cognition Arousal/Alertness: Awake/alert Behavior During Therapy: WFL for tasks assessed/performed Overall Cognitive Status: Within  Functional Limits for tasks assessed                     General Comments       Exercises       Shoulder Instructions      Home Living Family/patient expects to be discharged to:: Skilled nursing facility Living Arrangements: Alone   Type of Home: Apartment (senior apartment)       Home Layout: One level     Bathroom Shower/Tub:  Arts development officer Toilet: Handicapped height     Home Equipment: Environmental consultant - 2 wheels;Bedside commode;Grab bars - tub/shower;Adaptive equipment Adaptive Equipment: Reacher;Sock aid;Long-handled shoe horn;Long-handled sponge        Prior Functioning/Environment Level of Independence: Independent with assistive device(s)        Comments: Pt was walking with walker, caring for herself and living alone. She did not wear oxygen    OT Diagnosis: Generalized weakness;Acute pain   OT Problem List: Decreased strength;Decreased activity tolerance;Impaired balance (sitting and/or standing);Decreased safety awareness;Decreased knowledge of use of DME or AE;Decreased knowledge of precautions;Obesity;Impaired UE functional use;Pain   OT Treatment/Interventions: Self-care/ADL training;DME and/or AE instruction;Patient/family education    OT Goals(Current goals can be found in the care plan section) Acute Rehab OT Goals Patient Stated Goal: to get better OT Goal Formulation: With patient Time For Goal Achievement: 12/12/14 Potential to Achieve Goals: Fair ADL Goals Pt Will Perform Grooming: with supervision;sitting Pt Will Perform Lower Body Bathing: sit to/from stand;with mod assist Pt Will Perform Lower Body Dressing: sit to/from stand;with mod assist Pt Will Transfer to Toilet: with mod assist;stand pivot transfer;bedside commode Pt Will Perform Toileting - Clothing Manipulation and hygiene: with mod assist;sit to/from stand  OT Frequency: Min 2X/week   Barriers to D/C: Decreased caregiver support          Co-evaluation              End of Session  Activity Tolerance: Patient tolerated treatment well Patient left: in bed;with call bell/phone within reach   Time: 1420-1454 OT Time Calculation (min): 34 min Charges:  OT General Charges $OT Visit: 1 Procedure OT Evaluation $Initial OT Evaluation Tier I: 1 Procedure OT Treatments $Therapeutic Activity: 8-22  mins G-Codes:     Gaye Alken M.S., OTR/L Pager: [phone]  11/28/2014, 4:26 PM

## 2014-11-28 NOTE — Progress Notes (Signed)
Utilization review completed. Beatrix Breece, RN, BSN. 

## 2014-11-28 NOTE — Progress Notes (Signed)
ANTICOAGULATION CONSULT NOTE - Follow Up Consult  Pharmacy Consult for LMWH and Coumadin Indication: hx PE x 2   Patient Measurements: Height: 5\' 2"  (157.5 cm) Weight: (!) 341 lb 9.6 oz (154.949 kg) IBW/kg (Calculated) : 50.1   Vital Signs: Pulse Rate: 86 (10/31 0944)  Labs:  Recent Labs  11/27/14 0443 11/27/14 0445 11/27/14 1120 11/27/14 1550 11/28/14 0405  HGB  --  12.2  --   --  12.0  HCT  --  40.4  --   --  40.5  PLT  --  214  --   --  235  APTT 47*  --   --   --   --   LABPROT 20.3*  --   --   --   --   INR 1.74*  --   --   --   --   CREATININE  --  0.86  --   --  0.92  TROPONINI 2.12*  --  1.85* 1.71*  --      Assessment:  69 yo F on warfarin PTA. LMWH/coum  per RX for hx of PE.  Cards note 10/31 Plavix has been loaded. Asa given. cards would rather coumadin vs. Eliquis with concomitant Plavix. Currently getting bridged with Lovenox. Cards think Plavix coumadin (no asa) would be reasonable and help reduce bleeding risk. Note, multiple ecchymosis.    10/31: LMWL: 0.79 (4 hours post dose) on Lovenox 150 mg Q12H   INR 1.74 yesterday, none today. CBC stable;  POD #1 S/p closed reduction and external fixation of right ankle.  I interviewed her and she takes coumadin 2 mg tabs. 2 tabs daily x Mon Friday 3 tabs = 4 mg daily x 6 mg on Mon/Friday. last dose 10/27.  No new bleeding reported.  Goal of Therapy:  INR 2-3 Anti-Xa level 0.6-1 units/ml 4hrs after LMWH dose given Monitor platelets by anticoagulation protocol: Yes   Plan:  - Continue Lovenox SQ 150 mg q12h (1 mg/kg q 12h) - Monitor CBC, S&S of bleed    Pollyann SamplesAndy Jahniya Duzan, PharmD, BCPS 11/28/2014, 5:17 PM Pager: [phone]

## 2014-11-28 NOTE — Progress Notes (Signed)
ANTICOAGULATION CONSULT NOTE - Follow Up Consult  Pharmacy Consult for LMWH and Coumadin Indication: hx PE x 2  Allergies  Allergen Reactions  . Talwin [Pentazocine] Other (See Comments)    Extreme hot feeling  . Fentanyl Other (See Comments)    Fainted?  . Latex Itching    Patient Measurements: Height: 5\' 2"  (157.5 cm) Weight: (!) 341 lb 9.6 oz (154.949 kg) IBW/kg (Calculated) : 50.1   Vital Signs: Temp: 97.5 F (36.4 C) (10/31 0445) Temp Source: Oral (10/31 0445) BP: 121/94 mmHg (10/31 0445) Pulse Rate: 86 (10/31 0944)  Labs:  Recent Labs  11/27/14 0443 11/27/14 0445 11/27/14 1120 11/27/14 1550 11/28/14 0405  HGB  --  12.2  --   --  12.0  HCT  --  40.4  --   --  40.5  PLT  --  214  --   --  235  APTT 47*  --   --   --   --   LABPROT 20.3*  --   --   --   --   INR 1.74*  --   --   --   --   CREATININE  --  0.86  --   --  0.92  TROPONINI 2.12*  --  1.85* 1.71*  --     Estimated Creatinine Clearance: 83.8 mL/min (by C-G formula based on Cr of 0.92).   Assessment:  69 yo F on warfarin PTA. LMWH/coum  per RX for hx of PE.  Cards note 10/31 Plavix has been loaded. Asa given. cards would rather coumadin vs. Eliquis with concomitant Plavix. Currently getting bridged with Lovenox. Cards think Plavix coumadin (no asa) would be reasonable and help reduce bleeding risk. Note, multiple ecchymosis.     INR 1.74 yesterday, none today. CBC stable;  POD #1 S/p closed reduction and external fixation of right ankle.  I interviewed her and she takes coumadin 2 mg tabs. 2 tabs daily x Mon Friday 3 tabs = 4 mg daily x 6 mg on Mon/Friday. last dose 10/27.  No new bleeding reported.  Goal of Therapy:  INR 2-3 Anti-Xa level 0.6-1 units/ml 4hrs after LMWH dose given Monitor platelets by anticoagulation protocol: Yes   Plan:  - dc aspirin 10/31, continue plavix Lovenox SQ 150 mg q12h (1 mg/kg q 12h) Monitor CBC, S&S of bleed Check HL once at SS (after 3 doses) 3rd dose given  1300, 4 hr level at 1700 Coumadin 6 mg po x 1 dose Daily INR  Herby AbrahamMichelle T. Ronald Vinsant, Pharm.D. [phone] 11/28/2014 1:57 PM

## 2014-11-28 NOTE — Clinical Social Work Note (Signed)
Clinical Social Work Assessment  Patient Details  Name: Marta LamasLinda C Colella MRN: [id number] Date of Birth: [birth year]  Date of referral:  11/28/14               Reason for consult:  Facility Placement                Permission sought to share information with:  Facility Industrial/product designerContact Representative Permission granted to share information::  Yes, Verbal Permission Granted  Name::        Agency::  SNF admissions  Relationship::     Contact Information:     Housing/Transportation Living arrangements for the past 2 months:  Single Family Home Source of Information:  Patient Patient Interpreter Needed:  None Criminal Activity/Legal Involvement Pertinent to Current Situation/Hospitalization:  No - Comment as needed Significant Relationships:  Adult Children Lives with:  Self Do you feel safe going back to the place where you live?  Yes (Patient would like some short term rehab before she is able to return back home.) Need for family participation in patient care:  No (Coment)  Care giving concerns: Patient feels she needs some short term rehab in order to return back home.   Social Worker assessment / plan:  Patient is a 69 year old female who lives alone.  Patient expressed that she has been in rehab in the past, but did not have a good experience at the last facility.  Patient stated she would like to go to a SNF in the [place] area, because that is where she lives and her son lives in the area as well.  Patient expressed that she agrees that she needs some short term rehab and is hopeful to get well again in order to return back home.  Patient expressed that she is familiar with the process of SNF placement, CSW reexplained bed search process for patient.  Patient expressed she does not have any other question at this time, and CSW contact information was given to patient.  Patient gave CSW permission to fax out to SNF facilities.  Employment status:  Retired Database administratornsurance information:  Managed Medicare PT  Recommendations:  Skilled Nursing Facility Information / Referral to community resources:     Patient/Family's Response to care:  Patient in agreement to going to SNF for short term rehab.  Patient/Family's Understanding of and Emotional Response to Diagnosis, Current Treatment, and Prognosis:  Patient is aware of her prognosis and current treatment plan.  Emotional Assessment Appearance:  Appears stated age Attitude/Demeanor/Rapport:    Affect (typically observed):  Appropriate, Calm, Stable Orientation:  Oriented to Self, Oriented to Place, Oriented to  Time, Oriented to Situation Alcohol / Substance use:  Not Applicable Psych involvement (Current and /or in the community):  No (Comment)  Discharge Needs  Concerns to be addressed:  Lack of Support Readmission within the last 30 days:  No Current discharge risk:  Lack of support system, Lives alone Barriers to Discharge:  No Barriers Identified   Darleene Cleavernterhaus, Jaleiah Asay R, LCSWA 11/28/2014, 6:06 PM

## 2014-11-28 NOTE — Clinical Social Work Placement (Signed)
   CLINICAL SOCIAL WORK PLACEMENT  NOTE  Date:  11/28/2014  Patient Details  Name: [name] LamasLinda C [name] MRN: [id number] Date of Birth: Aug 08, 1945  Clinical Social Work is seeking post-discharge placement for this patient at the Skilled  Nursing Facility level of care (*CSW will initial, date and re-position this form in  chart as items are completed):  Yes   Patient/family provided with [facility] Clinical Social Work Department's list of facilities offering this level of care within the geographic area requested by the patient (or if unable, by the patient's family).  Yes   Patient/family informed of their freedom to choose among providers that offer the needed level of care, that participate in Medicare, Medicaid or managed care program needed by the patient, have an available bed and are willing to accept the patient.  Yes   Patient/family informed of [facility]'s ownership interest in [facility] and [facility], as well as of the fact that they are under no obligation to receive care at these facilities.  PASRR submitted to EDS on 11/28/14     PASRR number received on 11/28/14     Existing PASRR number confirmed on       FL2 transmitted to all facilities in geographic area requested by pt/family on 11/28/14     FL2 transmitted to all facilities within larger geographic area on 11/28/14     Patient informed that his/her managed care company has contracts with or will negotiate with certain facilities, including the following:            Patient/family informed of bed offers received.  Patient chooses bed at       Physician recommends and patient chooses bed at      Patient to be transferred to   on  .  Patient to be transferred to facility by       Patient family notified on   of transfer.  Name of family member notified:        PHYSICIAN Please sign FL2     Additional Comment:    _______________________________________________ Darleene CleaverAnterhaus, Fayez Sturgell R,  LCSWA 11/28/2014, 6:11 PM

## 2014-11-29 ENCOUNTER — Inpatient Hospital Stay (HOSPITAL_COMMUNITY): Payer: Medicare Other

## 2014-11-29 DIAGNOSIS — R079 Chest pain, unspecified: Secondary | ICD-10-CM

## 2014-11-29 LAB — EXPECTORATED SPUTUM ASSESSMENT W REFEX TO RESP CULTURE

## 2014-11-29 LAB — GLUCOSE, CAPILLARY
GLUCOSE-CAPILLARY: 154 mg/dL — AB (ref 65–99)
Glucose-Capillary: 139 mg/dL — ABNORMAL HIGH (ref 65–99)
Glucose-Capillary: 175 mg/dL — ABNORMAL HIGH (ref 65–99)
Glucose-Capillary: 191 mg/dL — ABNORMAL HIGH (ref 65–99)

## 2014-11-29 LAB — EXPECTORATED SPUTUM ASSESSMENT W GRAM STAIN, RFLX TO RESP C

## 2014-11-29 LAB — T3, FREE: T3 FREE: 1.8 pg/mL — AB (ref 2.0–4.4)

## 2014-11-29 LAB — PROTIME-INR
INR: 1.75 — ABNORMAL HIGH (ref 0.00–1.49)
Prothrombin Time: 20.4 seconds — ABNORMAL HIGH (ref 11.6–15.2)

## 2014-11-29 MED ORDER — AZITHROMYCIN 500 MG PO TABS
500.0000 mg | ORAL_TABLET | Freq: Every day | ORAL | Status: DC
  Administered 2014-11-29 – 2014-11-30 (×2): 500 mg via ORAL
  Filled 2014-11-29 (×2): qty 1

## 2014-11-29 MED ORDER — WARFARIN SODIUM 6 MG PO TABS
6.0000 mg | ORAL_TABLET | Freq: Once | ORAL | Status: AC
  Administered 2014-11-29: 6 mg via ORAL
  Filled 2014-11-29 (×2): qty 1

## 2014-11-29 MED ORDER — VITAMIN D 1000 UNITS PO TABS
2000.0000 [IU] | ORAL_TABLET | Freq: Two times a day (BID) | ORAL | Status: DC
  Administered 2014-11-29 – 2014-11-30 (×2): 2000 [IU] via ORAL
  Filled 2014-11-29 (×2): qty 2

## 2014-11-29 MED ORDER — PERFLUTREN LIPID MICROSPHERE
1.0000 mL | INTRAVENOUS | Status: AC | PRN
  Administered 2014-11-29: 2 mL via INTRAVENOUS
  Filled 2014-11-29: qty 10

## 2014-11-29 NOTE — Progress Notes (Signed)
Occupational Therapy Treatment Patient Details Name: [name] LamasLinda C Ashland MRN: [id number] DOB: 1945-08-30 Today's Date: 11/29/2014    History of present illness [name] LamasLinda C Dettore is a 69 y.o. female with Past medical history of obesity, hypertension, sleep apnea, dyslipidemia, chronic venous insufficiency, diastolic dysfunction, recurrent pulmonary embolism on chronic anticoagulation. Who fell at home and 2 days later presented with ankle pain with fx. S/p ex fix placement 10/30   OT comments  Pt seen for splint check. Splint repositioned properly. Marked appropriately so staff could identify top of splint. Splint to be removed every 2 hours for skin checks and to wiggle toes. Pt upset and asking to get OOB. Will locate larger recliner and return to mobilize pt OOB.   Follow Up Recommendations  SNF;Supervision/Assistance - 24 hour    Equipment Recommendations  Other (comment)    Recommendations for Other Services      Precautions / Restrictions Precautions Precautions: Fall Precaution Comments: at risk for skin breakdown; No R shoulder movement (new)              ADL    Pt seen for splint check. Appears to be tolerating without difficulty.                                                                            Cognition   Behavior During Therapy: WFL for tasks assessed/performed Overall Cognitive Status: Within Functional Limits for tasks assessed                                                       Pertinent Vitals/ Pain       Pain Assessment: 0-10 Pain Score: 2  Pain Location: R ankle Pain Descriptors / Indicators: Burning Pain Intervention(s): Limited activity within patient's tolerance;Repositioned;Ice applied  H                                          Prior Functioning/Environment              Frequency Min 3X/week     Progress Toward Goals  OT Goals(current goals can now be found  in the care plan section)  Progress towards OT goals: Progressing toward goals  Acute Rehab OT Goals Patient Stated Goal: to get better OT Goal Formulation: With patient Time For Goal Achievement: 12/12/14 Potential to Achieve Goals: Fair ADL Goals Pt Will Perform Grooming: with supervision;sitting Pt Will Perform Lower Body Bathing: sit to/from stand;with mod assist Pt Will Perform Lower Body Dressing: sit to/from stand;with mod assist Pt Will Transfer to Toilet: with mod assist;stand pivot transfer;bedside commode Pt Will Perform Toileting - Clothing Manipulation and hygiene: with mod assist;sit to/from stand Additional ADL Goal #1: Pt will tolerate R footplate splint without problems to maintain foot at neutral position.  Plan Discharge plan remains appropriate    Co-evaluation   [facility] Minha Fulco, OTR/L  [phone] 11/29/2014  End of Session     Activity Tolerance Patient tolerated treatment well   Patient Left in bed;with call bell/phone within reach   Nurse Communication Mobility status;Precautions;Weight bearing status        Time: 1440-1455 OT Time Calculation (min): 15 min  Charges: OT General Charges $OT Visit: 1 Procedure OT Treatments $Orthotics Fit/Training: 8-22 mins  Kayde Atkerson,HILLARY 11/29/2014, 4:14 PM

## 2014-11-29 NOTE — Progress Notes (Signed)
[facility] PROGRESS NOTE  [name] ZOX:[id number] DOB: Jan 19, 1946 DOA: 11/26/2014 PCP: No primary care provider on file.  Summary 69 y.o. female with Past medical history of obesity, hypertension, sleep apnea, dyslipidemia, chronic venous insufficiency, diastolic dysfunction, recurrent pulmonary embolism on chronic anticoagulation. The patient is presenting with complaints of right ankle pain. The patient was initially seen in [facility] ER and was admitted on 11/25/2014 at [facility]. Patient presented there after 2 days of generalized weakness, cough, shortness of breath. She also has a fall 2 days ago prior to arrival when she was ambulating she suddenly lost her balance and fell on the ground. She denied any chest pain at the time she denied any dizziness or lightheadedness. She denied any focal deficit. She was found to be having bilateral pneumonia as well as probable venous congestion with cellulitis. She was started on vancomycin and zosyn. Patient was reevaluated on 11/26/2014 and was found to be having a right ankle fracture and then was referred to [facility].  Assessment/Plan:  Principal Problem:   Ankle fracture, right: s/p reduction, ex fix 11/27/14. Plans per ortho.  Eventual SNF  Active Problems:   Bilateral pneumonia: changed to rocephin azithro for cap coverage. Change azithro to PO    Acute on chronic diastolic CHF (congestive heart failure) (HCC), compensated    NSTEMI (non-ST elevated myocardial infarction) [facility]): cardiology following. Echo pending. Medical management.    Obesity    Sleep apnea    COPD (chronic obstructive pulmonary disease) (HCC) stable    Chronic venous insufficiency    Recurrent pulmonary embolism (HCC) Chronic anticoagulation: lovenox, warfarin per pharmacy    Diabetes mellitus (HCC): CBGs ok   Code Status:  full Family Communication:  Pt is lucid Disposition Plan:  SNF when  stable  Consultants:  Ortho  cardiology  Procedures:   Ex fix  Antibiotics:  Vanc, zosyn stopped 10/31  Rocephin azithro 10/31 -  HPI/Subjective: Getting echo  Objective: Filed Vitals:   11/29/14 0530  BP: 136/69  Pulse: 67  Temp: 97.7 F (36.5 C)  Resp: 18    Intake/Output Summary (Last 24 hours) at 11/29/14 1259 Last data filed at 11/29/14 0531  Gross per 24 hour  Intake    240 ml  Output    250 ml  Net    -10 ml   Filed Weights   11/26/14 2335 11/29/14 0618  Weight: 154.949 kg (341 lb 9.6 oz) 155.856 kg (343 lb 9.6 oz)    Exam:   General:  Extreme morbid obesity. A and o. Getting echo currently  Cardiovascular: RRR without MGR  Respiratory: cTA without WRR  Abdomen: S, NT, ND  Ext: chronic venous stasis dermatitis. Ex fix and dressing CDI  Basic Metabolic Panel:  Recent Labs Lab 11/27/14 0445 11/28/14 0405  NA 133* 140  K 5.3* 3.9  CL 96* 98*  CO2 27 34*  GLUCOSE 155* 172*  BUN 16 14  CREATININE 0.86 0.92  CALCIUM 8.4*  8.6* 8.7*  MG 1.7  --   PHOS 3.8  --    Liver Function Tests:  Recent Labs Lab 11/27/14 0445 11/28/14 0405  AST 56* 32  ALT 29 19  ALKPHOS 64 58  BILITOT 1.6* 1.0  PROT 5.5* 5.9*  ALBUMIN 2.6* 2.6*   No results for input(s): LIPASE, AMYLASE in the last 168 hours. No results for input(s): AMMONIA in the last 168 hours. CBC:  Recent Labs Lab 11/27/14 0445 11/28/14 0405  WBC 14.8* 12.4*  NEUTROABS 12.4*  --  HGB 12.2 12.0  HCT 40.4 40.5  MCV 91.0 91.8  PLT 214 235   Cardiac Enzymes:  Recent Labs Lab 11/27/14 0443 11/27/14 1120 11/27/14 1550  TROPONINI 2.12* 1.85* 1.71*   BNP (last 3 results)  Recent Labs  11/27/14 0711  BNP 163.5*    ProBNP (last 3 results) No results for input(s): PROBNP in the last 8760 hours.  CBG:  Recent Labs Lab 11/28/14 1122 11/28/14 1745 11/29/14 0043 11/29/14 0500 11/29/14 1140  GLUCAP 183* 158* 175* 139* 154*    Recent Results (from the past  240 hour(s))  Surgical pcr screen     Status: None   Collection Time: 11/27/14 12:05 AM  Result Value Ref Range Status   MRSA, PCR NEGATIVE NEGATIVE Final   Staphylococcus aureus NEGATIVE NEGATIVE Final    Comment:        The Xpert SA Assay (FDA approved for NASAL specimens in patients over 52 years of age), is one component of a comprehensive surveillance program.  Test performance has been validated by [facility] for patients greater than or equal to 20 year old. It is not intended to diagnose infection nor to guide or monitor treatment.   Culture, blood (routine x 2)     Status: None (Preliminary result)   Collection Time: 11/27/14  4:15 AM  Result Value Ref Range Status   Specimen Description BLOOD LEFT ANTECUBITAL  Final   Special Requests BOTTLES DRAWN AEROBIC AND ANAEROBIC 5CC   Final   Culture NO GROWTH 1 DAY  Final   Report Status PENDING  Incomplete  Culture, blood (routine x 2)     Status: None (Preliminary result)   Collection Time: 11/27/14  4:24 AM  Result Value Ref Range Status   Specimen Description BLOOD LEFT ARM  Final   Special Requests BOTTLES DRAWN AEROBIC AND ANAEROBIC 5CC   Final   Culture NO GROWTH 1 DAY  Final   Report Status PENDING  Incomplete  Gram stain     Status: None   Collection Time: 11/27/14 10:45 AM  Result Value Ref Range Status   Specimen Description URINE, RANDOM  Final   Special Requests NONE  Final   Gram Stain   Final    WBC PRESENT,BOTH PMN AND MONONUCLEAR BUDDING YEAST SEEN CYTOSPIN SMEAR    Report Status 11/27/2014 FINAL  Final  Culture, sputum-assessment     Status: None   Collection Time: 11/29/14  2:02 AM  Result Value Ref Range Status   Specimen Description SPUTUM  Final   Special Requests NONE  Final   Sputum evaluation   Final    THIS SPECIMEN IS ACCEPTABLE. RESPIRATORY CULTURE REPORT TO FOLLOW.   Report Status 11/29/2014 FINAL  Final     Studies: Dg Knee Left Port  11/28/2014  CLINICAL DATA:  Fall. EXAM:  PORTABLE LEFT KNEE - 1-2 VIEW COMPARISON:  None. FINDINGS: Exam detail is diminished secondary to body habitus and suboptimal patient positioning. A left knee arthroplasty device is identified. There is no fracture or subluxation identified. IMPRESSION: 1. Diminished exam detail secondary to large body habitus. 2. No acute findings noted. Electronically Signed   By: Signa Kell M.D.   On: 11/28/2014 10:28   Dg Knee Right Port  11/28/2014  CLINICAL DATA:  Large body habitus. Fall. History of right knee arthroplasty. EXAM: PORTABLE RIGHT KNEE - 1-2 VIEW COMPARISON:  None. FINDINGS: Previous right total knee arthroplasty. No evidence for joint effusion. No periprosthetic fracture or dislocation. IMPRESSION: 1. No  acute findings noted. Electronically Signed   By: Signa Kellaylor  Stroud M.D.   On: 11/28/2014 10:18   Dg Humerus Right  11/28/2014  CLINICAL DATA:  Fall.  Difficulty raising arm. EXAM: RIGHT HUMERUS - 2+ VIEW COMPARISON:  None. FINDINGS: There is no evidence of fracture or other focal bone lesions. Soft tissues are unremarkable. IMPRESSION: Negative. Electronically Signed   By: Signa Kellaylor  Stroud M.D.   On: 11/28/2014 10:09    Scheduled Meds: . atorvastatin  80 mg Oral q1800  . azithromycin  500 mg Intravenous Q24H  . budesonide-formoterol  2 puff Inhalation BID  . cefTRIAXone (ROCEPHIN)  IV  1 g Intravenous Q24H  . clopidogrel  75 mg Oral Daily  . enoxaparin (LOVENOX) injection  150 mg Subcutaneous Q12H  . ferrous fumarate  1 tablet Oral Daily  . insulin aspart  0-15 Units Subcutaneous 4 times per day  . ipratropium-albuterol  3 mL Nebulization TID  . metoprolol tartrate  12.5 mg Oral BID  . mirabegron ER  25 mg Oral Daily  . pantoprazole  40 mg Oral Daily  . Warfarin - Pharmacist Dosing Inpatient   Does not apply q1800   Continuous Infusions:   Time spent: 35 minutes  Skyra Crichlow L  [facility] [email or web address], password [email or web address] 11/29/2014, 12:59 PM  LOS: 3 days

## 2014-11-29 NOTE — Care Management Important Message (Signed)
Important Message  Patient Details  Name: [name] LamasLinda C [name] MRN: [id number] Date of Birth: 03-21-1945   Medicare Important Message Given:  Yes-second notification given    Kyla BalzarineShealy, Imanni Burdine Abena 11/29/2014, 10:19 AM

## 2014-11-29 NOTE — Progress Notes (Signed)
Patient Name: [name] ShaggyLinda C [name] Date of Encounter: 11/29/2014  Principal Problem:   Ankle fracture, right Active Problems:   Dislocation of right ankle joint   Bilateral pneumonia   Acute on chronic diastolic CHF (congestive heart failure) (HCC)   NSTEMI (non-ST elevated myocardial infarction) (HCC)   Obesity   Sleep apnea   COPD (chronic obstructive pulmonary disease) (HCC)   Chronic venous insufficiency   Recurrent pulmonary embolism (HCC)   Chronic anticoagulation   Diabetes mellitus [facility](HCC)   Primary Cardiologist: New, Previously seen by Dr. Andee LinemaneGent in [place].  Patient Profile: 69 y.o female with PMH of HTN, HLD, Type 2 DM, morbid obesity, chronic diastolic CHF, and no known CAD, admitted  10/30 for ankle fracture secondary to a fall and underwent surgical repair. Postop she developed dyspnea, Troponin 2.12.  SUBJECTIVE: Coughing up some phlegm today. No chest pain, no SOB at rest. Does not move self around in the bed very well.   OBJECTIVE Filed Vitals:   11/28/14 2011 11/29/14 0530 11/29/14 0618 11/29/14 0741  BP: 126/59 136/69    Pulse: 75 67    Temp: 99.4 F (37.4 C) 97.7 F (36.5 C)    TempSrc: Oral Oral    Resp: 16 18    Height:      Weight:   343 lb 9.6 oz (155.856 kg)   SpO2: 91% 93%  98%    Intake/Output Summary (Last 24 hours) at 11/29/14 0851 Last data filed at 11/29/14 0531  Gross per 24 hour  Intake    480 ml  Output    250 ml  Net    230 ml   Filed Weights   11/26/14 2335 11/29/14 0618  Weight: 341 lb 9.6 oz (154.949 kg) 343 lb 9.6 oz (155.856 kg)    PHYSICAL EXAM General: Well developed, well nourished, female in no acute distress. Head: Normocephalic, atraumatic.  Neck: Supple without bruits, JVD not clearly elevated. Lungs:  Resp regular and unlabored, rales bases. Heart: RRR, S1, S2, no S3, S4, soft murmur; no rub. Abdomen: Soft, non-tender, non-distended, BS + x 4.  Extremities: No clubbing, cyanosis, trace edema.  Neuro: Alert and  oriented X 3. Moves all extremities spontaneously. Psych: Normal affect.  LABS: CBC: Recent Labs  11/27/14 0445 11/28/14 0405  WBC 14.8* 12.4*  NEUTROABS 12.4*  --   HGB 12.2 12.0  HCT 40.4 40.5  MCV 91.0 91.8  PLT 214 235   INR: Recent Labs  11/29/14 0541  INR 1.75*   Basic Metabolic Panel: Recent Labs  11/27/14 0445 11/28/14 0405  NA 133* 140  K 5.3* 3.9  CL 96* 98*  CO2 27 34*  GLUCOSE 155* 172*  BUN 16 14  CREATININE 0.86 0.92  CALCIUM 8.4*  8.6* 8.7*  MG 1.7  --   PHOS 3.8  --    Liver Function Tests: Recent Labs  11/27/14 0445 11/28/14 0405  AST 56* 32  ALT 29 19  ALKPHOS 64 58  BILITOT 1.6* 1.0  PROT 5.5* 5.9*  ALBUMIN 2.6* 2.6*   Cardiac Enzymes: Recent Labs  11/27/14 0443 11/27/14 1120 11/27/14 1550  TROPONINI 2.12* 1.85* 1.71*   BNP:  B NATRIURETIC PEPTIDE  Date/Time Value Ref Range Status  11/27/2014 07:11 AM 163.5* 0.0 - 100.0 pg/mL Final    Recent Labs  11/27/14 0445  HGBA1C 7.3*   Fasting Lipid Panel: Recent Labs  11/28/14 0405  CHOL 103  HDL 26*  LDLCALC 59  TRIG 92  CHOLHDL  4.0   Thyroid Function Tests: Recent Labs  11/27/14 0445 11/28/14 0405  TSH 9.347*  --   T3FREE  --  1.8*   TELE: SR, no sig ectopy       ECHO: 11/28/2014   Radiology/Studies: Ct Ankle Right Wo Contrast  11/27/2014  CLINICAL DATA:  Follow up right ankle fracture post external fixation. EXAM: CT OF THE RIGHT ANKLE WITHOUT CONTRAST TECHNIQUE: Multidetector CT imaging of the right ankle was performed according to the standard protocol. Multiplanar CT image reconstructions were also generated. COMPARISON:  Radiographs 11/27/2014. FINDINGS: External fixators traverse the mid tibial diaphysis and the calcaneal tuberosity. There is a rod traversing the subtalar and tibiotalar joints. The tibiotalar dislocation has been reduced. There is a comminuted intra-articular fracture of the distal tibia. There is involvement of the articular surface  posteriorly, situated in the coronal plane. This fracture demonstrates up to 4 mm displacement medially and up to 2 mm of impaction of the articular surface posteriorly. Fracture extends into the base of the medial malleolus. Laterally, the fracture extends into the distal tibiofibular articulation. There are small intra-articular fracture fragments. No significant widening of the ankle mortise is demonstrated. There is a comminuted fracture of the distal tibial diaphysis with a 4.6 cm butterfly fragment anteriorly. This fracture is only mildly displaced. The talar dome appears intact. No acute fractures are identified within the hindfoot. There is a possible nondisplaced fracture involving the base of the second metatarsal. Mild midfoot degenerative changes are present. There is no significant ankle joint effusion. Mild subcutaneous edema is present within lower leg and hindfoot. There are extensive vascular calcifications. IMPRESSION: 1. Improved alignment of comminuted intra-articular fracture of the distal tibia status post fixation. There is mild displacement and depression of the articular surface of the tibial plafond posteriorly. 2. Comminuted fracture of the distal fibular diaphysis demonstrates minimal displacement. 3. Possible nondisplaced fracture of the second metatarsal base. Electronically Signed   By: Carey Bullocks M.D.   On: 11/27/2014 12:18   Dg Knee Left Port  11/28/2014  CLINICAL DATA:  Fall. EXAM: PORTABLE LEFT KNEE - 1-2 VIEW COMPARISON:  None. FINDINGS: Exam detail is diminished secondary to body habitus and suboptimal patient positioning. A left knee arthroplasty device is identified. There is no fracture or subluxation identified. IMPRESSION: 1. Diminished exam detail secondary to large body habitus. 2. No acute findings noted. Electronically Signed   By: Signa Kell M.D.   On: 11/28/2014 10:28   Dg Knee Right Port  11/28/2014  CLINICAL DATA:  Large body habitus. Fall. History  of right knee arthroplasty. EXAM: PORTABLE RIGHT KNEE - 1-2 VIEW COMPARISON:  None. FINDINGS: Previous right total knee arthroplasty. No evidence for joint effusion. No periprosthetic fracture or dislocation. IMPRESSION: 1. No acute findings noted. Electronically Signed   By: Signa Kell M.D.   On: 11/28/2014 10:18   Dg Humerus Right  11/28/2014  CLINICAL DATA:  Fall.  Difficulty raising arm. EXAM: RIGHT HUMERUS - 2+ VIEW COMPARISON:  None. FINDINGS: There is no evidence of fracture or other focal bone lesions. Soft tissues are unremarkable. IMPRESSION: Negative. Electronically Signed   By: Signa Kell M.D.   On: 11/28/2014 10:09     Current Medications:  . atorvastatin  80 mg Oral q1800  . azithromycin  500 mg Intravenous Q24H  . budesonide-formoterol  2 puff Inhalation BID  . cefTRIAXone (ROCEPHIN)  IV  1 g Intravenous Q24H  . clopidogrel  75 mg Oral Daily  . enoxaparin (LOVENOX)  injection  150 mg Subcutaneous Q12H  . ferrous fumarate  1 tablet Oral Daily  . insulin aspart  0-15 Units Subcutaneous 4 times per day  . ipratropium-albuterol  3 mL Nebulization TID  . metoprolol tartrate  12.5 mg Oral BID  . mirabegron ER  25 mg Oral Daily  . pantoprazole  40 mg Oral Daily  . Warfarin - Pharmacist Dosing Inpatient   Does not apply q1800      ASSESSMENT AND PLAN: 1. NSTEMI  - Troponin values have been 2.12, 1.85, and 1.71 here, higher than at [facility] - has experienced episodes of dyspnea. Denies any recent or current chest pain. - Treating medically for now. Plavix has been initiated. Nitrates not needed  -  With Coumadin anticoagulation, no ASA at this time to avoid triple therapy.  - Pending echo results, may need further evaluation with NST or cardiac catheterization. NST would likely not be very sensitive due to patient's body habitus, even with 2-day study. Would lean more toward obtaining cath if further invasive workup is needed. - Continue statin and low-dose BB.  2.  Acute on chronic diastolic heart failure  - pulmonary edema noted on CXR. Given  IV Lasix x 1 10/30 - BNP 163.5 on 11/27/2014. - echocardiogram is ordered. - Feel she may benefit from additional Lasix, possibly daily PO dose. MD advise.  3. Bilateral pneumonia with possible sepsis - continue Abx treatment - per primary team  4. Sleep apnea  - not on CPAP due to being associated with asthma exacerbations according to the patient. - per IM  5. Recurrent PE in 2004, 2006 - lovenox >>warfarin  - per IM  6. Right Ankle Fx - s/p closed reduction and external fixation on 11/27/2014. - per Ortho/IM  Otherwise, per IM, Ortho. Principal Problem:   Ankle fracture, right Active Problems:   Dislocation of right ankle joint   Bilateral pneumonia   Acute on chronic diastolic CHF (congestive heart failure) (HCC)   NSTEMI (non-ST elevated myocardial infarction) (HCC)   Obesity   Sleep apnea   COPD (chronic obstructive pulmonary disease) (HCC)   Chronic venous insufficiency   Recurrent pulmonary embolism (HCC)   Chronic anticoagulation   Diabetes mellitus (HCC)   Signed, Barrett, Rhonda , PA-C 8:51 AM 11/29/2014  Personally seen and examined. Agree with above.  NSTEMI  - medical mgt. Plavix, coumadin (h/o PE's), Bb, statin. No ASA at this point to avoid triple therapy and increased bleeding risk.   - OK to rehab  - seems to be breathing well. Will hold of on daily lasix at this point.   - awaiting ECHO  Donato Schultz, MD

## 2014-11-29 NOTE — Progress Notes (Cosign Needed)
Orthopaedic Trauma Service Progress Note  Subjective  Still [name]/o R shoulder pain and decreased function  R ankle doing well  ROS As above  Objective   BP 136/69 mmHg  Pulse 67  Temp(Src) 97.7 F (36.5 [name]) (Oral)  Resp 18  Ht 5\' 2"  (1.575 m)  Wt 155.856 kg (343 lb 9.6 oz)  BMI 62.83 kg/m2  SpO2 98%  Intake/Output      10/31 0701 - 11/01 0700 11/01 0701 - 11/02 0700   P.O. 480    Total Intake(mL/kg) 480 (3.1)    Urine (mL/kg/hr) 250 (0.1)    Total Output 250     Net +230            Labs  Results for [name], [name] (MRN [id number]) as of 11/29/2014 17:49  Ref. Range 11/28/2014 04:05  Free T4 Latest Ref Range: 0.61-1.12 ng/dL 4.091.05  T3, Free Latest Ref Range: 2.0-4.4 pg/mL 1.8 (L)  Results for [name], [name] (MRN [id number]) as of 11/29/2014 17:49  Ref. Range 11/27/2014 04:45  Vit D, 25-Hydroxy Latest Ref Range: 30.0-100.0 ng/mL 31.6  Results for [name], [name] (MRN [id number]) as of 11/29/2014 17:49  Ref. Range 11/27/2014 04:45  Hemoglobin A1C Latest Ref Range: 4.8-5.6 % 7.3 (H)   Results for [name], [name] (MRN [id number]) as of 11/29/2014 17:49  Ref. Range 11/27/2014 04:45  TSH Latest Ref Range: 0.350-4.500 uIU/mL 9.347 (H)   CBG (last 3)   Recent Labs  11/29/14 0043 11/29/14 0500 11/29/14 1140  GLUCAP 175* 139* 154*      Exam  Gen: resting in bed, NAD    Right Upper Extremity             Passive ROM intact             still Unable to actively lift or hold up arm by herself             Ext warm             + Radial pulse    Right Lower Leg             Ex fix stable             Swelling stable             Dressing intact, some discharge from pinsites             Ext warm             + DP pulse  Assessment and Plan   POD/HD#: 2   69 y/o morbidly obese woman, s/p fall Friday 11/25/2014 at hospital in [place], Transferred [facility] 11/27/2014  1. Right ankle posterior dislocation and trimalleolar fracture s/p Ex fix 11/27/2014             Continue ice  and elevation             NWB R LEx             PT/OT evals             Dressing change tomorrow               Considering treating definitively in external fixator if her soft tissue can handle it      R arm pain and decreased function             Xray from [facility] negative for fracture (10/29)  R humerus films negative here  Moderate arthritis               Suspect Rotator cuff pathology               Continue to follow             Do not think MRI would provide useful information at this time. Continue to follow clinically                             xrays B TKA are stable   2. Pain management:             Continue current regimen  3. ABL anemia/Hemodynamics             Stable  4. Medical issues               Home meds             Sliding scale insulin  HgbA1c elevated   5. DVT/PE prophylaxis:             Hx of blood clots in 2004 and 2006             lovenox, coumadin and plavix   6. ID:               Completed periop antibiotics  7. Metabolic Bone Disease:             vitamin d low normal    Will supplement   Levels >45 have be shown to improve muscle function and decrease risk of falls               A1c elevated   Improve sugar control               8. Activity:             PT/OT evals             Up to chair today, may need lift to help  9. FEN/Foley/Lines:             CHO modified diet  10. Ex-fix care:             Will change dressing tomorrow              11. Impediments to fracture healing:             Diabetes mellitus   12. Dispo:             PT/OT eval    Will likely need snf     Mearl Latin, PA-[name] [facility] [phone] (O) 11/29/2014 11:14 AM

## 2014-11-29 NOTE — Clinical Social Work Note (Signed)
Patient has a bed at [facility] [place] once medically stable for discharge.  CSW remains available as needed.   Derenda FennelBashira Kendyl Festa, MSW, LCSWA [phone] 11/29/2014 2:56 PM

## 2014-11-29 NOTE — Progress Notes (Signed)
Occupational Therapy Treatment Patient Details Name: [name] MRN: [id number] [name] 1947 Today's Date: 11/29/2014    History of present illness [name] is a 69 y.o. female with Past medical history of obesity, hypertension, sleep apnea, dyslipidemia, chronic venous insufficiency, diastolic dysfunction, recurrent pulmonary embolism on chronic anticoagulation. Who fell at home and 2 days later presented with ankle pain with fx. S/p ex fix placement 10/30   OT comments  Used Maximove to mobilize pt OOB to recliner. Nsg educated on use and supporting R LE during transfer. Maximove to be positioned from the side of the chair in order to avoid having to cross support bar with BLE. Pt very excited about being OOB.   Follow Up Recommendations  SNF;Supervision/Assistance - 24 hour    Equipment Recommendations  Other (comment) (TBA at next venue)    Recommendations for Other Services      Precautions / Restrictions Precautions Precautions: Fall Precaution Comments: at risk for skin breakdown; No R shoulder movement (new) Restrictions RLE Weight Bearing: Non weight bearing       Mobility Bed Mobility               General bed mobility comments: Pt assisting with bed mobility by pulling on rails as she is able. +2Mod A  Transfers                 General transfer comment: Pt mobilized with use of maximove to recliner+3 assist used for safety and to support RLE    Balance                                   ADL   Eating/Feeding: Set up Eating/Feeding Details (indicate cue type and reason): difficulty with opening containers due to R shoulder deficits                                          Vision                     Perception     Praxis      Cognition   Behavior During Therapy: [facility] for tasks assessed/performed Overall Cognitive Status: Within Functional Limits for tasks assessed                        Extremity/Trunk Assessment   No active shoulder movement observed. PROM WFL. All other AROM WFL. No c/o pain            Exercises General Exercises - Upper Extremity Shoulder Flexion: PROM;Right;10 reps Shoulder ABduction: PROM;Right;10 reps Elbow Flexion: AROM;Right;10 reps Elbow Extension: AROM;Right;10 reps   Shoulder Instructions       General Comments      Pertinent Vitals/ Pain       Pain Assessment: 0-10 Pain Score: 2  Pain Location: R ankle Pain Descriptors / Indicators: Burning Pain Intervention(s): Limited activity within patient's tolerance;Repositioned;Ice applied  Home Living                                          Prior Functioning/Environment              Frequency Min 3X/week  Progress Toward Goals  OT Goals(current goals can now be found in the care plan section)  Progress towards OT goals: Progressing toward goals;OT to reassess next treatment  Acute Rehab OT Goals Patient Stated Goal: to get better OT Goal Formulation: With patient Time For Goal Achievement: 12/12/14 Potential to Achieve Goals: Fair ADL Goals Pt Will Perform Grooming: with supervision;sitting Pt Will Perform Lower Body Bathing: sit to/from stand;with mod assist Pt Will Perform Lower Body Dressing: sit to/from stand;with mod assist Pt Will Transfer to Toilet: with mod assist;stand pivot transfer;bedside commode Pt Will Perform Toileting - Clothing Manipulation and hygiene: with mod assist;sit to/from stand Additional ADL Goal #1: Pt will tolerate R footplate splint without problems to maintain foot at neutral position. Additional ADL Goal #2: staff will be independent with donning/doffing R footplate Additional ADL Goal #3: Staff will be independent with mobilizing pt using maximove daily Additional ADL Goal #4: Pt will complete RUE A/AA/PROM to increase ROM and strength with min A  Plan Discharge plan remains appropriate     Co-evaluation                 End of Session     Activity Tolerance Patient tolerated treatment well   Patient Left in chair;with call bell/phone within reach   Nurse Communication Mobility status;Need for lift equipment;Precautions;Weight bearing status        Time: 1520-1606 OT Time Calculation (min): 46 min  Charges: OT General Charges $OT Visit: 1 Procedure OT Treatments $Therapeutic Activity: 38-52 mins  [name],[name] 11/29/2014, 4:27 PM   [facility] Clare Fennimore, OTR/L  [phone] 11/29/2014

## 2014-11-29 NOTE — Progress Notes (Signed)
  Echocardiogram 2D Echocardiogram with Definity has been performed.  Nolon RodBrown, Tony 11/29/2014, 2:00 PM

## 2014-11-29 NOTE — Clinical Documentation Improvement (Signed)
Internal Medicine  H&P states "probable Sepsis" and multiple Notes state "possible Sepsis". Please document in next Note if "Sepsis" has been ruled in or out.   Sepsis  Other  Clinically Undetermined  Supporting Information: -- On admit with "bilateral pneumonia with probable Sepsis" and venous congestion cellulitis -- WBC 14.8 -- Temp 98.1, 97.7, 98.3, HR 78-88-92, RR 18-14-20 -- O2 requirement 2-4L [place] with Sat 83% on RA -- Treated with Vancomycin and Zosyn  Please exercise your independent, professional judgment when responding. A specific answer is not anticipated or expected.   Thank You, Beverley FiedlerLaurie E Alajia Schmelzer [facility] [phone]

## 2014-11-29 NOTE — Progress Notes (Signed)
ANTICOAGULATION CONSULT NOTE - Follow Up Consult  Pharmacy Consult for LMWH and Coumadin Indication: hx PE x 2   Patient Measurements: Height: 5\' 2"  (157.5 cm) Weight: (!) 343 lb 9.6 oz (155.856 kg) IBW/kg (Calculated) : 50.1   Vital Signs: Temp: 99 F (37.2 C) (11/01 1356) Temp Source: Oral (11/01 0530) BP: 119/63 mmHg (11/01 1356) Pulse Rate: 71 (11/01 1356)  Labs:  Recent Labs  11/27/14 0443 11/27/14 0445 11/27/14 1120 11/27/14 1550 11/28/14 0405 11/29/14 0541  HGB  --  12.2  --   --  12.0  --   HCT  --  40.4  --   --  40.5  --   PLT  --  214  --   --  235  --   APTT 47*  --   --   --   --   --   LABPROT 20.3*  --   --   --   --  20.4*  INR 1.74*  --   --   --   --  1.75*  CREATININE  --  0.86  --   --  0.92  --   TROPONINI 2.12*  --  1.85* 1.71*  --   --      Assessment:  [age] yo F on warfarin PTA for history of PE. Pharmacy has been consulted to dose enoxaparin and coumadin with INR below goal. She is also noted with NSTEMI now on plavix (avoiding triple therapy due to bleeding risk) -INR= 1.75  Home coumadin dose: 4mg /day except take 6mg  on MF.  Goal of Therapy:  INR 2-3 Monitor platelets by anticoagulation protocol: Yes   Plan:  -Coumadin 6mg  po today - Continue Lovenox SQ 150 mg q12h (1 mg/kg q 12h) -Daily PT/INR  Harland GermanAndrew Severiano Utsey, Pharm D 11/29/2014 2:29 PM

## 2014-11-29 NOTE — Progress Notes (Signed)
Nurse contacted Lendell CapriceSullivan, MD concerning Foley catheter instructions. MD initially placed an order to D/C foley. Then nurse contacted physician again stating that patient is unable to ambulate, turn effectively, and has an overactive bladder. The patient is has an ex fix in place on RLE and RUE is limited due to pre-admission fall. The patient requests to keep the foley in place. The MD is aware and stated that an order would be initiated. Awaiting for an official order for Foley care. Will continue to monitor patient.

## 2014-11-30 ENCOUNTER — Other Ambulatory Visit (HOSPITAL_COMMUNITY): Payer: Medicare Other

## 2014-11-30 DIAGNOSIS — S82891A Other fracture of right lower leg, initial encounter for closed fracture: Secondary | ICD-10-CM

## 2014-11-30 DIAGNOSIS — J189 Pneumonia, unspecified organism: Secondary | ICD-10-CM

## 2014-11-30 DIAGNOSIS — E118 Type 2 diabetes mellitus with unspecified complications: Secondary | ICD-10-CM

## 2014-11-30 LAB — BASIC METABOLIC PANEL
ANION GAP: 7 (ref 5–15)
BUN: 12 mg/dL (ref 6–20)
CALCIUM: 9.2 mg/dL (ref 8.9–10.3)
CO2: 32 mmol/L (ref 22–32)
Chloride: 95 mmol/L — ABNORMAL LOW (ref 101–111)
Creatinine, Ser: 0.73 mg/dL (ref 0.44–1.00)
GFR calc Af Amer: >60 mL/min (ref >60)
GFR calc non Af Amer: >60 mL/min (ref >60)
GLUCOSE: 129 mg/dL — AB (ref 65–99)
Potassium: 4 mmol/L (ref 3.5–5.1)
Sodium: 134 mmol/L — ABNORMAL LOW (ref 135–145)

## 2014-11-30 LAB — PROTIME-INR
INR: 1.87 — ABNORMAL HIGH (ref 0.00–1.49)
PROTHROMBIN TIME: 21.5 s — AB (ref 11.6–15.2)

## 2014-11-30 LAB — GLUCOSE, CAPILLARY
GLUCOSE-CAPILLARY: 149 mg/dL — AB (ref 65–99)
Glucose-Capillary: 122 mg/dL — ABNORMAL HIGH (ref 65–99)
Glucose-Capillary: 128 mg/dL — ABNORMAL HIGH (ref 65–99)

## 2014-11-30 MED ORDER — POLYETHYLENE GLYCOL 3350 17 GM/SCOOP PO POWD: 17.0000 g | Freq: Every day | ORAL | Status: AC

## 2014-11-30 MED ORDER — NITROGLYCERIN 0.4 MG SL SUBL: 0.4000 mg | SUBLINGUAL_TABLET | SUBLINGUAL | Status: AC | PRN

## 2014-11-30 MED ORDER — AZITHROMYCIN 500 MG PO TABS
500.0000 mg | ORAL_TABLET | Freq: Every day | ORAL | Status: DC
Start: 2014-11-30 — End: 2014-12-15

## 2014-11-30 MED ORDER — LORAZEPAM 0.5 MG PO TABS: 0.5000 mg | ORAL_TABLET | Freq: Every day | ORAL | Status: AC

## 2014-11-30 MED ORDER — ATORVASTATIN CALCIUM 80 MG PO TABS: 80.0000 mg | ORAL_TABLET | Freq: Every day | ORAL | Status: AC

## 2014-11-30 MED ORDER — OXYCODONE-ACETAMINOPHEN 10-325 MG PO TABS: 1.0000 | ORAL_TABLET | Freq: Four times a day (QID) | ORAL | Status: AC | PRN

## 2014-11-30 MED ORDER — WARFARIN SODIUM 6 MG PO TABS
6.0000 mg | ORAL_TABLET | Freq: Once | ORAL | Status: AC
  Administered 2014-11-30: 6 mg via ORAL
  Filled 2014-11-30: qty 1

## 2014-11-30 MED ORDER — CHOLECALCIFEROL 50 MCG (2000 UT) PO TABS: 2000.0000 [IU] | ORAL_TABLET | Freq: Two times a day (BID) | ORAL | Status: AC

## 2014-11-30 MED ORDER — BISACODYL 10 MG RE SUPP
10.0000 mg | Freq: Once | RECTAL | Status: AC
  Administered 2014-11-30: 10 mg via RECTAL
  Filled 2014-11-30: qty 1

## 2014-11-30 MED ORDER — METOPROLOL TARTRATE 25 MG PO TABS: 12.5000 mg | ORAL_TABLET | Freq: Two times a day (BID) | ORAL | Status: AC

## 2014-11-30 MED ORDER — MAGNESIUM CITRATE PO SOLN
1.0000 | Freq: Once | ORAL | Status: AC
  Administered 2014-11-30: 1 via ORAL
  Filled 2014-11-30: qty 296

## 2014-11-30 MED ORDER — ENOXAPARIN SODIUM 150 MG/ML ~~LOC~~ SOLN: 150.0000 mg | Freq: Two times a day (BID) | SUBCUTANEOUS | Status: DC

## 2014-11-30 MED ORDER — CLOPIDOGREL BISULFATE 75 MG PO TABS: 75.0000 mg | ORAL_TABLET | Freq: Every day | ORAL | Status: AC

## 2014-11-30 NOTE — Progress Notes (Signed)
Orthopaedic Trauma Service Progress Note  Subjective  Doing ok  Concerned about her R ankle   Review of Systems  Respiratory:       Feels tight- getting breathing tx now   Cardiovascular: Negative for chest pain.  Gastrointestinal: Negative for nausea and vomiting.  Musculoskeletal: Positive for joint pain (R ankle ).  Neurological: Negative for tingling and sensory change.     Objective   BP 121/57 mmHg  Pulse 73  Temp(Src) 98.1 F (36.7 C) (Oral)  Resp 20  Ht 5\' 2"  (1.575 m)  Wt 155.856 kg (343 lb 9.6 oz)  BMI 62.83 kg/m2  SpO2 98%  Intake/Output      11/01 0701 - 11/02 0700 11/02 0701 - 11/03 0700   P.O. 480 200   Total Intake(mL/kg) 480 (3.1) 200 (1.3)   Urine (mL/kg/hr) 1000 (0.3)    Total Output 1000     Net -520 +200          Labs  Hgba1c: 7.3  CBG (last 3)   Recent Labs  11/29/14 1140 11/29/14 1754 11/30/14 0020  GLUCAP 154* 191* 128*      Exam  ZOX:[id number]:resting comfortably in bed, NAD, breathing tx   Right Ankle Exam   Comments:  Right Lower Extremity    Ex fix is stable   pinsites look great   Ext warm    Skin stable, it is dry   No signs of infection   DPN, SPN, TN sens grossly intact   EHL, FHL, lesser toe motor functions intact        Assessment and Plan    POD/HD#: 323   69 y/o morbidly obese woman, s/p fall Friday 11/25/2014 at hospital in [place], [facility]  1. Right ankle posterior dislocation and trimalleolar fracture s/p Ex fix 11/27/2014             Continue ice and elevation             NWB R LEx             PT/OT evals             Dressing changed   Daily pinsite care as needed   Clean pinsites with soap and water, wrap pins with kerlix, ace to ankle               Considering treating definitively in external fixator if her soft tissue can handle it      R arm pain and decreased function             Xray from [facility] negative for fracture (10/29)             R humerus films negative  here               Moderate arthritis               Suspect Rotator cuff pathology               Continue to follow             Do not think MRI would provide useful information at this time. Continue to follow clinically                                         xrays B TKA are stable   2. Pain management:  Continue current regimen  3. ABL anemia/Hemodynamics             Stable  4. Medical issues               Home meds             Sliding scale insulin             HgbA1c elevated   5. DVT/PE prophylaxis:             Hx of blood clots in 2004 and 2006             lovenox, coumadin and plavix   6. ID:               Completed periop antibiotics  7. Metabolic Bone Disease:             vitamin d low normal                           Will supplement                         Levels >45 have be shown to improve muscle function and decrease risk of falls               A1c elevated                         Improve sugar control                8. Activity:             PT/OT evals             Up to chair today, may need lift to help  9. FEN/Foley/Lines:             CHO modified diet  10. Ex-fix care:            change daily or every other day depending on drainage  11. Impediments to fracture healing:             Diabetes mellitus   12. Dispo:             SNF   Ok for dc from ortho standpoint  Follow up in 2 weeks at office   Please dc on vitamin D that has been ordered    [name] Latin, PA-C [facility] [phone] (O) 11/30/2014 9:35 AM

## 2014-11-30 NOTE — Discharge Instructions (Addendum)
Nonweightbearing Right leg  DISCHARGE WOUND CARE INSTRUCTIONS   Discharge Pin Site Instructions  Dress pins daily with Kerlix roll starting on POD 2. Wrap the Kerlix so that it tamps the skin down around the pin-skin interface to prevent/limit motion of the skin relative to the pin.  (Pin-skin motion is the primary cause of pain and infection related to external fixator pin sites).  Remove any crust or coagulum that may obstruct drainage with a saline moistened gauze or soap and water.  After POD 3, if there is no discernable drainage on the pin site dressing, the interval for change can by increased to every other day.  You may shower with the fixator, cleaning all pin sites gently with soap and water.  If you have a surgical wound this needs to be completely dry and without drainage before showering.  The extremity can be lifted by the fixator to facilitate wound care and transfers.  Notify the office/Doctor if you experience increasing drainage, redness, or pain from a pin site, or if you notice purulent (thick, snot-like) drainage.  Discharge Wound Care Instructions  Do NOT apply any ointments, solutions or lotions to pin sites or surgical wounds.  These prevent needed drainage and even though solutions like hydrogen peroxide kill bacteria, they also damage cells lining the pin sites that help fight infection.  Applying lotions or ointments can keep the wounds moist and can cause them to breakdown and open up as well. This can increase the risk for infection. When in doubt call the office.  Surgical incisions should be dressed daily.  If any drainage is noted, use one layer of adaptic, then gauze, Kerlix, and an ace wrap.  Once the incision is completely dry and without drainage, it may be left open to air out.  Showering may begin 36-48 hours later.  Cleaning gently with soap and water.  Traumatic wounds should be dressed daily as well.    One layer of adaptic, gauze, Kerlix, then  ace wrap.  The adaptic can be discontinued once the draining has ceased    If you have a wet to dry dressing: wet the gauze with saline the squeeze as much saline out so the gauze is moist (not soaking wet), place moistened gauze over wound, then place a dry gauze over the moist one, followed by Kerlix wrap, then ace wrap.  CALL OFFICE WITH QUESTIONS OR CONCERNS [phone]

## 2014-11-30 NOTE — Progress Notes (Signed)
Pt ready for d/c per MD. Pt had large BM after given magnesium citrate and dulcolax suppository, stated abdominal discomfort was relieved. [facility] [place], all questions were answered. Peripheral IVs removed, belongings will be gathered and sent with EMS. Pt waiting for transportation via [facility]. Will continue to monitor.  Lowella DellHudson, Megon Kalina G 11/30/2014 2:21 PM

## 2014-11-30 NOTE — Progress Notes (Addendum)
Patient Name: [name] Date of Encounter: 11/30/2014  Principal Problem:   Ankle fracture, right Active Problems:   Dislocation of right ankle joint   Bilateral pneumonia   Acute on chronic diastolic CHF (congestive heart failure) (HCC)   NSTEMI (non-ST elevated myocardial infarction) (HCC)   Obesity   Sleep apnea   COPD (chronic obstructive pulmonary disease) (HCC)   Chronic venous insufficiency   Recurrent pulmonary embolism (HCC)   Chronic anticoagulation   Diabetes mellitus [facility](HCC)   Primary Cardiologist: New, Previously seen by Dr. Andee Lin[place]aneGent [place].  Patient Profile: 69 y.o female with PMH of HTN, HLD, Type 2 DM, morbid obesity, chronic diastolic CHF, and no known CAD, admitted  10/30 for ankle fracture secondary to a fall and underwent surgical repair. Postop she developed dyspnea, Troponin 2.12.  SUBJECTIVE: No chest pain, no SOB at rest. Does not move self around in the bed very well. Right shoulder hurts.   OBJECTIVE Filed Vitals:   11/29/14 1410 11/29/14 2005 11/30/14 0530 11/30/14 0843  BP:  142/70 121/57   Pulse:  73    Temp:  98.8 F (37.1 C) 98.1 F (36.7 C)   TempSrc:  Oral Oral   Resp:  20 20   Height:      Weight:      SpO2: 94% 93% 95% 98%    Intake/Output Summary (Last 24 hours) at 11/30/14 1045 Last data filed at 11/30/14 1000  Gross per 24 hour  Intake    440 ml  Output   1750 ml  Net  -1310 ml   Filed Weights   11/26/14 2335 11/29/14 0618  Weight: 341 lb 9.6 oz (154.949 kg) 343 lb 9.6 oz (155.856 kg)    PHYSICAL EXAM General: Well developed, well nourished, female in no acute distress. Head: Normocephalic, atraumatic.  Neck: Supple without bruits, JVD not clearly elevated. Lungs:  Resp regular and unlabored, rales bases. Heart: RRR, S1, S2, no S3, S4, soft murmur; no rub. Abdomen: Soft, non-tender, non-distended, BS + x 4.  Extremities: No clubbing, cyanosis, trace edema.  Neuro: Alert and oriented X 3. Moves all extremities  spontaneously. Psych: Normal affect.  LABS: CBC:  Recent Labs  11/28/14 0405  WBC 12.4*  HGB 12.0  HCT 40.5  MCV 91.8  PLT 235   INR:  Recent Labs  11/30/14 0612  INR 1.87*   Basic Metabolic Panel:  Recent Labs  78/29/5610/31/16 0405 11/30/14 0612  NA 140 134*  K 3.9 4.0  CL 98* 95*  CO2 34* 32  GLUCOSE 172* 129*  BUN 14 12  CREATININE 0.92 0.73  CALCIUM 8.7* 9.2   Liver Function Tests:  Recent Labs  11/28/14 0405  AST 32  ALT 19  ALKPHOS 58  BILITOT 1.0  PROT 5.9*  ALBUMIN 2.6*   Cardiac Enzymes:  Recent Labs  11/27/14 1120 11/27/14 1550  TROPONINI 1.85* 1.71*   BNP:  B NATRIURETIC PEPTIDE  Date/Time Value Ref Range Status  11/27/2014 07:11 AM 163.5* 0.0 - 100.0 pg/mL Final   No results for input(s): HGBA1C in the last 72 hours. Fasting Lipid Panel:  Recent Labs  11/28/14 0405  CHOL 103  HDL 26*  LDLCALC 59  TRIG 92  CHOLHDL 4.0   Thyroid Function Tests:  Recent Labs  11/28/14 0405  T3FREE 1.8*   TELE: SR, no sig ectopy       ECHO: 11/28/2014   Radiology/Studies: No results found.   Current Medications:  . atorvastatin  80 mg Oral q1800  . azithromycin  500 mg Oral Daily  . budesonide-formoterol  2 puff Inhalation BID  . cefTRIAXone (ROCEPHIN)  IV  1 g Intravenous Q24H  . cholecalciferol  2,000 Units Oral BID  . clopidogrel  75 mg Oral Daily  . enoxaparin (LOVENOX) injection  150 mg Subcutaneous Q12H  . ferrous fumarate  1 tablet Oral Daily  . insulin aspart  0-15 Units Subcutaneous 4 times per day  . ipratropium-albuterol  3 mL Nebulization TID  . metoprolol tartrate  12.5 mg Oral BID  . mirabegron ER  25 mg Oral Daily  . pantoprazole  40 mg Oral Daily  . Warfarin - Pharmacist Dosing Inpatient   Does not apply q1800      ASSESSMENT AND PLAN: 1. NSTEMI  - Troponin values have been 2.12, 1.85, and 1.71 here, higher than at [facility] - has experienced episodes of dyspnea. Denies any recent or current chest pain. -  Treating with aggressive medical mgt.  Plavix has been initiated. Nitrates not needed. Stress test would be of low sensitivity given body habitus. Cath at this point risks outweigh benefits. EF reassuring. Understands overall increased CV risk.  -  With Coumadin anticoagulation, no ASA at this time to avoid triple therapy.  - Echo results reassuring, EF 50-55% - Continue statin and low-dose BB. - Comfortable with DC from cardiac perspective. Will get follow up in [place] area.  - INR 1.87, see pharm notes, close to goal. Stop Lovenox when at goal.   2. Acute on chronic diastolic heart failure  - pulmonary edema noted on CXR. Given  IV Lasix x 1 10/30 - BNP 163.5 on 11/27/2014.   3. Bilateral pneumonia  - continue Abx treatment - per primary team  4. Sleep apnea  - not on CPAP due to being associated with asthma exacerbations according to the patient. - per IM  5. Recurrent PE in 2004, 2006 - lovenox >>warfarin  - per IM  6. Right Ankle Fx - s/p closed reduction and external fixation on 11/27/2014. - per Ortho/IM  Otherwise, per IM, Ortho. Principal Problem:   Ankle fracture, right Active Problems:   Dislocation of right ankle joint   Bilateral pneumonia   Acute on chronic diastolic CHF (congestive heart failure) (HCC)   NSTEMI (non-ST elevated myocardial infarction) (HCC)   Obesity   Sleep apnea   COPD (chronic obstructive pulmonary disease) (HCC)   Chronic venous insufficiency   Recurrent pulmonary embolism (HCC)   Chronic anticoagulation   Diabetes mellitus (HCC)  Will sign off and set up appt.  Please call if any questions.    Lonna Duval, MARK MD 10:45 AM 11/30/2014

## 2014-11-30 NOTE — Discharge Summary (Addendum)
Physician Discharge Summary  [name] ZOX:[id number] DOB: 1945-05-02 DOA: 11/26/2014  PCP: No primary care provider on file.  Admit date: 11/26/2014 Discharge date: 11/30/2014  Recommendations for Outpatient Follow-up:  1. To SNF for ongoing PT/OT.  nonweight bearing RLE 2. F/u with Dr. Carola Frost in 2 weeks for reevaluation of ankle 3. Daily dressing changes or more frequently if needed for drainage.  (cleanse pin sites with soap and water, warp pins with kerlix, cover with ACE bandage) 4. Continue coumadin, INR subtherapeutic and should be rechecked on Thursday 5. Continue to bridge with therapeutic lovenox until her INR has been therapeutic for approximately 24 hours, then stop her lovenox 6. F/u with Cardiology in 1 week  Discharge Diagnoses:  Principal Problem:   Ankle fracture, right Active Problems:   Dislocation of right ankle joint   Bilateral pneumonia   Acute on chronic diastolic CHF (congestive heart failure) (HCC)   NSTEMI (non-ST elevated myocardial infarction) (HCC)   Obesity   Sleep apnea   COPD (chronic obstructive pulmonary disease) (HCC)   Chronic venous insufficiency   Recurrent pulmonary embolism (HCC)   Chronic anticoagulation   Diabetes mellitus (HCC)   Discharge Condition: stable, improved  Diet recommendation: Diabetic/healthy heart  Wt Readings from Last 3 Encounters:  11/29/14 155.856 kg (343 lb 9.6 oz)    History of present illness:  The patient is a 69 year old female with history of obesity, hypertension, obstructive sleep apnea, hyperlipidemia, chronic venous insufficiency, chronic diastolic dysfunction, recurrent pulmonary embolism on chronic anticoagulation with Coumadin. She presented with right ankle pain after a mechanical fall.  She initially presented to [facility] on 11/25/2014 with generalized weakness, cough and shortness of breath and was found to have bilateral pneumonia, probable venous congestion with cellulitis.  She was  started on vancomycin and was reevaluated on 10/29 at which time she was found to have a right ankle fracture. She was transferred to [facility] for orthopedic surgery evaluation.  Hospital Course:   Right ankle fracture status post reduction and external fixation on 11/27/2014. She is nonweightbearing of the right lower extremity. She will be transferred to a skilled nursing facility for ongoing physical and occupational therapy. See above for recommended dressing changes. She is on chronic full dose anticoagulation secondary to recurrent pulmonary embolism, see below.  Bilateral pneumonia, initially started on vancomycin at the outside hospital but eventually transitioned to ceftriaxone and azithromycin for concern for community-acquired pneumonia. Sepsis ruled out.  Last day of antibiotics on 12/02/2014, then stop.  Recommend repeat chest x-ray in approximately 3-4 weeks.  Acute on chronic diastolic heart failure, with grade 2 diastolic dysfunction demonstrated on echocardiogram. She had evidence of vascular congestion and bilateral infiltrates versus edema on her initial chest x-ray. Her BNP was 163.5. She was given Lasix 40 mg IV and had some diuresis. She is overall -2.3 L during his hospitalization and her weight the day prior to discharge was 156 kg.  NSTEMI. Cardiology was consulted. Her peak troponin was 2.12 and she was having dyspnea without chest pain. Telemetry demonstrated normal sinus rhythm. She was started on Plavix and statin and low-dose beta blocker. She did not require nitrates. Stress test was not indicated secondary to her body habitus. Her echocardiogram demonstrated preserved ejection fraction of 50-55% without regional wall motion abnormalities. Cardiology recommended continuing Lovenox until her INR is therapeutic, continuing Plavix.  They recommended against aspirin to avoid triple therapy which increases the risk of bleeding without decreasing mortality. She should  continue beta  blocker and statin.  Obstructive sleep apnea, not on CPAP secondary to recent attributing asthma exacerbations to using her machine.  COPD, stable. Continue symbicort and prn albuterol.  Recurrent pulmonary embolism in 2004 in 2006. She is on chronic anticoagulation with Coumadin. Her INR at the time of discharge was 1.8. She should continue Lovenox until her INR is therapeutic for at least 24 hours.  Diabetes mellitus type 2 with hemoglobin A1c of 7.3.  Continue home medications.     Consultants:  Orthopedics, Dr. Carola Frost  Cardiology, Dr. Anne Fu  Procedures:   Ex fix on 11/27/2014 by Dr. Carola Frost  Antibiotics:  Vanc, zosyn stopped 10/31  Rocephin azithro 10/31 -  Discharge Exam: Filed Vitals:   11/30/14 0530  BP: 121/57  Pulse:   Temp: 98.1 F (36.7 C)  Resp: 20   Filed Vitals:   11/29/14 1410 11/29/14 2005 11/30/14 0530 11/30/14 0843  BP:  142/70 121/57   Pulse:  73    Temp:  98.8 F (37.1 C) 98.1 F (36.7 C)   TempSrc:  Oral Oral   Resp:  20 20   Height:      Weight:      SpO2: 94% 93% 95% 98%    General: Obese female, no acute distress Cardiovascular: 2/6 systolic murmur at the right upper sternal border, regular rate and rhythm Respiratory: Clear to auscultation bilaterally, no increased work of breathing Abdomen: NABS, soft, nondistended, nontender MSK: Right lower extremity with 1+ pitting edema and some bruising visible on her toes. Her dressings were not removed for examination because they were changed overnight, however  there does not appear to be any purulent discharge or erythema around the pin sites which were peaked at 3 dressings.   Discharge Instructions      Discharge Instructions    (HEART FAILURE PATIENTS) Call MD:  Anytime you have any of the following symptoms: 1) 3 pound weight gain in 24 hours or 5 pounds in 1 week 2) shortness of breath, with or without a dry hacking cough 3) swelling in the hands, feet or stomach 4) if  you have to sleep on extra pillows at night in order to breathe.    Complete by:  As directed      Call MD for:  difficulty breathing, headache or visual disturbances    Complete by:  As directed      Call MD for:  extreme fatigue    Complete by:  As directed      Call MD for:  hives    Complete by:  As directed      Call MD for:  persistant dizziness or light-headedness    Complete by:  As directed      Call MD for:  persistant nausea and vomiting    Complete by:  As directed      Call MD for:  redness, tenderness, or signs of infection (pain, swelling, redness, odor or green/yellow discharge around incision site)    Complete by:  As directed      Call MD for:  severe uncontrolled pain    Complete by:  As directed      Call MD for:  temperature >100.4    Complete by:  As directed      Diet - low sodium heart healthy    Complete by:  As directed      Diet Carb Modified    Complete by:  As directed      Increase activity slowly  Complete by:  As directed             Medication List    STOP taking these medications        budesonide 0.25 MG/2ML nebulizer solution  Commonly known as:  PULMICORT     CALCIUM + D PO     simvastatin 10 MG tablet  Commonly known as:  ZOCOR      TAKE these medications        albuterol 108 (90 BASE) MCG/ACT inhaler  Commonly known as:  PROVENTIL HFA;VENTOLIN HFA  Inhale into the lungs every 6 (six) hours as needed for wheezing or shortness of breath.     albuterol (2.5 MG/3ML) 0.083% nebulizer solution  Commonly known as:  PROVENTIL  Inhale 3 mLs into the lungs every 6 (six) hours as needed for wheezing or shortness of breath.     atorvastatin 80 MG tablet  Commonly known as:  LIPITOR  Take 1 tablet (80 mg total) by mouth daily at 6 PM.     azithromycin 500 MG tablet  Commonly known as:  ZITHROMAX  Take 1 tablet (500 mg total) by mouth daily.     budesonide-formoterol 160-4.5 MCG/ACT inhaler  Commonly known as:  SYMBICORT  Inhale  2 puffs into the lungs 2 (two) times daily.     Cholecalciferol 2000 UNITS Tabs  Take 1 tablet (2,000 Units total) by mouth 2 (two) times daily.     clopidogrel 75 MG tablet  Commonly known as:  PLAVIX  Take 1 tablet (75 mg total) by mouth daily.     enoxaparin 150 MG/ML injection  Commonly known as:  LOVENOX  Inject 1 mL (150 mg total) into the skin every 12 (twelve) hours.     esomeprazole 40 MG capsule  Commonly known as:  NEXIUM  Take 40 mg by mouth daily at 12 noon.     ferrous fumarate 325 (106 FE) MG Tabs tablet  Commonly known as:  HEMOCYTE - 106 mg FE  Take 1 tablet by mouth.     JANUVIA 100 MG tablet  Generic drug:  sitaGLIPtin  Take 100 mg by mouth daily.     LORazepam 0.5 MG tablet  Commonly known as:  ATIVAN  Take 1 tablet (0.5 mg total) by mouth at bedtime.     metFORMIN 500 MG tablet  Commonly known as:  GLUCOPHAGE  Take 500-1,000 mg by mouth 2 (two) times daily. Takes 1000mg  in am and 500mg  in pm     metoprolol tartrate 25 MG tablet  Commonly known as:  LOPRESSOR  Take 0.5 tablets (12.5 mg total) by mouth 2 (two) times daily.     MYRBETRIQ 50 MG Tb24 tablet  Generic drug:  mirabegron ER  Take 50 mg by mouth daily.     nitroGLYCERIN 0.4 MG SL tablet  Commonly known as:  NITROSTAT  Place 1 tablet (0.4 mg total) under the tongue every 5 (five) minutes as needed for chest pain.     nystatin 100000 UNIT/GM Powd  Apply 1 g topically daily as needed (FOR RASH).     oxyCODONE-acetaminophen 10-325 MG tablet  Commonly known as:  PERCOCET  Take 1 tablet by mouth every 6 (six) hours as needed for pain.     polyethylene glycol powder powder  Commonly known as:  GLYCOLAX/MIRALAX  Take 17 g by mouth daily.     ranitidine 300 MG tablet  Commonly known as:  ZANTAC  Take 300 mg by mouth at bedtime.  warfarin 2 MG tablet  Commonly known as:  COUMADIN  Take 4-6 mg by mouth every evening. 2 tablets daily except 3 tablets Mondays and Friday. Last dose 10/27        Follow-up Information    Follow up with HANDY,MICHAEL H, MD. Schedule an appointment as soon as possible for a visit in 2 weeks.   Specialty:  Orthopedic Surgery   Why:  For wound re-check   Contact information:   [address] [place] [address] [phone]       Follow up with [facility]. Schedule an appointment as soon as possible for a visit in 1 week.   Contact information:   [address] [place] [address] [phone]       The results of significant diagnostics from this hospitalization (including imaging, microbiology, ancillary and laboratory) are listed below for reference.    Significant Diagnostic Studies: Dg Ankle Complete Right  11/27/2014  CLINICAL DATA:  External fixation of right ankle fracture. EXAM: RIGHT ANKLE - COMPLETE 3+ VIEW; DG C-ARM 61-120 MIN COMPARISON:  Right ankle radiographs performed 11/26/2014 FINDINGS: A single fluoroscopic C-arm image is provided from the OR. This demonstrates interval reduction of the previously noted right ankle dislocation, with mild residual displacement of distal fibular and medial malleolar fractures. A pin is seen overlying the ankle mortise. Surrounding soft tissue swelling is noted. IMPRESSION: Interval reduction of right ankle dislocation, with associated pain overlying the ankle mortise. Electronically Signed   By: Roanna Raider M.D.   On: 11/27/2014 05:13   Ct Ankle Right Wo Contrast  11/27/2014  CLINICAL DATA:  Follow up right ankle fracture post external fixation. EXAM: CT OF THE RIGHT ANKLE WITHOUT CONTRAST TECHNIQUE: Multidetector CT imaging of the right ankle was performed according to the standard protocol. Multiplanar CT image reconstructions were also generated. COMPARISON:  Radiographs 11/27/2014. FINDINGS: External fixators traverse the mid tibial diaphysis and the calcaneal tuberosity. There is a rod  traversing the subtalar and tibiotalar joints. The tibiotalar dislocation has been reduced. There is a comminuted intra-articular fracture of the distal tibia. There is involvement of the articular surface posteriorly, situated in the coronal plane. This fracture demonstrates up to 4 mm displacement medially and up to 2 mm of impaction of the articular surface posteriorly. Fracture extends into the base of the medial malleolus. Laterally, the fracture extends into the distal tibiofibular articulation. There are small intra-articular fracture fragments. No significant widening of the ankle mortise is demonstrated. There is a comminuted fracture of the distal tibial diaphysis with a 4.6 cm butterfly fragment anteriorly. This fracture is only mildly displaced. The talar dome appears intact. No acute fractures are identified within the hindfoot. There is a possible nondisplaced fracture involving the base of the second metatarsal. Mild midfoot degenerative changes are present. There is no significant ankle joint effusion. Mild subcutaneous edema is present within lower leg and hindfoot. There are extensive vascular calcifications. IMPRESSION: 1. Improved alignment of comminuted intra-articular fracture of the distal tibia status post fixation. There is mild displacement and depression of the articular surface of the tibial plafond posteriorly. 2. Comminuted fracture of the distal fibular diaphysis demonstrates minimal displacement. 3. Possible nondisplaced fracture of the second metatarsal base. Electronically Signed   By: Carey Bullocks M.D.   On: 11/27/2014 12:18   Dg Chest Port 1 View  11/27/2014  CLINICAL DATA:  Acute onset of shortness of breath, cough and congestion. Initial encounter.  EXAM: PORTABLE CHEST 1 VIEW COMPARISON:  Chest radiograph performed 11/24/2014 FINDINGS: Previously noted pulmonary edema has improved. Residual mildly increased interstitial markings are noted. Vascular congestion is seen. No  pleural effusion or pneumothorax is identified. The cardiomediastinal silhouette is borderline normal in size. No acute osseous abnormalities are identified. IMPRESSION: Interval improvement in pulmonary edema, though residual mildly increased interstitial markings are noted, with underlying vascular congestion. Electronically Signed   By: Roanna RaiderJeffery  Chang M.D.   On: 11/27/2014 06:58   Dg Knee Left Port  11/28/2014  CLINICAL DATA:  Fall. EXAM: PORTABLE LEFT KNEE - 1-2 VIEW COMPARISON:  None. FINDINGS: Exam detail is diminished secondary to body habitus and suboptimal patient positioning. A left knee arthroplasty device is identified. There is no fracture or subluxation identified. IMPRESSION: 1. Diminished exam detail secondary to large body habitus. 2. No acute findings noted. Electronically Signed   By: Signa Kellaylor  Stroud M.D.   On: 11/28/2014 10:28   Dg Knee Right Port  11/28/2014  CLINICAL DATA:  Large body habitus. Fall. History of right knee arthroplasty. EXAM: PORTABLE RIGHT KNEE - 1-2 VIEW COMPARISON:  None. FINDINGS: Previous right total knee arthroplasty. No evidence for joint effusion. No periprosthetic fracture or dislocation. IMPRESSION: 1. No acute findings noted. Electronically Signed   By: Signa Kellaylor  Stroud M.D.   On: 11/28/2014 10:18   Dg Ankle Right Port  11/27/2014  CLINICAL DATA:  Postop external fixation of RIGHT ankle fracture EXAM: PORTABLE RIGHT ANKLE - 2 VIEW COMPARISON:  11/26/2014 FINDINGS: External fixator device applied with large K-wire extending through the talus into the tibia. There is interval reduction of the fracture dislocation. Trimalleolar fracture noted. IMPRESSION: 1. Interval reduction of the fracture dislocation with external fixator. 2. Trimalleolar fracture Electronically Signed   By: Genevive BiStewart  Edmunds M.D.   On: 11/27/2014 07:17   Dg Humerus Right  11/28/2014  CLINICAL DATA:  Fall.  Difficulty raising arm. EXAM: RIGHT HUMERUS - 2+ VIEW COMPARISON:  None. FINDINGS:  There is no evidence of fracture or other focal bone lesions. Soft tissues are unremarkable. IMPRESSION: Negative. Electronically Signed   By: Signa Kellaylor  Stroud M.D.   On: 11/28/2014 10:09   Dg C-arm 1-60 Min  11/27/2014  CLINICAL DATA:  External fixation of right ankle fracture. EXAM: RIGHT ANKLE - COMPLETE 3+ VIEW; DG C-ARM 61-120 MIN COMPARISON:  Right ankle radiographs performed 11/26/2014 FINDINGS: A single fluoroscopic C-arm image is provided from the OR. This demonstrates interval reduction of the previously noted right ankle dislocation, with mild residual displacement of distal fibular and medial malleolar fractures. A pin is seen overlying the ankle mortise. Surrounding soft tissue swelling is noted. IMPRESSION: Interval reduction of right ankle dislocation, with associated pain overlying the ankle mortise. Electronically Signed   By: Roanna RaiderJeffery  Chang M.D.   On: 11/27/2014 05:13    Microbiology: Recent Results (from the past 240 hour(s))  Surgical pcr screen     Status: None   Collection Time: 11/27/14 12:05 AM  Result Value Ref Range Status   MRSA, PCR NEGATIVE NEGATIVE Final   Staphylococcus aureus NEGATIVE NEGATIVE Final    Comment:        The Xpert SA Assay (FDA approved for NASAL specimens in patients over 69 years of age), is one component of a comprehensive surveillance program.  Test performance has been validated by [facility] for patients greater than or equal to 69 year old. It is not intended to diagnose infection nor to guide or monitor treatment.   Culture, blood (  routine x 2)     Status: None (Preliminary result)   Collection Time: 11/27/14  4:15 AM  Result Value Ref Range Status   Specimen Description BLOOD LEFT ANTECUBITAL  Final   Special Requests BOTTLES DRAWN AEROBIC AND ANAEROBIC 5CC   Final   Culture NO GROWTH 2 DAYS  Final   Report Status PENDING  Incomplete  Culture, blood (routine x 2)     Status: None (Preliminary result)   Collection Time: 11/27/14   4:24 AM  Result Value Ref Range Status   Specimen Description BLOOD LEFT ARM  Final   Special Requests BOTTLES DRAWN AEROBIC AND ANAEROBIC 5CC   Final   Culture NO GROWTH 2 DAYS  Final   Report Status PENDING  Incomplete  Gram stain     Status: None   Collection Time: 11/27/14 10:45 AM  Result Value Ref Range Status   Specimen Description URINE, RANDOM  Final   Special Requests NONE  Final   Gram Stain   Final    WBC PRESENT,BOTH PMN AND MONONUCLEAR BUDDING YEAST SEEN CYTOSPIN SMEAR    Report Status 11/27/2014 FINAL  Final  Culture, sputum-assessment     Status: None   Collection Time: 11/29/14  2:02 AM  Result Value Ref Range Status   Specimen Description SPUTUM  Final   Special Requests NONE  Final   Sputum evaluation   Final    THIS SPECIMEN IS ACCEPTABLE. RESPIRATORY CULTURE REPORT TO FOLLOW.   Report Status 11/29/2014 FINAL  Final  Culture, respiratory (NON-Expectorated)     Status: None (Preliminary result)   Collection Time: 11/29/14  2:02 AM  Result Value Ref Range Status   Specimen Description SPUTUM  Final   Special Requests NONE  Final   Gram Stain PENDING  Incomplete   Culture   Final    MODERATE CANDIDA ALBICANS Performed at [facility]    Report Status PENDING  Incomplete     Labs: Basic Metabolic Panel:  Recent Labs Lab 11/27/14 0445 11/28/14 0405 11/30/14 0612  NA 133* 140 134*  K 5.3* 3.9 4.0  CL 96* 98* 95*  CO2 27 34* 32  GLUCOSE 155* 172* 129*  BUN CREATININE 0.86 0.92 0.73  CALCIUM 8.4*  8.6* 8.7* 9.2  MG 1.7  --   --   PHOS 3.8  --   --    Liver Function Tests:  Recent Labs Lab 11/27/14 0445 11/28/14 0405  AST 56* 32  ALT 29 19  ALKPHOS 64 58  BILITOT 1.6* 1.0  PROT 5.5* 5.9*  ALBUMIN 2.6* 2.6*   No results for input(s): LIPASE, AMYLASE in the last 168 hours. No results for input(s): AMMONIA in the last 168 hours. CBC:  Recent Labs Lab 11/27/14 0445 11/28/14 0405  WBC 14.8* 12.4*  NEUTROABS 12.4*   --   HGB 12.2 12.0  HCT 40.4 40.5  MCV 91.0 91.8  PLT 214 235   Cardiac Enzymes:  Recent Labs Lab 11/27/14 0443 11/27/14 1120 11/27/14 1550  TROPONINI 2.12* 1.85* 1.71*   BNP: BNP (last 3 results)  Recent Labs  11/27/14 0711  BNP 163.5*    ProBNP (last 3 results) No results for input(s): PROBNP in the last 8760 hours.  CBG:  Recent Labs Lab 11/29/14 1140 11/29/14 1754 11/30/14 0020 11/30/14 0529 11/30/14 1125  GLUCAP 154* 191* 128* 122* 149*    Time coordinating discharge: 35 minutes  Signed:  Izael Bessinger  [facility] 11/30/2014, 12:24 PM

## 2014-11-30 NOTE — Progress Notes (Signed)
Occupational Therapy Treatment Patient Details Name: [name] MRN: [id number] DOB: Sep 29, 1945 Today's Date: 11/30/2014    History of present illness [name] is a 69 y.o. female with Past medical history of obesity, hypertension, sleep apnea, dyslipidemia, chronic venous insufficiency, diastolic dysfunction, recurrent pulmonary embolism on chronic anticoagulation. Who fell at home and 2 days later presented with ankle pain with fx. S/p ex fix placement 10/30   OT comments  Seen for splint check. Tolerating well. Splint repositioned appropriately and marked to improve carry over with nsg staff. Began RTC strengthening ex R shoulder. Pt with no active FF or abd; 3+/5 ER. Discussed POC with PT. XL sling ordered for maximove. Pt wants to get OOB to [facility]. Bariatric BSC ordered from portable equipment. Will continue to follow acutely.  Follow Up Recommendations  SNF;Supervision/Assistance - 24 hour    Equipment Recommendations  Other (comment) (TBA at next venue)    Recommendations for Other Services      Precautions / Restrictions Precautions Precautions: Fall Precaution Comments: at risk for skin breakdown; No R shoulder movement (new) Restrictions Weight Bearing Restrictions: Yes RLE Weight Bearing: Non weight bearing              ADL Overall ADL's : Needs assistance/impaired                                       General ADL Comments: seen for splint check. Pt tolerating without problems noted. Information written on splint to improve communication with nsg/SNF caregivers.       Vision                     Perception     Praxis      Cognition   Behavior During Therapy: [place] for tasks assessed/performed Overall Cognitive Status: Within Functional Limits for tasks assessed                       Extremity/Trunk Assessment     No active R shoulder FF/ abd. 3+5 ER. Pt reports pain today in R shoulder - lateral aspect. Small amount  of bruising noted. R UE supported on pillow and ice pack applied          Exercises General Exercises - Upper Extremity Shoulder Flexion: Right;PROM;15 reps;Supine Shoulder ABduction: PROM;15 reps;Supine Other Exercises Other Exercises: R shoulder ER x 15; AROM; began isometrics   Shoulder Instructions       General Comments      Pertinent Vitals/ Pain       Pain Assessment: 0-10 Pain Score: 3  Pain Location: R ankle Pain Descriptors / Indicators: Aching;Burning Pain Intervention(s): Limited activity within patient's tolerance;Repositioned;Ice applied  Home Living                                          Prior Functioning/Environment              Frequency Min 3X/week     Progress Toward Goals  OT Goals(current goals can now be found in the care plan section)  Progress towards OT goals: Progressing toward goals  Acute Rehab OT Goals Patient Stated Goal: to get better OT Goal Formulation: With patient Time For Goal Achievement: 12/12/14 Potential to Achieve Goals: Fair ADL Goals Pt Will  Perform Grooming: with supervision;sitting Pt Will Perform Lower Body Bathing: sit to/from stand;with mod assist Pt Will Perform Lower Body Dressing: sit to/from stand;with mod assist Pt Will Transfer to Toilet: with mod assist;stand pivot transfer;bedside commode Pt Will Perform Toileting - Clothing Manipulation and hygiene: with mod assist;sit to/from stand Additional ADL Goal #1: Pt will tolerate R footplate splint without problems to maintain foot at neutral position. Additional ADL Goal #2: staff will be independent with donning/doffing R footplate Additional ADL Goal #3: Staff will be independent with mobilizing pt using maximove daily Additional ADL Goal #4: Pt will complete RUE A/AA/PROM to increase ROM and strength with min A  Plan Discharge plan remains appropriate    Co-evaluation                 End of Session     Activity Tolerance  Patient tolerated treatment well   Patient Left in bed;with call bell/phone within reach   Nurse Communication          Time: 1112-1140 OT Time Calculation (min): 28 min  Charges: OT General Charges $OT Visit: 1 Procedure OT Treatments $Therapeutic Exercise: 8-22 mins $Orthotics Fit/Training: 8-22 mins  Jerzie Bieri,HILLARY 11/30/2014, 11:48 AM   [name], OTR/L  [phone] 11/30/2014

## 2014-11-30 NOTE — Progress Notes (Signed)
ANTICOAGULATION CONSULT NOTE - Follow Up Consult  Pharmacy Consult for LMWH and Coumadin Indication: hx PE x 2   Patient Measurements: Height: 5\' 2"  (157.5 cm) Weight: (!) 343 lb 9.6 oz (155.856 kg) IBW/kg (Calculated) : 50.1   Vital Signs: Temp: 98.1 F (36.7 C) (11/02 0530) Temp Source: Oral (11/02 0530) BP: 121/57 mmHg (11/02 0530)  Labs:  Recent Labs  11/27/14 1120 11/27/14 1550 11/28/14 0405 11/29/14 0541 11/30/14 0612  HGB  --   --  12.0  --   --   HCT  --   --  40.5  --   --   PLT  --   --  235  --   --   LABPROT  --   --   --  20.4* 21.5*  INR  --   --   --  1.75* 1.87*  CREATININE  --   --  0.92  --  0.73  TROPONINI 1.85* 1.71*  --   --   --      Assessment:  69 yo F on warfarin PTA for history of PE. Pharmacy has been consulted to dose enoxaparin and coumadin with INR below goal. She is also noted with NSTEMI now on plavix (avoiding triple therapy due to bleeding risk) -INR= 1.87  PTA Warfarin Dose: 6mg  Mon/Fri and 4mg  AODs  Goal of Therapy:  INR 2-3 Monitor platelets by anticoagulation protocol: Yes   Plan:  Warfarin 6mg  tonight x1 Lovenox 150mg  subcutaneously q12h Daily INR/CBC  Arlean Hoppingorey M. Newman PiesBall, PharmD Clinical Pharmacist Pager [phone] 11/30/2014 8:17 AM

## 2014-11-30 NOTE — Clinical Social Work Note (Signed)
[facility]  Patient and family agreeable to plans will transport via ems RN to call report.  Windell MouldingEric Elza Sortor, MSW, Theresia MajorsLCSWA [phone]

## 2014-12-01 LAB — CULTURE, RESPIRATORY

## 2014-12-01 LAB — VITAMIN D 1,25 DIHYDROXY
Vitamin D 1, 25 (OH)2 Total: 64 pg/mL
Vitamin D3 1, 25 (OH)2: 64 pg/mL

## 2014-12-01 LAB — CULTURE, RESPIRATORY W GRAM STAIN

## 2014-12-02 LAB — CULTURE, BLOOD (ROUTINE X 2)
CULTURE: NO GROWTH
CULTURE: NO GROWTH

## 2014-12-15 ENCOUNTER — Ambulatory Visit (INDEPENDENT_AMBULATORY_CARE_PROVIDER_SITE_OTHER): Payer: Medicare Other | Admitting: Cardiology

## 2014-12-15 VITALS — BP 133/79 | HR 72 | Ht 62.0 in

## 2014-12-15 DIAGNOSIS — I5032 Chronic diastolic (congestive) heart failure: Secondary | ICD-10-CM

## 2014-12-15 MED ORDER — FUROSEMIDE 20 MG PO TABS: 20.0000 mg | ORAL_TABLET | Freq: Every day | ORAL | Status: AC

## 2014-12-15 NOTE — Progress Notes (Signed)
Patient ID: [name], female   DOB: 07/22/1945, 69 y.o.   MRN: [id number]     Clinical Summary [name] is a 69 y.o.female seen today as hospital follow up appointment. This is our first visit together. She is seen for the following medical problems.  1. Chronic diastolic HF - admit 11/2014 with ankle fracture, pneumonia, and NSTEMI. Found to be in CHF with acute on chronic diastolic HF - echo 11/2014 LVEF 50-55%, grade II diastolic dysfunction, mild to mod RV dysfunction, PASP 50 - diuresed 2.3 liters, discharge weight 343 lbs.   - denies any LE edema. No SOB or DOE - compliant with meds  2. NSTEMI - trop peak to 2.12 during recent admit. Echo with normal LVEF and no WMAs. Managed medically due to medical comorbidities. Started on plavix, no ASA due to coexisting need for anticoag given history of PEs. Stress testing low sensitivity due to severe obesity.   - denies any chest pain   3. 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 Discharged on lovenox bridge. INRs followed by pcp  Past Medical History  Diagnosis Date  . Ankle fracture, right 11/27/2014  . Dislocation of right ankle joint 11/27/2014     Allergies  Allergen Reactions  . Talwin [Pentazocine] Other (See Comments)    Extreme hot feeling  . Fentanyl Other (See Comments)    Fainted?  . Latex Itching     Current Outpatient Prescriptions  Medication Sig Dispense Refill  . albuterol (PROVENTIL HFA;VENTOLIN HFA) 108 (90 BASE) MCG/ACT inhaler Inhale into the lungs every 6 (six) hours as needed for wheezing or shortness of breath.    Marland Kitchen albuterol (PROVENTIL) (2.5 MG/3ML) 0.083% nebulizer solution Inhale 3 mLs into the lungs every 6 (six) hours as needed for wheezing or shortness of breath.     Marland Kitchen atorvastatin (LIPITOR) 80 MG tablet Take 1 tablet (80 mg total) by mouth daily at 6 PM. 30 tablet 0  . azithromycin (ZITHROMAX) 500 MG tablet Take 1 tablet (500 mg total) by mouth daily. 2 tablet 0  . budesonide-formoterol (SYMBICORT)  160-4.5 MCG/ACT inhaler Inhale 2 puffs into the lungs 2 (two) times daily.    . cholecalciferol 2000 UNITS TABS Take 1 tablet (2,000 Units total) by mouth 2 (two) times daily. 60 tablet 0  . clopidogrel (PLAVIX) 75 MG tablet Take 1 tablet (75 mg total) by mouth daily. 30 tablet 0  . enoxaparin (LOVENOX) 150 MG/ML injection Inject 1 mL (150 mg total) into the skin every 12 (twelve) hours. 4 Syringe 0  . esomeprazole (NEXIUM) 40 MG capsule Take 40 mg by mouth daily at 12 noon.    . ferrous fumarate (HEMOCYTE - 106 MG FE) 325 (106 FE) MG TABS tablet Take 1 tablet by mouth.    Marland Kitchen JANUVIA 100 MG tablet Take 100 mg by mouth daily.    Marland Kitchen LORazepam (ATIVAN) 0.5 MG tablet Take 1 tablet (0.5 mg total) by mouth at bedtime. 30 tablet 0  . metFORMIN (GLUCOPHAGE) 500 MG tablet Take 500-1,000 mg by mouth 2 (two) times daily. Takes  in am and  in pm    . metoprolol tartrate (LOPRESSOR) 25 MG tablet Take 0.5 tablets (12.5 mg total) by mouth 2 (two) times daily. 60 tablet 0  . mirabegron ER (MYRBETRIQ) 50 MG TB24 tablet Take 50 mg by mouth daily.    . nitroGLYCERIN (NITROSTAT) 0.4 MG SL tablet Place 1 tablet (0.4 mg total) under the tongue every 5 (five) minutes as needed for chest pain.  30 tablet 0  . nystatin (MYCOSTATIN/NYSTOP) 100000 UNIT/GM POWD Apply 1 g topically daily as needed (FOR RASH).     Marland Kitchen. oxyCODONE-acetaminophen (PERCOCET) 10-325 MG tablet Take 1 tablet by mouth every 6 (six) hours as needed for pain. 30 tablet 0  . polyethylene glycol powder (GLYCOLAX/MIRALAX) powder Take 17 g by mouth daily. 255 g 0  . ranitidine (ZANTAC) 300 MG tablet Take 300 mg by mouth at bedtime.    Marland Kitchen. warfarin (COUMADIN) 2 MG tablet Take 4-6 mg by mouth every evening. 2 tablets daily except 3 tablets Mondays and Friday. Last dose 10/27     No current facility-administered medications for this visit.     Past Surgical History  Procedure Laterality Date  . External fixation leg Right 11/27/2014    Procedure:  EXTERNAL FIXATION LEG FOR RIGHT ANKLE FRACTURE;  Surgeon: [name] GalasMichael Handy, MD;  [facility];  Service: Orthopedics;  Laterality: Right;     Allergies  Allergen Reactions  . Talwin [Pentazocine] Other (See Comments)    Extreme hot feeling  . Fentanyl Other (See Comments)    Fainted?  . Latex Itching      Denies family history of heart disease   Social History [name] has no tobacco history on file. Ms. [name] CongoMetro has no alcohol history on file.   Review of Systems CONSTITUTIONAL: No weight loss, fever, chills, weakness or fatigue.  HEENT: Eyes: No visual loss, blurred vision, double vision or yellow sclerae.No hearing loss, sneezing, congestion, runny nose or sore throat.  SKIN: No rash or itching.  CARDIOVASCULAR: per hpi RESPIRATORY: No shortness of breath, cough or sputum.  GASTROINTESTINAL: No anorexia, nausea, vomiting or diarrhea. No abdominal pain or blood.  GENITOURINARY: No burning on urination, no polyuria NEUROLOGICAL: No headache, dizziness, syncope, paralysis, ataxia, numbness or tingling in the extremities. No change in bowel or bladder control.  MUSCULOSKELETAL: No muscle, back pain, joint pain or stiffness.  LYMPHATICS: No enlarged nodes. No history of splenectomy.  PSYCHIATRIC: No history of depression or anxiety.  ENDOCRINOLOGIC: No reports of sweating, cold or heat intolerance. No polyuria or polydipsia.  Marland Kitchen.   Physical Examination Filed Vitals:   12/15/14 1342  BP: 133/79  Pulse: 72   Filed Vitals:   12/15/14 1342  Height: 5\' 2"  (1.575 m)    Gen: resting comfortably, no acute distress HEENT: no scleral icterus, pupils equal round and reactive, no palptable cervical adenopathy,  CV: RRR, no m/r/g, no jvd Resp: Clear to auscultation bilaterally GI: abdomen is soft, non-tender, non-distended, normal bowel sounds, no hepatosplenomegaly MSK: extremities are warm, no edema.  Skin: warm, no rash Neuro:  no focal deficits Psych: appropriate  affect   Diagnostic Studies 11/2014 echo Study Conclusions  - Left ventricle: The cavity size was normal. Wall thickness was normal. Systolic function was normal. The estimated ejection fraction was in the range of 50% to 55%. Wall motion was normal; there were no regional wall motion abnormalities. Features are consistent with a pseudonormal left ventricular filling pattern, with concomitant abnormal relaxation and increased filling pressure (grade 2 diastolic dysfunction). - Aortic valve: There was trivial regurgitation. - Right ventricle: The cavity size was mildly dilated. Systolic function was mildly to moderately reduced. - Tricuspid valve: There was mild-moderate regurgitation. - Pulmonary arteries: Systolic pressure was moderately increased. PA peak pressure: 50 mm Hg (S).    Assessment and Plan  1. Chronic diastolic HF - volume status improved  - continue lasix 20mg  daily  2. NSTEMI - admit with  recent NSTEMI, history as described above, continue medical management  3. Hx of PE - continue anticoag  F/u 3 months  Antoine Poche, M.D

## 2014-12-15 NOTE — Patient Instructions (Signed)
Your physician recommends that you schedule a follow-up appointment in: 3 MONTHS WITH DR. BRANCH  Your physician has recommended you make the following change in your medication:   START LASIX 20 MG DAILY  Your physician recommends that you return for lab work in: 2 WEEKS BMP/MG  Thank you for choosing [facility]!!

## 2015-01-20 ENCOUNTER — Inpatient Hospital Stay (HOSPITAL_COMMUNITY)
Admission: EM | Admit: 2015-01-20 | Discharge: 2015-03-01 | DRG: 987 | Disposition: E | Payer: Medicare Other | Attending: Internal Medicine | Admitting: Internal Medicine

## 2015-01-20 ENCOUNTER — Emergency Department (HOSPITAL_COMMUNITY): Payer: Medicare Other

## 2015-01-20 ENCOUNTER — Encounter (HOSPITAL_COMMUNITY): Payer: Self-pay | Admitting: Emergency Medicine

## 2015-01-20 DIAGNOSIS — I4891 Unspecified atrial fibrillation: Secondary | ICD-10-CM | POA: Diagnosis not present

## 2015-01-20 DIAGNOSIS — J441 Chronic obstructive pulmonary disease with (acute) exacerbation: Secondary | ICD-10-CM | POA: Diagnosis not present

## 2015-01-20 DIAGNOSIS — J9602 Acute respiratory failure with hypercapnia: Secondary | ICD-10-CM | POA: Diagnosis present

## 2015-01-20 DIAGNOSIS — Z515 Encounter for palliative care: Secondary | ICD-10-CM | POA: Diagnosis not present

## 2015-01-20 DIAGNOSIS — R0602 Shortness of breath: Secondary | ICD-10-CM

## 2015-01-20 DIAGNOSIS — R197 Diarrhea, unspecified: Secondary | ICD-10-CM | POA: Diagnosis not present

## 2015-01-20 DIAGNOSIS — J9621 Acute and chronic respiratory failure with hypoxia: Secondary | ICD-10-CM | POA: Diagnosis present

## 2015-01-20 DIAGNOSIS — J9601 Acute respiratory failure with hypoxia: Secondary | ICD-10-CM | POA: Insufficient documentation

## 2015-01-20 DIAGNOSIS — Z7902 Long term (current) use of antithrombotics/antiplatelets: Secondary | ICD-10-CM | POA: Diagnosis not present

## 2015-01-20 DIAGNOSIS — I5033 Acute on chronic diastolic (congestive) heart failure: Secondary | ICD-10-CM | POA: Diagnosis present

## 2015-01-20 DIAGNOSIS — G4733 Obstructive sleep apnea (adult) (pediatric): Secondary | ICD-10-CM | POA: Diagnosis present

## 2015-01-20 DIAGNOSIS — R0600 Dyspnea, unspecified: Secondary | ICD-10-CM | POA: Diagnosis present

## 2015-01-20 DIAGNOSIS — J9622 Acute and chronic respiratory failure with hypercapnia: Secondary | ICD-10-CM | POA: Diagnosis present

## 2015-01-20 DIAGNOSIS — I252 Old myocardial infarction: Secondary | ICD-10-CM

## 2015-01-20 DIAGNOSIS — D649 Anemia, unspecified: Secondary | ICD-10-CM | POA: Diagnosis present

## 2015-01-20 DIAGNOSIS — Z7984 Long term (current) use of oral hypoglycemic drugs: Secondary | ICD-10-CM

## 2015-01-20 DIAGNOSIS — Z9981 Dependence on supplemental oxygen: Secondary | ICD-10-CM | POA: Diagnosis not present

## 2015-01-20 DIAGNOSIS — Z7901 Long term (current) use of anticoagulants: Secondary | ICD-10-CM | POA: Diagnosis not present

## 2015-01-20 DIAGNOSIS — E119 Type 2 diabetes mellitus without complications: Secondary | ICD-10-CM | POA: Diagnosis present

## 2015-01-20 DIAGNOSIS — I509 Heart failure, unspecified: Secondary | ICD-10-CM

## 2015-01-20 DIAGNOSIS — S82891A Other fracture of right lower leg, initial encounter for closed fracture: Secondary | ICD-10-CM | POA: Diagnosis not present

## 2015-01-20 DIAGNOSIS — E873 Alkalosis: Secondary | ICD-10-CM | POA: Diagnosis not present

## 2015-01-20 DIAGNOSIS — J449 Chronic obstructive pulmonary disease, unspecified: Secondary | ICD-10-CM | POA: Insufficient documentation

## 2015-01-20 DIAGNOSIS — J9612 Chronic respiratory failure with hypercapnia: Secondary | ICD-10-CM | POA: Diagnosis not present

## 2015-01-20 DIAGNOSIS — L8990 Pressure ulcer of unspecified site, unspecified stage: Secondary | ICD-10-CM | POA: Insufficient documentation

## 2015-01-20 DIAGNOSIS — Z79899 Other long term (current) drug therapy: Secondary | ICD-10-CM

## 2015-01-20 DIAGNOSIS — Z66 Do not resuscitate: Secondary | ICD-10-CM

## 2015-01-20 DIAGNOSIS — R0603 Acute respiratory distress: Secondary | ICD-10-CM | POA: Insufficient documentation

## 2015-01-20 DIAGNOSIS — I5023 Acute on chronic systolic (congestive) heart failure: Secondary | ICD-10-CM | POA: Insufficient documentation

## 2015-01-20 DIAGNOSIS — I272 Other secondary pulmonary hypertension: Secondary | ICD-10-CM | POA: Diagnosis present

## 2015-01-20 DIAGNOSIS — X58XXXD Exposure to other specified factors, subsequent encounter: Secondary | ICD-10-CM | POA: Diagnosis present

## 2015-01-20 DIAGNOSIS — S82851D Displaced trimalleolar fracture of right lower leg, subsequent encounter for closed fracture with routine healing: Secondary | ICD-10-CM

## 2015-01-20 DIAGNOSIS — Z86711 Personal history of pulmonary embolism: Secondary | ICD-10-CM | POA: Diagnosis not present

## 2015-01-20 DIAGNOSIS — I480 Paroxysmal atrial fibrillation: Secondary | ICD-10-CM | POA: Insufficient documentation

## 2015-01-20 DIAGNOSIS — R0902 Hypoxemia: Secondary | ICD-10-CM

## 2015-01-20 DIAGNOSIS — Z6841 Body Mass Index (BMI) 40.0 and over, adult: Secondary | ICD-10-CM

## 2015-01-20 DIAGNOSIS — E662 Morbid (severe) obesity with alveolar hypoventilation: Secondary | ICD-10-CM | POA: Diagnosis present

## 2015-01-20 DIAGNOSIS — I110 Hypertensive heart disease with heart failure: Principal | ICD-10-CM | POA: Diagnosis present

## 2015-01-20 HISTORY — DX: Chronic obstructive pulmonary disease, unspecified: J44.9

## 2015-01-20 HISTORY — DX: Pneumonia, unspecified organism: J18.9

## 2015-01-20 HISTORY — DX: Reserved for inherently not codable concepts without codable children: IMO0001

## 2015-01-20 HISTORY — DX: Obesity, unspecified: E66.9

## 2015-01-20 HISTORY — DX: Type 2 diabetes mellitus without complications: E11.9

## 2015-01-20 HISTORY — DX: Heart failure, unspecified: I50.9

## 2015-01-20 HISTORY — DX: Essential (primary) hypertension: I10

## 2015-01-20 HISTORY — DX: Other pulmonary embolism without acute cor pulmonale: I26.99

## 2015-01-20 HISTORY — DX: Anxiety disorder, unspecified: F41.9

## 2015-01-20 HISTORY — DX: Unspecified dementia, unspecified severity, without behavioral disturbance, psychotic disturbance, mood disturbance, and anxiety: F03.90

## 2015-01-20 LAB — BASIC METABOLIC PANEL
Anion gap: 12 (ref 5–15)
BUN: 19 mg/dL (ref 6–20)
CO2: 40 mmol/L — ABNORMAL HIGH (ref 22–32)
Calcium: 9.5 mg/dL (ref 8.9–10.3)
Chloride: 91 mmol/L — ABNORMAL LOW (ref 101–111)
Creatinine, Ser: 0.62 mg/dL (ref 0.44–1.00)
GFR calc non Af Amer: >60 mL/min (ref >60)
Glucose, Bld: 162 mg/dL — ABNORMAL HIGH (ref 65–99)
POTASSIUM: 4.2 mmol/L (ref 3.5–5.1)
SODIUM: 143 mmol/L (ref 135–145)

## 2015-01-20 LAB — I-STAT ARTERIAL BLOOD GAS, ED
ACID-BASE EXCESS: 21 mmol/L — AB (ref 0.0–2.0)
Acid-Base Excess: 17 mmol/L — ABNORMAL HIGH (ref 0.0–2.0)
BICARBONATE: 50 meq/L — AB (ref 20.0–24.0)
Bicarbonate: 48 mEq/L — ABNORMAL HIGH (ref 20.0–24.0)
O2 SAT: 99 %
O2 Saturation: 84 %
PCO2 ART: 94.9 mmHg — AB (ref 35.0–45.0)
PH ART: 7.312 — AB (ref 7.350–7.450)
PH ART: 7.404 (ref 7.350–7.450)
Patient temperature: 98.6
TCO2: >50 mmol/L (ref 0–100)
pCO2 arterial: 79.6 mmHg (ref 35.0–45.0)
pO2, Arterial: 159 mmHg — ABNORMAL HIGH (ref 80.0–100.0)
pO2, Arterial: 51 mmHg — ABNORMAL LOW (ref 80.0–100.0)

## 2015-01-20 LAB — PROTIME-INR
INR: 2.91 — ABNORMAL HIGH (ref 0.00–1.49)
Prothrombin Time: 29.9 seconds — ABNORMAL HIGH (ref 11.6–15.2)

## 2015-01-20 LAB — CBC WITH DIFFERENTIAL/PLATELET
Basophils Absolute: 0 10*3/uL (ref 0.0–0.1)
Basophils Relative: 0 %
Eosinophils Absolute: 0.2 10*3/uL (ref 0.0–0.7)
Eosinophils Relative: 2 %
HCT: 37 % (ref 36.0–46.0)
HEMOGLOBIN: 10.2 g/dL — AB (ref 12.0–15.0)
LYMPHS ABS: 0.8 10*3/uL (ref 0.7–4.0)
LYMPHS PCT: 6 %
MCH: 26.4 pg (ref 26.0–34.0)
MCHC: 27.6 g/dL — ABNORMAL LOW (ref 30.0–36.0)
MCV: 95.9 fL (ref 78.0–100.0)
Monocytes Absolute: 0.8 10*3/uL (ref 0.1–1.0)
Monocytes Relative: 6 %
NEUTROS ABS: 11.3 10*3/uL — AB (ref 1.7–7.7)
NEUTROS PCT: 86 %
Platelets: 354 10*3/uL (ref 150–400)
RBC: 3.86 MIL/uL — AB (ref 3.87–5.11)
RDW: 17.1 % — ABNORMAL HIGH (ref 11.5–15.5)
WBC: 13.1 10*3/uL — AB (ref 4.0–10.5)

## 2015-01-20 LAB — I-STAT TROPONIN, ED: Troponin i, poc: 0 ng/mL (ref 0.00–0.08)

## 2015-01-20 LAB — BRAIN NATRIURETIC PEPTIDE: B Natriuretic Peptide: 178.8 pg/mL — ABNORMAL HIGH (ref 0.0–100.0)

## 2015-01-20 MED ORDER — SODIUM CHLORIDE 0.9 % IJ SOLN
3.0000 mL | Freq: Two times a day (BID) | INTRAMUSCULAR | Status: DC
  Administered 2015-01-21 – 2015-01-28 (×13): 3 mL via INTRAVENOUS

## 2015-01-20 MED ORDER — HYDROMORPHONE HCL 1 MG/ML IJ SOLN: 0.5000 mg | INTRAMUSCULAR | Status: DC | PRN

## 2015-01-20 MED ORDER — SODIUM CHLORIDE 0.9 % IV SOLN: 250.0000 mL | INTRAVENOUS | Status: DC | PRN

## 2015-01-20 MED ORDER — ACETAMINOPHEN 650 MG RE SUPP
650.0000 mg | Freq: Four times a day (QID) | RECTAL | Status: DC | PRN
  Administered 2015-02-02: 650 mg via RECTAL
  Filled 2015-01-20: qty 1

## 2015-01-20 MED ORDER — ONDANSETRON HCL 4 MG PO TABS: 4.0000 mg | ORAL_TABLET | Freq: Four times a day (QID) | ORAL | Status: DC | PRN

## 2015-01-20 MED ORDER — ONDANSETRON HCL 4 MG/2ML IJ SOLN: 4.0000 mg | Freq: Four times a day (QID) | INTRAMUSCULAR | Status: DC | PRN

## 2015-01-20 MED ORDER — INSULIN ASPART 100 UNIT/ML ~~LOC~~ SOLN
0.0000 [IU] | SUBCUTANEOUS | Status: DC
  Administered 2015-01-20 – 2015-01-21 (×5): 2 [IU] via SUBCUTANEOUS
  Administered 2015-01-21: 1 [IU] via SUBCUTANEOUS
  Administered 2015-01-21: 2 [IU] via SUBCUTANEOUS
  Administered 2015-01-22: 1 [IU] via SUBCUTANEOUS
  Administered 2015-01-22 (×2): 2 [IU] via SUBCUTANEOUS

## 2015-01-20 MED ORDER — ACETAMINOPHEN 325 MG PO TABS
650.0000 mg | ORAL_TABLET | Freq: Four times a day (QID) | ORAL | Status: DC | PRN
  Administered 2015-01-22 – 2015-01-24 (×3): 650 mg via ORAL
  Filled 2015-01-20 (×4): qty 2

## 2015-01-20 MED ORDER — IPRATROPIUM-ALBUTEROL 0.5-2.5 (3) MG/3ML IN SOLN
3.0000 mL | RESPIRATORY_TRACT | Status: DC
  Administered 2015-01-21 (×5): 3 mL via RESPIRATORY_TRACT
  Filled 2015-01-20 (×5): qty 3

## 2015-01-20 MED ORDER — SODIUM CHLORIDE 0.9 % IJ SOLN: 3.0000 mL | INTRAMUSCULAR | Status: DC | PRN

## 2015-01-20 NOTE — H&P (Signed)
[facility] Admission History and Physical       [name] ZOX:[id number] DOB: November 21, 1945 DOA: 01/22/2015  Referring physician:  PCP: No primary care provider on file.  Specialists:   Chief Complaint: SOB  HPI: [name] is a 69 y.o. female with a history of COPD, Diastolic CHF, HTN, and DM2, and ORIF of right Ankle Fx who was sent from the [facility] to the Ed due to worsening SOB.   Her O2 sats at the facility were found to the in the 60's.  She was administered Lasix at the facility, and placed on 100% O2.  In the ED,  she was evaluated and placed on BIPAP, after and ABD revealed a ph of 7.31 with a pCO2 of 94.6.   She was referred for admission.      Review of Systems:  Constitutional: No Weight Loss, No Weight Gain, Night Sweats, Fevers, Chills, Dizziness, Light Headedness, Fatigue, or Generalized Weakness HEENT: No Headaches, Difficulty Swallowing,Tooth/Dental Problems,Sore Throat,  No Sneezing, Rhinitis, Ear Ache, Nasal Congestion, or Post Nasal Drip,  Cardio-vascular:  No Chest pain, Orthopnea, PND, Edema in Lower Extremities, Anasarca, Dizziness, Palpitations  Resp:  +Dyspnea, No DOE, No Productive Cough, No Non-Productive Cough, No Hemoptysis, No Wheezing.    GI: No Heartburn, Indigestion, Abdominal Pain, Nausea, Vomiting, Diarrhea, Constipation, Hematemesis, Hematochezia, Melena, Change in Bowel Habits,  Loss of Appetite  GU: No Dysuria, No Change in Color of Urine, No Urgency or Urinary Frequency, No Flank pain.  Musculoskeletal: No Joint Pain or Swelling, No Decreased Range of Motion, No Back Pain.  Neurologic: No Syncope, No Seizures, Muscle Weakness, Paresthesia, Vision Disturbance or Loss, No Diplopia, No Vertigo, No Difficulty Walking,  Skin: No Rash or Lesions. Psych: No Change in Mood or Affect, No Depression or Anxiety, No Memory loss, No Confusion, or Hallucinations   Past Medical History  Diagnosis Date  . Ankle fracture, right 11/27/2014  .  Dislocation of right ankle joint 11/27/2014     Past Surgical History  Procedure Laterality Date  . External fixation leg Right 11/27/2014    Procedure: EXTERNAL FIXATION LEG FOR RIGHT ANKLE FRACTURE;  Surgeon: Myrene Galas, MD;  Location: [facility] OR;  Service: Orthopedics;  Laterality: Right;      Prior to Admission medications   Medication Sig Start Date End Date Taking? Authorizing Provider  albuterol (PROVENTIL) (2.5 MG/3ML) 0.083% nebulizer solution Inhale 3 mLs into the lungs every 6 (six) hours as needed for wheezing or shortness of breath.  09/26/14  Yes Historical Provider, MD  atorvastatin (LIPITOR) 80 MG tablet Take 1 tablet (80 mg total) by mouth daily at 6 PM. 11/30/14  Yes Renae Fickle, MD  cholecalciferol 2000 UNITS TABS Take 1 tablet (2,000 Units total) by mouth 2 (two) times daily. Patient taking differently: Take 2,000 Units by mouth daily.  11/30/14  Yes Renae Fickle, MD  ferrous fumarate (HEMOCYTE - 106 MG FE) 325 (106 FE) MG TABS tablet Take 1 tablet by mouth daily.    Yes Historical Provider, MD  furosemide (LASIX) 20 MG tablet Take 1 tablet (20 mg total) by mouth daily. 12/15/14  Yes Antoine Poche, MD  JANUVIA 100 MG tablet Take 100 mg by mouth daily. 10/26/14  Yes Historical Provider, MD  LORazepam (ATIVAN) 0.5 MG tablet Take 1 tablet (0.5 mg total) by mouth at bedtime. 11/30/14  Yes Renae Fickle, MD  metFORMIN (GLUCOPHAGE) 500 MG tablet Take 500 mg by mouth 3 (three) times daily.    Yes  Historical Provider, MD  metoprolol tartrate (LOPRESSOR) 25 MG tablet Take 0.5 tablets (12.5 mg total) by mouth 2 (two) times daily. 11/30/14  Yes Renae FickleMackenzie Short, MD  mirabegron ER (MYRBETRIQ) 50 MG TB24 tablet Take 50 mg by mouth daily.   Yes Historical Provider, MD  nitroGLYCERIN (NITROSTAT) 0.4 MG SL tablet Place 1 tablet (0.4 mg total) under the tongue every 5 (five) minutes as needed for chest pain. 11/30/14  Yes Renae FickleMackenzie Short, MD  nystatin (MYCOSTATIN/NYSTOP) 100000 UNIT/GM  POWD Apply 1 g topically daily as needed (FOR RASH).  10/21/14  Yes Historical Provider, MD  omeprazole (PRILOSEC) 20 MG capsule Take 20 mg by mouth every morning.   Yes Historical Provider, MD  oxyCODONE-acetaminophen (PERCOCET) 10-325 MG tablet Take 1 tablet by mouth every 6 (six) hours as needed for pain. 11/30/14  Yes Renae FickleMackenzie Short, MD  polyethylene glycol powder (GLYCOLAX/MIRALAX) powder Take 17 g by mouth daily. 11/30/14  Yes Renae FickleMackenzie Short, MD  ranitidine (ZANTAC) 300 MG tablet Take 300 mg by mouth at bedtime. 10/26/14  Yes Historical Provider, MD  warfarin (COUMADIN) 5 MG tablet Take 2.5-5 mg by mouth daily at 6 PM. Every sun, tues, wed, thur, fri, sat. Take 2.5 mg on Monday   Yes Historical Provider, MD  clopidogrel (PLAVIX) 75 MG tablet Take 1 tablet (75 mg total) by mouth daily. Patient not taking: Reported on 01/06/2015 11/30/14   Renae FickleMackenzie Short, MD     Allergies  Allergen Reactions  . Talwin [Pentazocine] Other (See Comments)    Extreme hot feeling  . Fentanyl Other (See Comments)    Fainted?  . Latex Itching    Social History:  reports that she has never smoked. She does not have any smokeless tobacco history on file. She reports that she does not drink alcohol or use illicit drugs.     No family history on file.     Physical Exam:  GEN:  Pleasant  Morbidly Obese Elderly  69 y.o. Caucasian female examined and in no acute distress; cooperative with exam Filed Vitals:   01/26/2015 2115 01/26/2015 2145 01/24/2015 2215 01/28/2015 2230  BP: 137/72 150/79 150/92 126/87  Pulse: 145 107 123 67  Temp:      TempSrc:      Resp: 19 22 24 23   SpO2: 89% 91% 91% 91%   Blood pressure 126/87, pulse 67, temperature 97.9 F (36.6 C), temperature source Oral, resp. rate 23, SpO2 91 %. PSYCH: She is alert and oriented x4; does not appear anxious does not appear depressed; affect is normal HEENT: Normocephalic and Atraumatic, Mucous membranes pink; PERRLA; EOM intact; Fundi:  Benign;  No  scleral icterus, Nares: Patent, Oropharynx: Clear,     Neck:  FROM, No Cervical Lymphadenopathy nor Thyromegaly or Carotid Bruit; No JVD; Breasts:: Not examined CHEST WALL: No tenderness CHEST: Normal respiration, clear to auscultation bilaterally HEART: Regular rate and rhythm; no murmurs rubs or gallops BACK: No kyphosis or scoliosis; No CVA tenderness ABDOMEN: Positive Bowel Sounds, Obese, Soft Non-Tender, No Rebound or Guarding; No Masses, No Organomegaly Rectal Exam: Not done EXTREMITIES: Right LE:  With Immobilizer, and surgical changes,  LLE:   +Venous stasis dermatitis,    Genitalia: not examined PULSES: 2+ and symmetric SKIN: Normal hydration no rash or ulceration CNS:  Alert and Oriented x 4, No Focal Deficits Vascular: pulses palpable throughout    Labs on Admission:  Basic Metabolic Panel:  Recent Labs Lab 01/07/2015 1900  NA 143  K 4.2  CL 91*  CO2  40*  GLUCOSE 162*  BUN 19  CREATININE 0.62  CALCIUM 9.5   Liver Function Tests: No results for input(s): AST, ALT, ALKPHOS, BILITOT, PROT, ALBUMIN in the last 168 hours. No results for input(s): LIPASE, AMYLASE in the last 168 hours. No results for input(s): AMMONIA in the last 168 hours. CBC:  Recent Labs Lab 01/07/2015 1900  WBC 13.1*  NEUTROABS 11.3*  HGB 10.2*  HCT 37.0  MCV 95.9  PLT 354   Cardiac Enzymes: No results for input(s): CKTOTAL, CKMB, CKMBINDEX, TROPONINI in the last 168 hours.  BNP (last 3 results)  Recent Labs  11/27/14 0711 01/17/2015 1900  BNP 163.5* 178.8*    ProBNP (last 3 results) No results for input(s): PROBNP in the last 8760 hours.  CBG: No results for input(s): GLUCAP in the last 168 hours.  Radiological Exams on Admission: Dg Chest Port 1 View  01/19/2015  CLINICAL DATA:  Shortness of breath for 1 day EXAM: PORTABLE CHEST 1 VIEW COMPARISON:  01/18/2015 FINDINGS: Cardiomediastinal silhouette is stable. Atherosclerotic calcifications of thoracic aorta again noted. There  is worsening in aeration with interstitial prominence bilaterally. Findings consistent with worsening pulmonary edema. Superimposed alveolar infiltrates especially in right upper and lower lobe cannot be excluded. Clinical correlation is necessary. IMPRESSION: There is worsening in aeration with interstitial prominence bilaterally. Findings consistent with worsening pulmonary edema. Superimposed alveolar infiltrates especially in right upper and lower lobe cannot be excluded. Clinical correlation is necessary. Electronically Signed   By: Natasha Mead M.D.   On: 01/21/2015 18:39     EKG: Independently reviewed.    Assessment/Plan:   69 y.o. female with  Acute Respiratory Failure with Hypercapnea  BIPAP PRN  Monitor O2 sats   Acute Diastolic CHF  IV Lasix  COPD Exacerbation  DuoNebs   HTN  PRN IV Hydralazine while NPO   DM2  SSI coverage PRN      DVT Prophylaxis      On Coumadin Rx     Code Status:     FULL CODE       Family Communication:  No Family Present    Disposition Plan:    Inpatient  Status        Time spent:  55 Minutes      Ron Parker [facility] Pager [phone]   If 7AM -7PM Please Contact the Day Rounding Team MD for [facility]  If 7PM-7AM, Please Contact Night-Floor Coverage  [email or web address] Password [facility] 01/28/2015, 10:56 PM     ADDENDUM:   Patient was seen and examined on 01/27/2015

## 2015-01-20 NOTE — ED Notes (Signed)
Per EMS, pt coming in from [facility] for SOB. EMS reports initial SpO2 was in the 60's. Pt had audible wheezing in all fields. EMS gave 125 mg of solu-medrol and 3 breathing treatments with a total of 15mg  of albuterol and 1mg  of atrovent. Pt alert x4. Pt wanted to come here since she had foot surgery here in October.

## 2015-01-20 NOTE — ED Provider Notes (Signed)
CSN: [id number]     Arrival date & time 2014-05-12  1756 History   First MD Initiated Contact with Patient 2014-05-12 1757     Chief Complaint  Patient presents with  . Shortness of Breath   69 year old Caucasian female with past medical history of morbid obesity, recent right ankle fracture with ex-fix in October 2016, CHF, and COPD who presents today with respiratory distress by EMS. Shortness of breath. Earlier today and only worsened throughout the day. EMS arrived and found patient hypoxic to the 60s. Placed on nonrebreather with improvement in sats. She was given Solu-Medrol, DuoNebs. She denies CP, cough, fever, chills, N/V, diarrhea, constipation, hematemesis, dysuria, hematuria, sick contacts, or recent travel.   Patient is a 69 y.o. female presenting with shortness of breath. The history is provided by the patient.  Shortness of Breath Severity:  Severe Onset quality:  Sudden Timing:  Constant Progression:  Partially resolved Chronicity:  Recurrent Relieved by:  Oxygen and rest Ineffective treatments:  Inhaler and rest Associated symptoms: no abdominal pain, no chest pain, no cough, no fever, no headaches, no vomiting and no wheezing     Past Medical History  Diagnosis Date  . Ankle fracture, right 11/27/2014  . Dislocation of right ankle joint 11/27/2014   Past Surgical History  Procedure Laterality Date  . External fixation leg Right 11/27/2014    Procedure: EXTERNAL FIXATION LEG FOR RIGHT ANKLE FRACTURE;  Surgeon: Myrene GalasMichael Handy, MD;  [facility];  Service: Orthopedics;  Laterality: Right;   No family history on file. Social History  Substance Use Topics  . Smoking status: Never Smoker   . Smokeless tobacco: None  . Alcohol Use: No   OB History    No data available     Review of Systems  Constitutional: Negative for fever and chills.  Respiratory: Positive for chest tightness and shortness of breath. Negative for cough and wheezing.   Cardiovascular: Negative  for chest pain, palpitations and leg swelling.  Gastrointestinal: Negative for nausea, vomiting, abdominal pain, diarrhea, constipation and abdominal distention.  Genitourinary: Negative for dysuria, frequency, flank pain and decreased urine volume.  Neurological: Negative for dizziness, speech difficulty, light-headedness and headaches.  All other systems reviewed and are negative.     Allergies  Talwin; Fentanyl; and Latex  Home Medications   Prior to Admission medications   Medication Sig Start Date End Date Taking? Authorizing Provider  albuterol (PROVENTIL) (2.5 MG/3ML) 0.083% nebulizer solution Inhale 3 mLs into the lungs every 6 (six) hours as needed for wheezing or shortness of breath.  09/26/14  Yes Historical Provider, MD  atorvastatin (LIPITOR) 80 MG tablet Take 1 tablet (80 mg total) by mouth daily at 6 PM. 11/30/14  Yes Renae FickleMackenzie Short, MD  cholecalciferol 2000 UNITS TABS Take 1 tablet (2,000 Units total) by mouth 2 (two) times daily. Patient taking differently: Take 2,000 Units by mouth daily.  11/30/14  Yes Renae FickleMackenzie Short, MD  ferrous fumarate (HEMOCYTE - 106 MG FE) 325 (106 FE) MG TABS tablet Take 1 tablet by mouth daily.    Yes Historical Provider, MD  furosemide (LASIX) 20 MG tablet Take 1 tablet (20 mg total) by mouth daily. 12/15/14  Yes Antoine PocheJonathan F Branch, MD  JANUVIA 100 MG tablet Take 100 mg by mouth daily. 10/26/14  Yes Historical Provider, MD  LORazepam (ATIVAN) 0.5 MG tablet Take 1 tablet (0.5 mg total) by mouth at bedtime. 11/30/14  Yes Renae FickleMackenzie Short, MD  metFORMIN (GLUCOPHAGE) 500 MG tablet Take 500 mg by  mouth 3 (three) times daily.    Yes Historical Provider, MD  metoprolol tartrate (LOPRESSOR) 25 MG tablet Take 0.5 tablets (12.5 mg total) by mouth 2 (two) times daily. 11/30/14  Yes Renae Fickle, MD  mirabegron ER (MYRBETRIQ) 50 MG TB24 tablet Take 50 mg by mouth daily.   Yes Historical Provider, MD  nitroGLYCERIN (NITROSTAT) 0.4 MG SL tablet Place 1 tablet  (0.4 mg total) under the tongue every 5 (five) minutes as needed for chest pain. 11/30/14  Yes Renae Fickle, MD  nystatin (MYCOSTATIN/NYSTOP) 100000 UNIT/GM POWD Apply 1 g topically daily as needed (FOR RASH).  10/21/14  Yes Historical Provider, MD  omeprazole (PRILOSEC) 20 MG capsule Take 20 mg by mouth every morning.   Yes Historical Provider, MD  oxyCODONE-acetaminophen (PERCOCET) 10-325 MG tablet Take 1 tablet by mouth every 6 (six) hours as needed for pain. 11/30/14  Yes Renae Fickle, MD  polyethylene glycol powder (GLYCOLAX/MIRALAX) powder Take 17 g by mouth daily. 11/30/14  Yes Renae Fickle, MD  ranitidine (ZANTAC) 300 MG tablet Take 300 mg by mouth at bedtime. 10/26/14  Yes Historical Provider, MD  warfarin (COUMADIN) 5 MG tablet Take 2.5-5 mg by mouth daily at 6 PM. Every sun, tues, wed, thur, fri, sat. Take 2.5 mg on Monday   Yes Historical Provider, MD  clopidogrel (PLAVIX) 75 MG tablet Take 1 tablet (75 mg total) by mouth daily. Patient not taking: Reported on 01/19/2015 11/30/14   Renae Fickle, MD   BP 147/73 mmHg  Pulse 67  Temp(Src) 97.9 F (36.6 C) (Oral)  Resp 25  Ht  (1.575 m)  Wt 156.4 kg  BMI 63.05 kg/m2  SpO2 91% Physical Exam  Constitutional: She is oriented to person, place, and time. She appears well-developed. No distress.  Morbidly obese  HENT:  Head: Normocephalic and atraumatic.  Eyes: Pupils are equal, round, and reactive to light.  Cardiovascular: Normal rate, regular rhythm, normal heart sounds and intact distal pulses.  Exam reveals no gallop and no friction rub.   No murmur heard. Pulmonary/Chest: Effort normal and breath sounds normal. No respiratory distress. She has no wheezes. She has no rales. She exhibits no tenderness.  Abdominal: Soft. Bowel sounds are normal. She exhibits no distension and no mass. There is no tenderness. There is no rebound and no guarding.  Musculoskeletal:  Right leg w/ex-fix  Lymphadenopathy:    She has no  cervical adenopathy.  Neurological: She is alert and oriented to person, place, and time. No cranial nerve deficit. Coordination normal.  Skin: Skin is warm and dry. Rash (left leg: chronic venous stasis) noted. She is not diaphoretic.  Nursing note and vitals reviewed.   ED Course  Procedures (including critical care time) Labs Review Labs Reviewed  BASIC METABOLIC PANEL - Abnormal; Notable for the following:    Chloride 91 (*)    CO2 40 (*)    Glucose, Bld 162 (*)    All other components within normal limits  CBC WITH DIFFERENTIAL/PLATELET - Abnormal; Notable for the following:    WBC 13.1 (*)    RBC 3.86 (*)    Hemoglobin 10.2 (*)    MCHC 27.6 (*)    RDW 17.1 (*)    Neutro Abs 11.3 (*)    All other components within normal limits  BRAIN NATRIURETIC PEPTIDE - Abnormal; Notable for the following:    B Natriuretic Peptide 178.8 (*)    All other components within normal limits  PROTIME-INR - Abnormal; Notable for the following:  Prothrombin Time 29.9 (*)    INR 2.91 (*)    All other components within normal limits  I-STAT ARTERIAL BLOOD GAS, ED - Abnormal; Notable for the following:    pH, Arterial 7.312 (*)    pCO2 arterial 94.9 (*)    pO2, Arterial 159.0 (*)    Bicarbonate 48.0 (*)    Acid-Base Excess 17.0 (*)    All other components within normal limits  I-STAT ARTERIAL BLOOD GAS, ED - Abnormal; Notable for the following:    pCO2 arterial 79.6 (*)    pO2, Arterial 51.0 (*)    Bicarbonate 50.0 (*)    Acid-Base Excess 21.0 (*)    All other components within normal limits  POCT I-STAT 3, ART BLOOD GAS (G3+) - Abnormal; Notable for the following:    pCO2 arterial 80.0 (*)    pO2, Arterial 56.0 (*)    Bicarbonate 52.4 (*)    Acid-Base Excess 23.0 (*)    All other components within normal limits  MRSA PCR SCREENING  HEMOGLOBIN A1C  BLOOD GAS, ARTERIAL  BASIC METABOLIC PANEL  CBC  I-STAT TROPOININ, ED    Imaging Review Dg Chest Port 1 View  02/04/15   CLINICAL DATA:  Shortness of breath for 1 day EXAM: PORTABLE CHEST 1 VIEW COMPARISON:  01/18/2015 FINDINGS: Cardiomediastinal silhouette is stable. Atherosclerotic calcifications of thoracic aorta again noted. There is worsening in aeration with interstitial prominence bilaterally. Findings consistent with worsening pulmonary edema. Superimposed alveolar infiltrates especially in right upper and lower lobe cannot be excluded. Clinical correlation is necessary. IMPRESSION: There is worsening in aeration with interstitial prominence bilaterally. Findings consistent with worsening pulmonary edema. Superimposed alveolar infiltrates especially in right upper and lower lobe cannot be excluded. Clinical correlation is necessary. Electronically Signed   By: Natasha Mead M.D.   On: February 04, 2015 18:39   I have personally reviewed and evaluated these images and lab results as part of my medical decision-making.   EKG Interpretation None      MDM   Final diagnoses:  Respiratory distress  COPD exacerbation (HCC)  Acute on chronic systolic congestive heart failure (HCC)   69 year old Caucasian female here for a storage. Please see history of present illness for details. On exam patient in no acute distress, satting well on nonrebreather. Still poor air movement.  Chest x-ray consistent with pulmonary edema, likely secondary to CHF exacerbation. Patient was given Lasix prior to her leaving her facility. Will not re-dose here. ABG shows she is a chronic retainer with initial CO2's in the 90s but improved after BiPAP. BNP is elevated at over 150.   Given her hypoxic episode earlier in concern for COPD/CHF overlap will admit to hospitalist for continued treatment.  Pt was seen under the supervision of Dr. Madilyn Hook.     Rachelle Hora, MD 01/21/15 1610  Tilden Fossa, MD 01/22/15 [phone]

## 2015-01-20 NOTE — ED Notes (Signed)
Jenkins, MD at bedside.  

## 2015-01-20 NOTE — Progress Notes (Signed)
ABG shown to MD. Will continue to monitor.

## 2015-01-21 ENCOUNTER — Encounter (HOSPITAL_COMMUNITY): Payer: Self-pay

## 2015-01-21 DIAGNOSIS — L8990 Pressure ulcer of unspecified site, unspecified stage: Secondary | ICD-10-CM | POA: Insufficient documentation

## 2015-01-21 DIAGNOSIS — J9602 Acute respiratory failure with hypercapnia: Secondary | ICD-10-CM

## 2015-01-21 DIAGNOSIS — E662 Morbid (severe) obesity with alveolar hypoventilation: Secondary | ICD-10-CM | POA: Diagnosis present

## 2015-01-21 DIAGNOSIS — I4891 Unspecified atrial fibrillation: Secondary | ICD-10-CM | POA: Diagnosis present

## 2015-01-21 LAB — POCT I-STAT 3, ART BLOOD GAS (G3+)
Acid-Base Excess: 23 mmol/L — ABNORMAL HIGH (ref 0.0–2.0)
Bicarbonate: 52.4 mEq/L — ABNORMAL HIGH (ref 20.0–24.0)
O2 SAT: 87 %
PCO2 ART: 80 mmHg — AB (ref 35.0–45.0)
PO2 ART: 56 mmHg — AB (ref 80.0–100.0)
Patient temperature: 98.6
pH, Arterial: 7.424 (ref 7.350–7.450)

## 2015-01-21 LAB — BASIC METABOLIC PANEL
Anion gap: 8 (ref 5–15)
BUN: 17 mg/dL (ref 6–20)
CO2: 45 mmol/L — ABNORMAL HIGH (ref 22–32)
Calcium: 9.4 mg/dL (ref 8.9–10.3)
Chloride: 89 mmol/L — ABNORMAL LOW (ref 101–111)
Creatinine, Ser: 0.57 mg/dL (ref 0.44–1.00)
GFR calc Af Amer: >60 mL/min (ref >60)
GFR calc non Af Amer: >60 mL/min (ref >60)
Glucose, Bld: 182 mg/dL — ABNORMAL HIGH (ref 65–99)
Potassium: 4 mmol/L (ref 3.5–5.1)
Sodium: 142 mmol/L (ref 135–145)

## 2015-01-21 LAB — GLUCOSE, CAPILLARY
GLUCOSE-CAPILLARY: 168 mg/dL — AB (ref 65–99)
GLUCOSE-CAPILLARY: 171 mg/dL — AB (ref 65–99)
Glucose-Capillary: 168 mg/dL — ABNORMAL HIGH (ref 65–99)
Glucose-Capillary: 166 mg/dL — ABNORMAL HIGH (ref 65–99)
Glucose-Capillary: 151 mg/dL — ABNORMAL HIGH (ref 65–99)

## 2015-01-21 LAB — CBC
HCT: 36.3 % (ref 36.0–46.0)
Hemoglobin: 10.3 g/dL — ABNORMAL LOW (ref 12.0–15.0)
MCH: 26.5 pg (ref 26.0–34.0)
MCHC: 28.4 g/dL — ABNORMAL LOW (ref 30.0–36.0)
MCV: 93.6 fL (ref 78.0–100.0)
Platelets: 352 10*3/uL (ref 150–400)
RBC: 3.88 MIL/uL (ref 3.87–5.11)
RDW: 17.1 % — ABNORMAL HIGH (ref 11.5–15.5)
WBC: 9.6 10*3/uL (ref 4.0–10.5)

## 2015-01-21 LAB — PHOSPHORUS: Phosphorus: 3.3 mg/dL (ref 2.5–4.6)

## 2015-01-21 LAB — MRSA PCR SCREENING: MRSA by PCR: NEGATIVE

## 2015-01-21 LAB — MAGNESIUM: MAGNESIUM: 1.6 mg/dL — AB (ref 1.7–2.4)

## 2015-01-21 MED ORDER — POTASSIUM CHLORIDE CRYS ER 20 MEQ PO TBCR
20.0000 meq | EXTENDED_RELEASE_TABLET | Freq: Every day | ORAL | Status: DC
  Administered 2015-01-21 – 2015-01-31 (×11): 20 meq via ORAL
  Filled 2015-01-21 (×13): qty 1

## 2015-01-21 MED ORDER — METOPROLOL TARTRATE 1 MG/ML IV SOLN
2.5000 mg | Freq: Four times a day (QID) | INTRAVENOUS | Status: DC | PRN
  Administered 2015-01-21 – 2015-02-02 (×3): 2.5 mg via INTRAVENOUS
  Filled 2015-01-21 (×3): qty 5

## 2015-01-21 MED ORDER — WARFARIN SODIUM 5 MG PO TABS
5.0000 mg | ORAL_TABLET | ORAL | Status: DC
  Administered 2015-01-21: 5 mg via ORAL
  Filled 2015-01-21: qty 1

## 2015-01-21 MED ORDER — SODIUM CHLORIDE 0.9 % IV SOLN: 250.0000 mL | INTRAVENOUS | Status: DC | PRN

## 2015-01-21 MED ORDER — WARFARIN - PHARMACIST DOSING INPATIENT
Freq: Every day | Status: DC
  Administered 2015-01-21: 1
  Administered 2015-01-23 – 2015-01-29 (×3)
  Administered 2015-01-30: 1

## 2015-01-21 MED ORDER — METOPROLOL TARTRATE 12.5 MG HALF TABLET
12.5000 mg | ORAL_TABLET | Freq: Two times a day (BID) | ORAL | Status: DC
  Administered 2015-01-21: 12.5 mg via ORAL
  Filled 2015-01-21: qty 1

## 2015-01-21 MED ORDER — SODIUM CHLORIDE 0.9 % IJ SOLN
3.0000 mL | Freq: Two times a day (BID) | INTRAMUSCULAR | Status: DC
  Administered 2015-01-21 – 2015-02-02 (×18): 3 mL via INTRAVENOUS

## 2015-01-21 MED ORDER — WARFARIN SODIUM 2.5 MG PO TABS: 2.5000 mg | ORAL_TABLET | ORAL | Status: DC

## 2015-01-21 MED ORDER — LEVALBUTEROL HCL 0.63 MG/3ML IN NEBU
0.6300 mg | INHALATION_SOLUTION | Freq: Three times a day (TID) | RESPIRATORY_TRACT | Status: DC
  Administered 2015-01-22 – 2015-01-24 (×9): 0.63 mg via RESPIRATORY_TRACT
  Filled 2015-01-21 (×9): qty 3

## 2015-01-21 MED ORDER — CLOPIDOGREL BISULFATE 75 MG PO TABS
75.0000 mg | ORAL_TABLET | Freq: Every day | ORAL | Status: DC
  Administered 2015-01-21 – 2015-01-31 (×11): 75 mg via ORAL
  Filled 2015-01-21 (×13): qty 1

## 2015-01-21 MED ORDER — PANTOPRAZOLE SODIUM 40 MG IV SOLR
40.0000 mg | INTRAVENOUS | Status: DC
  Administered 2015-01-21: 40 mg via INTRAVENOUS
  Filled 2015-01-21: qty 40

## 2015-01-21 MED ORDER — FUROSEMIDE 10 MG/ML IJ SOLN: 40.0000 mg | Freq: Two times a day (BID) | INTRAMUSCULAR | Status: DC

## 2015-01-21 MED ORDER — SODIUM CHLORIDE 0.9 % IJ SOLN: 3.0000 mL | INTRAMUSCULAR | Status: DC | PRN

## 2015-01-21 MED ORDER — FUROSEMIDE 10 MG/ML IJ SOLN
40.0000 mg | Freq: Two times a day (BID) | INTRAMUSCULAR | Status: AC
  Administered 2015-01-21 – 2015-01-22 (×4): 40 mg via INTRAVENOUS
  Filled 2015-01-21 (×4): qty 4

## 2015-01-21 MED ORDER — MIRABEGRON ER 50 MG PO TB24
50.0000 mg | ORAL_TABLET | Freq: Every day | ORAL | Status: DC
  Administered 2015-01-21 – 2015-01-30 (×10): 50 mg via ORAL
  Filled 2015-01-21 (×14): qty 1

## 2015-01-21 MED ORDER — CETYLPYRIDINIUM CHLORIDE 0.05 % MT LIQD
7.0000 mL | Freq: Two times a day (BID) | OROMUCOSAL | Status: DC
  Administered 2015-01-21 – 2015-02-01 (×14): 7 mL via OROMUCOSAL

## 2015-01-21 MED ORDER — METOPROLOL TARTRATE 25 MG PO TABS
25.0000 mg | ORAL_TABLET | Freq: Two times a day (BID) | ORAL | Status: DC
  Administered 2015-01-21 – 2015-01-30 (×19): 25 mg via ORAL
  Filled 2015-01-21 (×2): qty 1
  Filled 2015-01-21: qty 2
  Filled 2015-01-21 (×19): qty 1

## 2015-01-21 NOTE — Progress Notes (Signed)
Pt in NSR having runs of SVT that are increasing in frequency and rate (up to 135) from time of admission. [facility] on call notified. Will continue to monitor.

## 2015-01-21 NOTE — Consult Note (Signed)
Name: [name] MRN: [id number] DOB: 02-Jan-1946    ADMISSION DATE:  01/19/2015 CONSULTATION DATE:  01/21/2015  REFERRING MD :  Dr. Lovell Sheehan  CHIEF COMPLAINT:  SOB  BRIEF PATIENT DESCRIPTION: 69 y.o. female with a history of COPD, Diastolic CHF, HTN, and DM2, and ORIF of right Ankle Fx who was sent from the [facility] to the Ed due to worsening SOB. O2 sats at the facility were reportedly 60's. She was administered Lasix at the facility, and placed on 100% O2. In the ED, she was evaluated and placed on BIPAP, after and ABD revealed a ph of 7.31 with a pCO2 of 94.6. She was referred for admission.    HISTORY OF PRESENT ILLNESS:   She states she continues to feel SOB but no worse than before.  She denies chest pain but reports orthopnea.    PAST MEDICAL HISTORY :   has a past medical history of Ankle fracture, right (11/27/2014) and Dislocation of right ankle joint (11/27/2014).  has past surgical history that includes External fixation leg (Right, 11/27/2014). Prior to Admission medications   Medication Sig Start Date End Date Taking? Authorizing Provider  albuterol (PROVENTIL) (2.5 MG/3ML) 0.083% nebulizer solution Inhale 3 mLs into the lungs every 6 (six) hours as needed for wheezing or shortness of breath.  09/26/14  Yes Historical Provider, MD  atorvastatin (LIPITOR) 80 MG tablet Take 1 tablet (80 mg total) by mouth daily at 6 PM. 11/30/14  Yes Renae Fickle, MD  cholecalciferol 2000 UNITS TABS Take 1 tablet (2,000 Units total) by mouth 2 (two) times daily. Patient taking differently: Take 2,000 Units by mouth daily.  11/30/14  Yes Renae Fickle, MD  ferrous fumarate (HEMOCYTE - 106 MG FE) 325 (106 FE) MG TABS tablet Take 1 tablet by mouth daily.    Yes Historical Provider, MD  furosemide (LASIX) 20 MG tablet Take 1 tablet (20 mg total) by mouth daily. 12/15/14  Yes Antoine Poche, MD  JANUVIA 100 MG tablet Take 100 mg by mouth daily. 10/26/14  Yes Historical  Provider, MD  LORazepam (ATIVAN) 0.5 MG tablet Take 1 tablet (0.5 mg total) by mouth at bedtime. 11/30/14  Yes Renae Fickle, MD  metFORMIN (GLUCOPHAGE) 500 MG tablet Take 500 mg by mouth 3 (three) times daily.    Yes Historical Provider, MD  metoprolol tartrate (LOPRESSOR) 25 MG tablet Take 0.5 tablets (12.5 mg total) by mouth 2 (two) times daily. 11/30/14  Yes Renae Fickle, MD  mirabegron ER (MYRBETRIQ) 50 MG TB24 tablet Take 50 mg by mouth daily.   Yes Historical Provider, MD  nitroGLYCERIN (NITROSTAT) 0.4 MG SL tablet Place 1 tablet (0.4 mg total) under the tongue every 5 (five) minutes as needed for chest pain. 11/30/14  Yes Renae Fickle, MD  nystatin (MYCOSTATIN/NYSTOP) 100000 UNIT/GM POWD Apply 1 g topically daily as needed (FOR RASH).  10/21/14  Yes Historical Provider, MD  omeprazole (PRILOSEC) 20 MG capsule Take 20 mg by mouth every morning.   Yes Historical Provider, MD  oxyCODONE-acetaminophen (PERCOCET) 10-325 MG tablet Take 1 tablet by mouth every 6 (six) hours as needed for pain. 11/30/14  Yes Renae Fickle, MD  polyethylene glycol powder (GLYCOLAX/MIRALAX) powder Take 17 g by mouth daily. 11/30/14  Yes Renae Fickle, MD  ranitidine (ZANTAC) 300 MG tablet Take 300 mg by mouth at bedtime. 10/26/14  Yes Historical Provider, MD  warfarin (COUMADIN) 5 MG tablet Take 2.5-5 mg by mouth daily at 6 PM. Every sun, tues, wed, thur, fri,  sat. Take 2.5 mg on Monday   Yes Historical Provider, MD  clopidogrel (PLAVIX) 75 MG tablet Take 1 tablet (75 mg total) by mouth daily. Patient not taking: Reported on 01/05/2015 11/30/14   Renae Fickle, MD   Allergies  Allergen Reactions  . Talwin [Pentazocine] Other (See Comments)    Extreme hot feeling  . Fentanyl Other (See Comments)    Fainted?  . Latex Itching    FAMILY HISTORY:  family history is not on file. SOCIAL HISTORY:  reports that she has never smoked. She does not have any smokeless tobacco history on file. She reports that she  does not drink alcohol or use illicit drugs.  REVIEW OF SYSTEMS:   + SOB, DOE, Orthopnea, PND, LE edema, palpitations - for: wheezing, leg pain, chest pain, dysuria  SUBJECTIVE:   VITAL SIGNS: Temp:  [97.9 F (36.6 C)-98.6 F (37 C)] 98.6 F (37 C) (12/24 0328) Pulse Rate:  [67-145] 113 (12/24 0328) Resp:  [19-25] 25 (12/24 0328) BP: (117-150)/(58-98) 137/65 mmHg (12/24 0328) SpO2:  [84 %-100 %] 93 % (12/24 0328) Weight:  [156.4 kg (344 lb 12.8 oz)] 156.4 kg (344 lb 12.8 oz) (12/24 0000)  PHYSICAL EXAMINATION: General:  Obese, NAD Neuro:  CN II-XII intact, no asterixis HEENT:  MMM, NCAT Cardiovascular:  Tachycardic, irregular,  No m/r/g.  + JVD 12cm, + Hepatojugular reflux Lungs:  Bibasilar rales.  No wheezing Abdomen:  Soft, nontender, not distented Musculoskeletal:  Right external ankle fixation device in place Skin:  + 2+ pitting edema b/l, skin is warm   Recent Labs Lab 01/13/2015 1900  NA 143  K 4.2  CL 91*  CO2 40*  BUN 19  CREATININE 0.62  GLUCOSE 162*    Recent Labs Lab 01/09/2015 1900  HGB 10.2*  HCT 37.0  WBC 13.1*  PLT 354   Dg Chest Port 1 View  01/28/2015  CLINICAL DATA:  Shortness of breath for 1 day EXAM: PORTABLE CHEST 1 VIEW COMPARISON:  01/18/2015 FINDINGS: Cardiomediastinal silhouette is stable. Atherosclerotic calcifications of thoracic aorta again noted. There is worsening in aeration with interstitial prominence bilaterally. Findings consistent with worsening pulmonary edema. Superimposed alveolar infiltrates especially in right upper and lower lobe cannot be excluded. Clinical correlation is necessary. IMPRESSION: There is worsening in aeration with interstitial prominence bilaterally. Findings consistent with worsening pulmonary edema. Superimposed alveolar infiltrates especially in right upper and lower lobe cannot be excluded. Clinical correlation is necessary. Electronically Signed   By: Natasha Mead M.D.   On: 01/11/2015 18:39    ASSESSMENT  / PLAN:  69 yo female with acute on chronic hypoxemic and hypercapnic respiratory failure.  Her hypercapnia likely due to OHS and is worsened by pulmonary edema.  After initial bipap tx her ABG is now well compensated and I would not recommend further correction as she is chronically hypercapnic.  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  Would recommend:  - Bipap at night for OHS  - no further correction of PaCO2  - diuresis with goal net negative 2L daily as BP and Scr tolerate  - Salt restriction  - cardiology consultation for assistance with CHF  - TTE with euvolemic.  - Given therapeutic anticoagulation a PE is unlikely underlying cause  - continue warfarin for INR goal 2-3  Total critical care time: 30 min  Critical care time was exclusive of separately billable procedures and treating other patients.  Critical  care was necessary to treat or prevent imminent or life-threatening deterioration.  Critical care was time spent personally by me on the following activities: development of treatment plan with patient and/or surrogate as well as nursing, discussions with consultants, evaluation of patient's response to treatment, examination of patient, obtaining history from patient or surrogate, ordering and performing treatments and interventions, ordering and review of laboratory studies, ordering and review of radiographic studies, pulse oximetry and re-evaluation of patient's condition.   Galvin Profferaniel Johnson Arizola, DO., MS [facility]    [facility]ulmonary and Critical Care Medicine [facility] Pager: [phone]  01/21/2015, 5:45 AM

## 2015-01-21 NOTE — Progress Notes (Signed)
[facility] PROGRESS NOTE  [name] RUE:[id number] DOB: Nov 10, 1945 DOA: 27-Jan-2015  PCP: No primary care provider on file.  Brief HPI: 69 year old Caucasian female with a past medical history of morbid obesity, COPD, diastolic CHF, hypertension, who was recently hospitalized for a fracture of the right ankle status post ORIF and is currently in a skilled nursing facility. She presented to the emergency department with complaints of worsening shortness of breath. She was noted to be hypoxic into the 60s and 70s. X-ray showed pulmonary edema. She was hospitalized for further management.  Past medical history:  Past Medical History  Diagnosis Date  . Ankle fracture, right 11/27/2014  . Dislocation of right ankle joint 11/27/2014  . COPD (chronic obstructive pulmonary disease) (HCC)   . Shortness of breath dyspnea   . Pneumonia   . Diabetes mellitus without complication (HCC)   . Dementia   . Anxiety     Consultants: Pulmonology  Procedures:  Echocardiogram 11/29/14 Study Conclusions - Left ventricle: The cavity size was normal. Wall thickness wasnormal. Systolic function was normal. The estimated ejectionfraction was in the range of 50% to 55%. Wall motion was normal;there were no regional wall motion abnormalities. Features areconsistent with a pseudonormal left ventricular filling pattern,with concomitant abnormal relaxation and increased fillingpressure (grade 2 diastolic dysfunction). - Aortic valve: There was trivial regurgitation. - Right ventricle: The cavity size was mildly dilated. Systolicfunction was mildly to moderately reduced. - Tricuspid valve: There was mild-moderate regurgitation. - Pulmonary arteries: Systolic pressure was moderately increased.PA peak pressure: 50 mm Hg (S).  Antibiotics: None  Subjective: Patient feels slightly better this morning. She states that her breathing is improved. Denies any chest pain. Does have a cough. Denies any  nausea, vomiting.  Objective: Vital Signs  Filed Vitals:   01/21/15 1000 01/21/15 1130 01/21/15 1200 01/21/15 1214  BP: 132/79 130/69    Pulse: 91 85    Temp:  98.1 F (36.7 C)    TempSrc:  Axillary    Resp: 21 23    Height:      Weight:   156.491 kg (345 lb)   SpO2: 93% 96%  97%    Intake/Output Summary (Last 24 hours) at 01/21/15 1352 Last data filed at 01/21/15 1300  Gross per 24 hour  Intake    480 ml  Output   2477 ml  Net  -1997 ml   Filed Weights   01/21/15 0000 01/21/15 1200  Weight: 156.4 kg (344 lb 12.8 oz) 156.491 kg (345 lb)    General appearance: alert, cooperative, appears stated age and morbidly obese Resp: Diminished air entry at the bases. crackles heard bilaterally. No rhonchi. No wheezing. Cardio: S1, S2 is irregularly irregular. No S3, S4. No rubs, murmurs, or bruit. Minimal pedal edema is noted. GI: Obese. Nontender. Bowel sounds present. No obvious hepatomegaly or splenomegaly. Body habitus limits examination. Extremities: Edema noted bilateral lower extremities Neurologic: Awake, alert. Oriented 3. No focal neurological deficits.  Lab Results:  Basic Metabolic Panel:  Recent Labs Lab 01-27-15 1900 01/21/15 0439  NA 143 142  K 4.2 4.0  CL 91* 89*  CO2 40* 45*  GLUCOSE 162* 182*  BUN 19 17  CREATININE 0.62 0.57  CALCIUM 9.5 9.4  MG  --  1.6*  PHOS  --  3.3   CBC:  Recent Labs Lab January 27, 2015 1900 01/21/15 0439  WBC 13.1* 9.6  NEUTROABS 11.3*  --   HGB 10.2* 10.3*  HCT 37.0 36.3  MCV 95.9 93.6  PLT 354 352   BNP (last 3 results)  Recent Labs  11/27/14 0711 Mar 04, 2014 1900  BNP 163.5* 178.8*    CBG:  Recent Labs Lab Mar 04, 2014 2353 01/21/15 0325 01/21/15 0743 01/21/15 1131  GLUCAP 171* 168* 168* 166*    Recent Results (from the past 240 hour(s))  MRSA PCR Screening     Status: None   Collection Time: Mar 04, 2014 11:32 PM  Result Value Ref Range Status   MRSA by PCR NEGATIVE NEGATIVE Final    Comment:        The  GeneXpert MRSA Assay (FDA approved for NASAL specimens only), is one component of a comprehensive MRSA colonization surveillance program. It is not intended to diagnose MRSA infection nor to guide or monitor treatment for MRSA infections.       Studies/Results: Dg Chest Port 1 View  April 10, 2014  CLINICAL DATA:  Shortness of breath for 1 day EXAM: PORTABLE CHEST 1 VIEW COMPARISON:  01/18/2015 FINDINGS: Cardiomediastinal silhouette is stable. Atherosclerotic calcifications of thoracic aorta again noted. There is worsening in aeration with interstitial prominence bilaterally. Findings consistent with worsening pulmonary edema. Superimposed alveolar infiltrates especially in right upper and lower lobe cannot be excluded. Clinical correlation is necessary. IMPRESSION: There is worsening in aeration with interstitial prominence bilaterally. Findings consistent with worsening pulmonary edema. Superimposed alveolar infiltrates especially in right upper and lower lobe cannot be excluded. Clinical correlation is necessary. Electronically Signed   By: Natasha MeadLiviu  Pop M.D.   On: April 10, 2014 18:39    Medications:  Scheduled: . antiseptic oral rinse  7 mL Mouth Rinse BID  . clopidogrel  75 mg Oral Daily  . furosemide  40 mg Intravenous BID  . insulin aspart  0-9 Units Subcutaneous 6 times per day  . ipratropium-albuterol  3 mL Nebulization Q4H  . metoprolol tartrate  12.5 mg Oral BID  . mirabegron ER  50 mg Oral Daily  . pantoprazole (PROTONIX) IV  40 mg Intravenous Q24H  . potassium chloride  20 mEq Oral Daily  . sodium chloride  3 mL Intravenous Q12H  . sodium chloride  3 mL Intravenous Q12H  . [START ON 01/23/2015] warfarin  2.5 mg Oral Q Mon-1800  . warfarin  5 mg Oral Once per day on Sun Tue Wed Thu Fri Sat  . Warfarin - Pharmacist Dosing Inpatient   Does not apply q1800   Continuous:  WUJ:WJXBJYPRN:sodium chloride, sodium chloride, acetaminophen **OR** acetaminophen, HYDROmorphone (DILAUDID) injection,  metoprolol, ondansetron **OR** ondansetron (ZOFRAN) IV, sodium chloride, sodium chloride  Assessment/Plan:  Active Problems:   CHF exacerbation (HCC)   Acute respiratory failure with hypercapnia (HCC)   Pressure ulcer    Acute respiratory failure with hypoxia and hypercapnia She appears to be a chronic CO2 retainer. Seen by pulmonology. BiPAP has been ordered as needed. She was also hypoxic. Chest x-ray suggestive of pulmonary edema. She has been given Lasix with improvement. She is diuresing. Continue to monitor.  Acute on chronic diastolic CHF Continue diuretics. Monitor ins and outs. Daily weights. Echocardiogram from November was reviewed. No need to repeat one at this time.  Atrial fibrillation with mild RVR Review of previous notes suggest that this is a new finding for her. Continue beta blockers. May need to increase dose. She is already on anticoagulation for history of PE. Continue for now.  Obesity hypoventilation syndrome Likely reason for her hypercapnia. Pulmonology is following. BiPAP as needed. Avoid overcorrection.  Recent NSTEMi She was seen by cardiology during recent hospitalization. She was started on  Plavix. She has been managed medically. Due to her morbid obesity, stress test will be of low sensitivity. Continue beta blockers. Resume statin as soon as possible.  History of recurrent PEs Continue warfarin per pharmacy.  Recent ORIF to right ankle Stable. To be followed by orthopedics in the next week or so.  Normocytic anemia Continue to monitor hemoglobin. No evidence for overt bleeding.   DVT Prophylaxis: On warfarin    Code Status: Full code  Family Communication: Discussed with the patient  Disposition Plan: Remain in step down for today.    LOS: 1 day   [facility]  [facility] Pager [phone] 01/21/2015, 1:52 PM  If 7PM-7AM, please contact night-coverage at [email or web address], password [facility]

## 2015-01-21 NOTE — Progress Notes (Signed)
Patient still having short bursts of sinus tach up to 130's. EKG performed reading atrial fib/RVR. Patient is asymptomatic, BP stable. Rito EhrlichKrishnan, MD aware, new orders given.

## 2015-01-21 NOTE — Progress Notes (Signed)
Repeat ABG obtained on 5l . PH 7.42/ CO2 80/ Po2/ 56/ HCO3 52. SPO2 87%. Bipap ordered in ED and not started (ok per ED MD). Pt on no distress, BBS = diminished/clear. HR in 90's but increased to 130's during/after duoneb tx. RT will continue to monitor.

## 2015-01-21 NOTE — Progress Notes (Signed)
ANTICOAGULATION CONSULT NOTE - Initial Consult  Pharmacy Consult for warfarin Indication: atrial fibrillation  Allergies  Allergen Reactions  . Talwin [Pentazocine] Other (See Comments)    Extreme hot feeling  . Fentanyl Other (See Comments)    Fainted?  . Latex Itching    Patient Measurements: Height: 5\' 2"  (157.5 cm) Weight:  (bed does not weigh accurately, pt unable to stand) IBW/kg (Calculated) : 50.1   Vital Signs: Temp: 98.1 F (36.7 C) (12/24 1130) Temp Source: Axillary (12/24 1130) BP: 130/69 mmHg (12/24 1130) Pulse Rate: 85 (12/24 1130)  Labs:  Recent Labs  01/21/2015 1900 01/21/15 0439  HGB 10.2* 10.3*  HCT 37.0 36.3  PLT 354 352  LABPROT 29.9*  --   INR 2.91*  --   CREATININE 0.62 0.57    Estimated Creatinine Clearance: 97 mL/min (by C-G formula based on Cr of 0.57).   Medical History: Past Medical History  Diagnosis Date  . Ankle fracture, right 11/27/2014  . Dislocation of right ankle joint 11/27/2014      Assessment: 69[facility] SO[facility], O2 sats at the facility were reportedly 60's placed on BiPap and given IV furosemide.   PMH: COPD, Diastolic CHF, HTN, and DM2, and ORIF of right Ankle Fx 10/16 Patient with Afib RVR 130s, BP stable 130/70, Cr stable 0.6, INR on adit 2.9 - last warfarin dose PTA 12/22  Warfarin dose PTA 2.5mg  Mon and 5mg  all other days  Goal of Therapy:  INR 2-3 Monitor platelets by anticoagulation protocol: Yes   Plan:  Warfarin 2.5mg  Mon and 5mg  all other days - home dose Protime daily  Leota SauersLisa Jinelle Butchko Pharm.D. CPP, BCPS Clinical Pharmacist [phone] 01/21/2015 12:59 PM

## 2015-01-21 NOTE — Progress Notes (Signed)
eLink Physician-Brief Progress Note Patient Name: [name] DOB: 05-Oct-1945 MRN: [id number]   Date of Service  01/21/2015  HPI/Events of Note  Hospitalist contacted regarding elevated pCO2. patient appropriately compensated. Camera check shows patient with only mildly increased work of breathing on nasal cannula oxygen. Able to speak in complete sentences. Reports she is breathing okay at this time.   eICU Interventions   continuing close monitoring with frequent camera checks.      Intervention Category Major Interventions: Hypoxemia - evaluation and management  Lawanda CousinsJennings Shaunda Tipping 01/21/2015, 1:43 AM

## 2015-01-22 ENCOUNTER — Inpatient Hospital Stay (HOSPITAL_COMMUNITY): Payer: Medicare Other

## 2015-01-22 DIAGNOSIS — I480 Paroxysmal atrial fibrillation: Secondary | ICD-10-CM

## 2015-01-22 LAB — BASIC METABOLIC PANEL
ANION GAP: 7 (ref 5–15)
BUN: 16 mg/dL (ref 6–20)
CO2: 47 mmol/L — AB (ref 22–32)
Calcium: 9.4 mg/dL (ref 8.9–10.3)
Chloride: 89 mmol/L — ABNORMAL LOW (ref 101–111)
Creatinine, Ser: 0.59 mg/dL (ref 0.44–1.00)
GFR calc Af Amer: >60 mL/min (ref >60)
GFR calc non Af Amer: >60 mL/min (ref >60)
GLUCOSE: 125 mg/dL — AB (ref 65–99)
POTASSIUM: 3.5 mmol/L (ref 3.5–5.1)
Sodium: 143 mmol/L (ref 135–145)

## 2015-01-22 LAB — GLUCOSE, CAPILLARY
GLUCOSE-CAPILLARY: 156 mg/dL — AB (ref 65–99)
GLUCOSE-CAPILLARY: 124 mg/dL — AB (ref 65–99)
GLUCOSE-CAPILLARY: 119 mg/dL — AB (ref 65–99)
GLUCOSE-CAPILLARY: 157 mg/dL — AB (ref 65–99)
GLUCOSE-CAPILLARY: 162 mg/dL — AB (ref 65–99)
Glucose-Capillary: 139 mg/dL — ABNORMAL HIGH (ref 65–99)
Glucose-Capillary: 151 mg/dL — ABNORMAL HIGH (ref 65–99)

## 2015-01-22 LAB — CBC
HEMATOCRIT: 34.1 % — AB (ref 36.0–46.0)
Hemoglobin: 9.9 g/dL — ABNORMAL LOW (ref 12.0–15.0)
MCH: 26.6 pg (ref 26.0–34.0)
MCHC: 29 g/dL — ABNORMAL LOW (ref 30.0–36.0)
MCV: 91.7 fL (ref 78.0–100.0)
Platelets: 369 10*3/uL (ref 150–400)
RBC: 3.72 MIL/uL — ABNORMAL LOW (ref 3.87–5.11)
RDW: 17.3 % — AB (ref 11.5–15.5)
WBC: 11.6 10*3/uL — AB (ref 4.0–10.5)

## 2015-01-22 LAB — PROTIME-INR
INR: 3.56 — ABNORMAL HIGH (ref 0.00–1.49)
Prothrombin Time: 34.8 seconds — ABNORMAL HIGH (ref 11.6–15.2)

## 2015-01-22 MED ORDER — INSULIN ASPART 100 UNIT/ML ~~LOC~~ SOLN
0.0000 [IU] | Freq: Three times a day (TID) | SUBCUTANEOUS | Status: DC
  Administered 2015-01-23: 1 [IU] via SUBCUTANEOUS
  Administered 2015-01-23: 2 [IU] via SUBCUTANEOUS
  Administered 2015-01-24 – 2015-01-25 (×3): 1 [IU] via SUBCUTANEOUS
  Administered 2015-01-25 – 2015-01-26 (×2): 2 [IU] via SUBCUTANEOUS
  Administered 2015-01-26: 1 [IU] via SUBCUTANEOUS
  Administered 2015-01-26: 2 [IU] via SUBCUTANEOUS
  Administered 2015-01-27 – 2015-01-30 (×10): 1 [IU] via SUBCUTANEOUS
  Administered 2015-01-30 – 2015-01-31 (×3): 2 [IU] via SUBCUTANEOUS
  Administered 2015-02-01: 1 [IU] via SUBCUTANEOUS
  Administered 2015-02-01: 2 [IU] via SUBCUTANEOUS
  Administered 2015-02-01: 1 [IU] via SUBCUTANEOUS

## 2015-01-22 MED ORDER — PANTOPRAZOLE SODIUM 40 MG PO TBEC
40.0000 mg | DELAYED_RELEASE_TABLET | Freq: Every day | ORAL | Status: DC
  Administered 2015-01-22 – 2015-01-31 (×10): 40 mg via ORAL
  Filled 2015-01-22 (×11): qty 1

## 2015-01-22 NOTE — Progress Notes (Signed)
[facility] PROGRESS NOTE  [name] ZOX:[id number] DOB: Aug 02, 1945 DOA: 02/28/2015  PCP: No primary care provider on file.  Brief HPI: 69 year old Caucasian female with a past medical history of morbid obesity, COPD, diastolic CHF, hypertension, who was recently hospitalized for a fracture of the right ankle status post ORIF and is currently in a skilled nursing facility. She presented to the emergency department with complaints of worsening shortness of breath. She was noted to be hypoxic into the 60s and 70s. X-ray showed pulmonary edema. She was hospitalized for further management.  Past medical history:  Past Medical History  Diagnosis Date  . Ankle fracture, right 11/27/2014  . Dislocation of right ankle joint 11/27/2014  . Shortness of breath dyspnea   . Pneumonia   . Diabetes mellitus without complication (HCC)   . Dementia   . Anxiety   . Hypertension   . CHF (congestive heart failure) (HCC)     Diastolic  . Obesity   . COPD (chronic obstructive pulmonary disease) (HCC)     Home O2 4L nasal cannula    Consultants: Pulmonology  Procedures:  Echocardiogram 11/29/14 Study Conclusions - Left ventricle: The cavity size was normal. Wall thickness wasnormal. Systolic function was normal. The estimated ejectionfraction was in the range of 50% to 55%. Wall motion was normal;there were no regional wall motion abnormalities. Features areconsistent with a pseudonormal left ventricular filling pattern,with concomitant abnormal relaxation and increased fillingpressure (grade 2 diastolic dysfunction). - Aortic valve: There was trivial regurgitation. - Right ventricle: The cavity size was mildly dilated. Systolicfunction was mildly to moderately reduced. - Tricuspid valve: There was mild-moderate regurgitation. - Pulmonary arteries: Systolic pressure was moderately increased.PA peak pressure: 50 mm Hg (S).  Antibiotics: None  Subjective: Patient feels about the  same this morning. Does not feel that the breathing is any better compared to yesterday. Denies any chest pain. No nausea, vomiting.   Objective: Vital Signs  Filed Vitals:   01/21/15 2349 01/21/15 2352 01/22/15 0315 01/22/15 0713  BP:  126/78 141/57 133/92  Pulse:  67 65 65  Temp: 97.6 F (36.4 C)  97.8 F (36.6 C) 98 F (36.7 C)  TempSrc: Oral  Oral Oral  Resp:  Height:      Weight:      SpO2:  93% 91% 95%    Intake/Output Summary (Last 24 hours) at 01/22/15 0720 Last data filed at 01/22/15 0600  Gross per 24 hour  Intake    720 ml  Output   2430 ml  Net  -1710 ml   Filed Weights   01/21/15 0000 01/21/15 1200  Weight: 156.4 kg (344 lb 12.8 oz) 156.491 kg (345 lb)    General appearance: alert, cooperative, appears stated age and morbidly obese Resp: Diminished air entry bilaterally due to body habitus. It appears to be slightly better compared to yesterday. Less crackles. No rhonchi, no wheezing.  Cardio: S1, S2 is irregularly irregular. No S3, S4. No rubs, murmurs, or bruit. Minimal pedal edema is noted. GI: Obese. Nontender. Bowel sounds present. No obvious hepatomegaly or splenomegaly. Body habitus limits examination. Extremities: Right leg with external fixation for recent ankle fracture. Left leg with erythema and skin peeling which is chronic per patient.  Neurologic: Awake, alert. Oriented 3. No focal neurological deficits.  Lab Results:  Basic Metabolic Panel:  Recent Labs Lab Jan 31, 2015 1900 01/21/15 0439 01/22/15 0238  NA 143 142 143  K 4.2 4.0 3.5  CL 91* 89* 89*  CO2 40* 45* 47*  GLUCOSE 162* 182* 125*  BUN CREATININE 0.62 0.57 0.59  CALCIUM 9.5 9.4 9.4  MG  --  1.6*  --   PHOS  --  3.3  --    CBC:  Recent Labs Lab 23-Jan-2015 1900 01/21/15 0439 01/22/15 0238  WBC 13.1* 9.6 11.6*  NEUTROABS 11.3*  --   --   HGB 10.2* 10.3* 9.9*  HCT 37.0 36.3 34.1*  MCV 95.9 93.6 91.7  PLT 354 352 369   BNP (last 3  results)  Recent Labs  11/27/14 0711 2015/01/23 1900  BNP 163.5* 178.8*    CBG:  Recent Labs Lab 01/21/15 1131 01/21/15 1617 01/21/15 2010 01/21/15 2342 01/22/15 0314  GLUCAP 166* 151* 156* 139* 124*    Recent Results (from the past 240 hour(s))  MRSA PCR Screening     Status: None   Collection Time: 2015/01/23 11:32 PM  Result Value Ref Range Status   MRSA by PCR NEGATIVE NEGATIVE Final    Comment:        The GeneXpert MRSA Assay (FDA approved for NASAL specimens only), is one component of a comprehensive MRSA colonization surveillance program. It is not intended to diagnose MRSA infection nor to guide or monitor treatment for MRSA infections.       Studies/Results: Dg Chest Port 1 View  January 23, 2015  CLINICAL DATA:  Shortness of breath for 1 day EXAM: PORTABLE CHEST 1 VIEW COMPARISON:  01/18/2015 FINDINGS: Cardiomediastinal silhouette is stable. Atherosclerotic calcifications of thoracic aorta again noted. There is worsening in aeration with interstitial prominence bilaterally. Findings consistent with worsening pulmonary edema. Superimposed alveolar infiltrates especially in right upper and lower lobe cannot be excluded. Clinical correlation is necessary. IMPRESSION: There is worsening in aeration with interstitial prominence bilaterally. Findings consistent with worsening pulmonary edema. Superimposed alveolar infiltrates especially in right upper and lower lobe cannot be excluded. Clinical correlation is necessary. Electronically Signed   By: Natasha Mead M.D.   On: 01/23/15 18:39    Medications:  Scheduled: . antiseptic oral rinse  7 mL Mouth Rinse BID  . clopidogrel  75 mg Oral Daily  . furosemide  40 mg Intravenous BID  . insulin aspart  0-9 Units Subcutaneous 6 times per day  . levalbuterol  0.63 mg Nebulization TID  . metoprolol tartrate  25 mg Oral BID  . mirabegron ER  50 mg Oral Daily  . pantoprazole (PROTONIX) IV  40 mg Intravenous Q24H  . potassium  chloride  20 mEq Oral Daily  . sodium chloride  3 mL Intravenous Q12H  . sodium chloride  3 mL Intravenous Q12H  . [START ON 01/23/2015] warfarin  2.5 mg Oral Q Mon-1800  . warfarin  5 mg Oral Once per day on Sun Tue Wed Thu Fri Sat  . Warfarin - Pharmacist Dosing Inpatient   Does not apply q1800   Continuous:  UEA:VWUJWJ chloride, sodium chloride, acetaminophen **OR** acetaminophen, HYDROmorphone (DILAUDID) injection, metoprolol, ondansetron **OR** ondansetron (ZOFRAN) IV, sodium chloride, sodium chloride  Assessment/Plan:  Active Problems:   Ankle fracture, right   Acute on chronic diastolic CHF (congestive heart failure) (HCC)   CHF exacerbation (HCC)   Acute respiratory failure with hypercapnia (HCC)   Pressure ulcer   Atrial fibrillation with RVR (HCC)   Obesity hypoventilation syndrome (HCC)    Acute respiratory failure with hypoxia and hypercapnia She appears to be a chronic CO2 retainer. Seen by pulmonology. BiPAP was needed briefly. Has not required it overnight.  She appears to have improved. Chest x-ray repeated this morning shows improvement in pulmonary edema.   Acute on chronic diastolic CHF Seems to be clinically improving. She appears to be diuresing well. Her weight has not been checked this morning. Continue Lasix for now. Chest x-ray shows improvement. Monitor ins and outs. Daily weights. Echocardiogram report from November was reviewed. No need to repeat one at this time. Dry weight is not known. She weighed 154.9 kg in October.  Atrial fibrillation with mild RVR Review of previous notes suggest that this is a new finding for her. Appears to be in sinus rhythm this morning. Continue current dose of beta blocker. Chads2VASC is at least 4. She is already on anticoagulation for history of PE. Continue for now.  Obesity hypoventilation syndrome Likely reason for her hypercapnia. Pulmonology has seen the patient. Currently stable. No longer requires BiPAP. Avoid  overcorrection. Avoid sedative medications.  Recent NSTEMi She was seen by cardiology during recent hospitalization. She was started on Plavix. She has been managed medically. Due to her morbid obesity, stress test will be of low sensitivity. Continue beta blockers. Resume statin as soon as possible.  History of recurrent PEs Continue warfarin per pharmacy.  Recent ORIF to right ankle Stable. To be followed by orthopedics in the next week or so. She is nonweightbearing on the right lower extremity.  Normocytic anemia Continue to monitor hemoglobin. No evidence for overt bleeding.   DVT Prophylaxis: On warfarin    Code Status: Full code  Family Communication: Discussed with the patient  Disposition Plan: She has remained stable. Some improvement noted. Okay for transfer to telemetry floor. Anticipate discharge in 24-48 hours. She will return to SNF.    LOS: 2 days   [facility] Center[facility]ISHNAN,Italo Banton  [facility] Pager [phone] 01/22/2015, 7:20 AM  If 7PM-7AM, please contact night-coverage at [email or web address], password [email or web address]

## 2015-01-22 NOTE — Progress Notes (Signed)
ANTICOAGULATION CONSULT NOTE   Pharmacy Consult for Warfarin Indication: atrial fibrillation  Allergies  Allergen Reactions  . Talwin [Pentazocine] Other (See Comments)    Extreme hot feeling  . Fentanyl Other (See Comments)    Fainted?  . Latex Itching    Patient Measurements: Height: 5\' 2"  (157.5 cm) Weight:  (pt bed is not providing accurate weight ) IBW/kg (Calculated) : 50.1   Vital Signs: Temp: 98 F (36.7 C) (12/25 0713) Temp Source: Oral (12/25 0713) BP: 140/73 mmHg (12/25 0827) Pulse Rate: 67 (12/25 0827)  Labs:  Recent Labs  01/08/2015 1900 01/21/15 0439 01/22/15 0238  HGB 10.2* 10.3* 9.9*  HCT 37.0 36.3 34.1*  PLT 354 352 369  LABPROT 29.9*  --  34.8*  INR 2.91*  --  3.56*  CREATININE 0.62 0.57 0.59    Estimated Creatinine Clearance: 97.1 mL/min (by C-G formula based on Cr of 0.59).   Medical History: Past Medical History  Diagnosis Date  . Ankle fracture, right 11/27/2014  . Dislocation of right ankle joint 11/27/2014  . Shortness of breath dyspnea   . Pneumonia   . Diabetes mellitus without complication (HCC)   . Dementia   . Anxiety   . Hypertension   . CHF (congestive heart failure) (HCC)     Diastolic  . Obesity   . COPD (chronic obstructive pulmonary disease) (HCC)     Home O2 4L nasal cannula      Assessment: 69[facility], O2 sats at the facility were reportedly 60's placed on BiPap and given IV furosemide.   PMH: COPD, Diastolic CHF, HTN, and DM2, and ORIF of right Ankle Fx 10/16 Patient with Afib RVR 130s, BP stable 130/70, Cr stable 0.6, INR on adit 2.9 - last warfarin dose PTA 12/22  INR today elevated at 3.56  Warfarin dose PTA 2.5mg  Mon and 5mg  all other days  Goal of Therapy:  INR 2-3 Monitor platelets by anticoagulation protocol: Yes   Plan:  Hold Coumadin today Protime daily  Thank you Okey RegalLisa Abdias Hickam, PharmD [phone]  01/22/2015 9:23 AM

## 2015-01-23 DIAGNOSIS — J9602 Acute respiratory failure with hypercapnia: Secondary | ICD-10-CM

## 2015-01-23 DIAGNOSIS — I480 Paroxysmal atrial fibrillation: Secondary | ICD-10-CM | POA: Insufficient documentation

## 2015-01-23 LAB — CBC
HCT: 36.9 % (ref 36.0–46.0)
Hemoglobin: 10.3 g/dL — ABNORMAL LOW (ref 12.0–15.0)
MCH: 25.9 pg — ABNORMAL LOW (ref 26.0–34.0)
MCHC: 27.9 g/dL — ABNORMAL LOW (ref 30.0–36.0)
MCV: 92.9 fL (ref 78.0–100.0)
PLATELETS: 321 10*3/uL (ref 150–400)
RBC: 3.97 MIL/uL (ref 3.87–5.11)
RDW: 17.3 % — ABNORMAL HIGH (ref 11.5–15.5)
WBC: 9 10*3/uL (ref 4.0–10.5)

## 2015-01-23 LAB — GLUCOSE, CAPILLARY
GLUCOSE-CAPILLARY: 130 mg/dL — AB (ref 65–99)
Glucose-Capillary: 153 mg/dL — ABNORMAL HIGH (ref 65–99)
Glucose-Capillary: 119 mg/dL — ABNORMAL HIGH (ref 65–99)
Glucose-Capillary: 164 mg/dL — ABNORMAL HIGH (ref 65–99)

## 2015-01-23 LAB — BASIC METABOLIC PANEL
ANION GAP: 11 (ref 5–15)
BUN: 14 mg/dL (ref 6–20)
CO2: 46 mmol/L — ABNORMAL HIGH (ref 22–32)
Calcium: 9.6 mg/dL (ref 8.9–10.3)
Chloride: 87 mmol/L — ABNORMAL LOW (ref 101–111)
Creatinine, Ser: 0.58 mg/dL (ref 0.44–1.00)
GFR calc Af Amer: >60 mL/min (ref >60)
GLUCOSE: 160 mg/dL — AB (ref 65–99)
POTASSIUM: 3.7 mmol/L (ref 3.5–5.1)
SODIUM: 144 mmol/L (ref 135–145)

## 2015-01-23 LAB — PROTIME-INR
INR: 2.72 — ABNORMAL HIGH (ref 0.00–1.49)
Prothrombin Time: 28.4 seconds — ABNORMAL HIGH (ref 11.6–15.2)

## 2015-01-23 MED ORDER — FUROSEMIDE 10 MG/ML IJ SOLN
40.0000 mg | Freq: Two times a day (BID) | INTRAMUSCULAR | Status: DC
  Administered 2015-01-23 – 2015-01-24 (×3): 40 mg via INTRAVENOUS
  Filled 2015-01-23 (×3): qty 4

## 2015-01-23 MED ORDER — HYDROCERIN EX CREA
TOPICAL_CREAM | Freq: Every day | CUTANEOUS | Status: DC
  Administered 2015-01-24 – 2015-02-02 (×8): via TOPICAL
  Filled 2015-01-23 (×2): qty 113

## 2015-01-23 MED ORDER — HYDROCERIN EX CREA
TOPICAL_CREAM | Freq: Two times a day (BID) | CUTANEOUS | Status: DC
  Filled 2015-01-23: qty 113

## 2015-01-23 MED ORDER — WARFARIN SODIUM 2.5 MG PO TABS
2.5000 mg | ORAL_TABLET | Freq: Once | ORAL | Status: AC
  Administered 2015-01-23: 2.5 mg via ORAL
  Filled 2015-01-23: qty 1

## 2015-01-23 NOTE — Progress Notes (Signed)
[facility] PROGRESS NOTE  [name] ZOX:[id number] DOB: 12-Aug-1945 DOA: 01/01/2015  PCP: No primary care provider on file.  Brief HPI: 69 year old Caucasian female with a past medical history of morbid obesity, COPD, diastolic CHF, hypertension, who was recently hospitalized for a fracture of the right ankle status post ORIF and is currently in a skilled nursing facility. She presented to the emergency department with complaints of worsening shortness of breath. She was noted to be hypoxic into the 60s and 70s. X-ray showed pulmonary edema. She was hospitalized for further management.  Past medical history:  Past Medical History  Diagnosis Date  . Ankle fracture, right 11/27/2014  . Dislocation of right ankle joint 11/27/2014  . Shortness of breath dyspnea   . Pneumonia   . Diabetes mellitus without complication (HCC)   . Dementia   . Anxiety   . Hypertension   . CHF (congestive heart failure) (HCC)     Diastolic  . Obesity   . COPD (chronic obstructive pulmonary disease) (HCC)     Home O2 4L nasal cannula    Consultants: Pulmonology  Procedures:  Echocardiogram 11/29/14 Study Conclusions - Left ventricle: The cavity size was normal. Wall thickness wasnormal. Systolic function was normal. The estimated ejectionfraction was in the range of 50% to 55%. Wall motion was normal;there were no regional wall motion abnormalities. Features areconsistent with a pseudonormal left ventricular filling pattern,with concomitant abnormal relaxation and increased fillingpressure (grade 2 diastolic dysfunction). - Aortic valve: There was trivial regurgitation. - Right ventricle: The cavity size was mildly dilated. Systolicfunction was mildly to moderately reduced. - Tricuspid valve: There was mild-moderate regurgitation. - Pulmonary arteries: Systolic pressure was moderately increased.PA peak pressure: 50 mm Hg (S).  Antibiotics: None  Subjective: Patient feels better  this morning. Short of breath. Very keen on going back to her SNF tomorrow. She denies any chest pain or nausea, vomiting.    Objective: Vital Signs  Filed Vitals:   01/22/15 1934 01/22/15 1955 01/23/15 0527 01/23/15 0744  BP:  126/68 126/67 126/63  Pulse:  69 58 61  Temp:  98.6 F (37 C) 97.9 F (36.6 C) 97.5 F (36.4 C)  TempSrc:  Oral Oral Oral  Resp:  Height:      Weight:   153.316 kg (338 lb)   SpO2: 96% 94% 95% 96%    Intake/Output Summary (Last 24 hours) at 01/23/15 0811 Last data filed at 01/23/15 0650  Gross per 24 hour  Intake    420 ml  Output   4426 ml  Net  -4006 ml   Filed Weights   01/21/15 0000 01/21/15 1200 01/23/15 0527  Weight: 156.4 kg (344 lb 12.8 oz) 156.491 kg (345 lb) 153.316 kg (338 lb)    General appearance: alert, cooperative, appears stated age and morbidly obese Resp: Improved air entry bilaterally. Less crackles compared to yesterday. No wheezing or rhonchi.  Cardio: S1, S2 is irregularly irregular. No S3, S4. No rubs, murmurs, or bruit. Minimal pedal edema is noted. GI: Obese. Nontender. Bowel sounds present. No obvious hepatomegaly or splenomegaly. Body habitus limits examination. Extremities: Right leg with external fixation for recent ankle fracture. Left leg with erythema and skin peeling which is chronic per patient.  Neurologic: Awake, alert. Oriented 3. No focal neurological deficits.  Lab Results:  Basic Metabolic Panel:  Recent Labs Lab 01/27/2015 1900 01/21/15 0439 01/22/15 0238 01/23/15 0618  NA 143 142 143 144  K 4.2 4.0 3.5 3.7  CL  91* 89* 89* 87*  CO2 40* 45* 47* 46*  GLUCOSE 162* 182* 125* 160*  BUN 19 17 16 14   CREATININE 0.62 0.57 0.59 0.58  CALCIUM 9.5 9.4 9.4 9.6  MG  --  1.6*  --   --   PHOS  --  3.3  --   --    CBC:  Recent Labs Lab 01/10/2015 1900 01/21/15 0439 01/22/15 0238 01/23/15 0618  WBC 13.1* 9.6 11.6* 9.0  NEUTROABS 11.3*  --   --   --   HGB 10.2* 10.3* 9.9* 10.3*  HCT 37.0 36.3  34.1* 36.9  MCV 95.9 93.6 91.7 92.9  PLT 354 352 369 321   BNP (last 3 results)  Recent Labs  11/27/14 0711 12/29/2014 1900  BNP 163.5* 178.8*    CBG:  Recent Labs Lab 01/22/15 0712 01/22/15 1234 01/22/15 1629 01/22/15 2212 01/23/15 0604  GLUCAP 119* 157* 151* 162* 153*    Recent Results (from the past 240 hour(s))  MRSA PCR Screening     Status: None   Collection Time: 01/08/2015 11:32 PM  Result Value Ref Range Status   MRSA by PCR NEGATIVE NEGATIVE Final    Comment:        The GeneXpert MRSA Assay (FDA approved for NASAL specimens only), is one component of a comprehensive MRSA colonization surveillance program. It is not intended to diagnose MRSA infection nor to guide or monitor treatment for MRSA infections.       Studies/Results: Dg Chest Port 1 View  01/22/2015  CLINICAL DATA:  Congestive heart failure, pneumonia EXAM: PORTABLE CHEST 1 VIEW COMPARISON:  01/16/2015 FINDINGS: Cardiomegaly again noted. Persistent mild interstitial prominence bilaterally consistent with improving pulmonary edema. Persistent right infrahilar mild hazy airspace opacity. Superimposed infiltrate cannot be excluded. Atherosclerotic calcifications of thoracic aorta again noted. IMPRESSION: Persistent mild interstitial prominence bilaterally consistent with improving pulmonary edema. Persistent right infrahilar mild hazy airspace opacity. Superimposed infiltrate cannot be excluded. Clinical correlation is necessary. Electronically Signed   By: Natasha MeadLiviu  Pop M.D.   On: 01/22/2015 10:09    Medications:  Scheduled: . antiseptic oral rinse  7 mL Mouth Rinse BID  . clopidogrel  75 mg Oral Daily  . furosemide  40 mg Intravenous Q12H  . insulin aspart  0-9 Units Subcutaneous TID WC & HS  . levalbuterol  0.63 mg Nebulization TID  . metoprolol tartrate  25 mg Oral BID  . mirabegron ER  50 mg Oral Daily  . pantoprazole  40 mg Oral Q1200  . potassium chloride  20 mEq Oral Daily  . sodium  chloride  3 mL Intravenous Q12H  . sodium chloride  3 mL Intravenous Q12H  . Warfarin - Pharmacist Dosing Inpatient   Does not apply q1800   Continuous:  ZOX:WRUEAVPRN:sodium chloride, sodium chloride, acetaminophen **OR** acetaminophen, HYDROmorphone (DILAUDID) injection, metoprolol, ondansetron **OR** ondansetron (ZOFRAN) IV, sodium chloride, sodium chloride  Assessment/Plan:  Active Problems:   Ankle fracture, right   Acute on chronic diastolic CHF (congestive heart failure) (HCC)   CHF exacerbation (HCC)   Acute respiratory failure with hypercapnia (HCC)   Pressure ulcer   Atrial fibrillation with RVR (HCC)   Obesity hypoventilation syndrome (HCC)    Acute respiratory failure with hypoxia and hypercapnia She is much improved. She appears to be a chronic CO2 retainer. Seen by pulmonology. BiPAP was needed briefly. Will benefit from outpatient sleep study.   Acute on chronic diastolic CHF Seems to be clinically improving. She appears to be diuresing well. Weight  has decreased. Continue Lasix intravenously for one more day. Repeat chest x-ray tomorrow morning. Monitor ins and outs. Daily weights. Echocardiogram report from November was reviewed. No need to repeat one at this time. Dry weight is not known. She weighed 154.9 kg in October.  Atrial fibrillation with mild RVR Review of previous notes suggest that this is a new finding for her. Appears to be in sinus rhythm this morning. Continue current dose of beta blocker. Chads2VASC is at least 4. She is already on anticoagulation for history of PE. Continue for now.  Obesity hypoventilation syndrome Likely reason for her hypercapnia. Pulmonology has seen the patient. Currently stable. No longer requires BiPAP. Avoid overcorrection. Avoid sedative medications. Outpatient sleep study.  Recent NSTEMi She was seen by cardiology during recent hospitalization. She was started on Plavix. She has been managed medically. Due to her morbid obesity,  stress test will be of low sensitivity. Continue beta blockers. Resume statin at discharge.   History of recurrent PEs Continue warfarin per pharmacy.  Recent ORIF to right ankle Stable. To be followed by orthopedics in the next week or so. She is nonweightbearing on the right lower extremity.  Normocytic anemia Continue to monitor hemoglobin. No evidence for overt bleeding.   DVT Prophylaxis: On warfarin    Code Status: Full code  Family Communication: Discussed with the patient  Disposition Plan: Patient is improving. Repeat chest x-ray tomorrow morning. Anticipate discharge back to SNF tomorrow.     LOS: 3 days   [facility]  [facility] Pager [phone] 01/23/2015, 8:11 AM  If 7PM-7AM, please contact night-coverage at [email or web address], password [facility]

## 2015-01-23 NOTE — Progress Notes (Signed)
ANTICOAGULATION CONSULT NOTE   Pharmacy Consult for Warfarin Indication: atrial fibrillation  Allergies  Allergen Reactions  . Talwin [Pentazocine] Other (See Comments)    Extreme hot feeling  . Fentanyl Other (See Comments)    Fainted?  . Latex Itching    Patient Measurements: Height: 5\' 2"  (157.5 cm) Weight: (!) 338 lb (153.316 kg) (bed scale; pt can't stand) IBW/kg (Calculated) : 50.1   Vital Signs: Temp: 97.5 F (36.4 C) (12/26 0744) Temp Source: Oral (12/26 0744) BP: 126/63 mmHg (12/26 0744) Pulse Rate: 61 (12/26 0744)  Labs:  Recent Labs  04-07-14 1900 01/21/15 0439 01/22/15 0238 01/23/15 0618  HGB 10.2* 10.3* 9.9* 10.3*  HCT 37.0 36.3 34.1* 36.9  PLT 354 352 369 321  LABPROT 29.9*  --  34.8* 28.4*  INR 2.91*  --  3.56* 2.72*  CREATININE 0.62 0.57 0.59 0.58    Estimated Creatinine Clearance: 95.8 mL/min (by C-G formula based on Cr of 0.58).   Medical History: Past Medical History  Diagnosis Date  . Ankle fracture, right 11/27/2014  . Dislocation of right ankle joint 11/27/2014  . Shortness of breath dyspnea   . Pneumonia   . Diabetes mellitus without complication (HCC)   . Dementia   . Anxiety   . Hypertension   . CHF (congestive heart failure) (HCC)     Diastolic  . Obesity   . COPD (chronic obstructive pulmonary disease) (HCC)     Home O2 4L nasal cannula      Assessment: 69[facility] SOB, O2 sats at the facility were reportedly 60's placed on BiPap and given IV furosemide.    PMH: COPD, Diastolic CHF, HTN, and DM2, and ORIF of right Ankle Fx 10/16 Patient with Afib RVR 130s, BP stable 130/70, Cr stable 0.58, INR on admit 2.9 - last warfarin dose PTA 12/22  INR elevated yesterday at 3.56 after resumption of home dose. Dose held and INR back down to 2.72 today. Hgb 10.3, Plt 321, no reports of bleeding  Warfarin dose PTA 2.5mg  Mon and 5mg  all other days  Goal of Therapy:  INR  2-3 Monitor platelets by anticoagulation protocol: Yes   Plan:  Resume home warfarin dose today (2.5 mg x1) CBC q72h, Daily INR Monitor for s/sx bleeding May need dose change at discharge if pt continues to be supratherapeutic  Arcola JanskyMeagan Raynesha Tiedt, PharmD Clinical Pharmacy Resident Pager: [phone]  01/23/2015 10:04 AM

## 2015-01-23 NOTE — Discharge Instructions (Addendum)

## 2015-01-23 NOTE — Progress Notes (Signed)
PCCM PROGRESS NOTE  Subjective: Denies chest pain, dyspnea.  Objective: BP 132/57 mmHg  Pulse 60  Temp(Src) 97.6 F (36.4 C) (Oral)  Resp 18  Ht 5\' 2"  (1.575 m)  Wt 338 lb (153.316 kg)  BMI 61.81 kg/m2  SpO2 95%  I/O last 3 completed shifts: In: 720 [P.O.:720] Out: 5741 [Urine:5740; Stool:1]  General: pleasant Neuro: alert, follows commands Cardiac: regular Chest: no wheeze Abd: soft, non tender Ext: Lt leg edema, Rt leg in external fixator   CBC Recent Labs     01/21/15  0439  01/22/15  0238  01/23/15  0618  WBC  9.6  11.6*  9.0  HGB  10.3*  9.9*  10.3*  HCT  36.3  34.1*  36.9  PLT  352  369  321    Coag's Recent Labs     2014/12/26  1900  01/22/15  0238  01/23/15  0618  INR  2.91*  3.56*  2.72*    BMET Recent Labs     01/21/15  0439  01/22/15  0238  01/23/15  0618  NA  142  143  144  K  4.0  3.5  3.7  CL  89*  89*  87*  CO2  45*  47*  46*  BUN  17  16  14   CREATININE  0.57  0.59  0.58  GLUCOSE  182*  125*  160*    Electrolytes Recent Labs     01/21/15  0439  01/22/15  0238  01/23/15  0618  CALCIUM  9.4  9.4  9.6  MG  1.6*   --    --   PHOS  3.3   --    --     ABG Recent Labs     2014/12/26  1831  2014/12/26  2105  01/21/15  0100  PHART  7.312*  7.404  7.424  PCO2ART  94.9*  79.6*  80.0*  PO2ART  159.0*  51.0*  56.0*    Glucose Recent Labs     01/22/15  0712  01/22/15  1234  01/22/15  1629  01/22/15  2212  01/23/15  0604  01/23/15  1148  GLUCAP  119*  157*  151*  162*  153*  119*    Imaging Dg Chest Port 1 View  01/22/2015  CLINICAL DATA:  Congestive heart failure, pneumonia EXAM: PORTABLE CHEST 1 VIEW COMPARISON:  04-Feb-2014 FINDINGS: Cardiomegaly again noted. Persistent mild interstitial prominence bilaterally consistent with improving pulmonary edema. Persistent right infrahilar mild hazy airspace opacity. Superimposed infiltrate cannot be excluded. Atherosclerotic calcifications of thoracic aorta again noted.  IMPRESSION: Persistent mild interstitial prominence bilaterally consistent with improving pulmonary edema. Persistent right infrahilar mild hazy airspace opacity. Superimposed infiltrate cannot be excluded. Clinical correlation is necessary. Electronically Signed   By: Natasha MeadLiviu  Pop M.D.   On: 01/22/2015 10:09   Assessment/Plan:  Acute on chronic hypoxic/hypercapnic respiratory failure 2nd to COPD, OHS, Diastolic CHF after recent ORIF for Rt ankle fx. Plan: - oxygen to keep SpO2 90 to 95% - consider outpt sleep study to assess sleep disordered breathing further - weight loss - diuresis per primary team - continue BD's as needed  A fib with RVR. Hx of recurrent PE's. Plan: - per primary team  PCCM will sign off.  Please call if additional help needed while she is in hospital.  Coralyn HellingVineet Terrall Bley, MD [facility] 01/23/2015, 1:05 PM Pager:  [phone] After 3pm call: [phone]

## 2015-01-23 NOTE — Consult Note (Addendum)
WOC wound consult note Reason for Consult: Consult requested for lower extremities.   Wound type: Left leg with dry scaly skin, no open wounds; appearance consistent with venous stasis changes. Right leg with immobilizer in place with ace wrap and foot splint. Pt has been followed by ortho service as an outpatient for recent fracture according to the EMR.  Removed ace wrap and splint to assess RLE; all pin sites for leg immobilizer without erythremia, edema, or drainage.  No other open wounds to RLE.   Right heel woith dark purple deep tissue injury; 1X1cm, present on admission.  No topical treatment indicated at this time; reduce pressure by floating heel. Dressing procedure/placement/frequency: Eucerin cream to assist with removal of nonviable skin to left leg.  Reapplied kerlex to RLE with acewrap over the top and foot splint in place.  Pt can resume follow-up with ortho service after discharge.  Float heel to reduce pressure.  Please consult ortho service for further questions regarding plan of care. Please re-consult if further assistance is needed.  Thank-you,  Cammie Mcgeeawn Flordia Kassem MSN, RN, CWOCN, [place]CN-AP, CNS [phone]

## 2015-01-24 ENCOUNTER — Inpatient Hospital Stay (HOSPITAL_COMMUNITY): Payer: Medicare Other

## 2015-01-24 DIAGNOSIS — I5033 Acute on chronic diastolic (congestive) heart failure: Secondary | ICD-10-CM

## 2015-01-24 DIAGNOSIS — E662 Morbid (severe) obesity with alveolar hypoventilation: Secondary | ICD-10-CM

## 2015-01-24 DIAGNOSIS — I4891 Unspecified atrial fibrillation: Secondary | ICD-10-CM

## 2015-01-24 DIAGNOSIS — D649 Anemia, unspecified: Secondary | ICD-10-CM

## 2015-01-24 LAB — BASIC METABOLIC PANEL
ANION GAP: 5 (ref 5–15)
BUN: 15 mg/dL (ref 6–20)
CALCIUM: 9.5 mg/dL (ref 8.9–10.3)
CO2: 50 mmol/L — AB (ref 22–32)
CREATININE: 0.59 mg/dL (ref 0.44–1.00)
Chloride: 88 mmol/L — ABNORMAL LOW (ref 101–111)
Glucose, Bld: 154 mg/dL — ABNORMAL HIGH (ref 65–99)
Potassium: 3.8 mmol/L (ref 3.5–5.1)
SODIUM: 143 mmol/L (ref 135–145)

## 2015-01-24 LAB — HEMOGLOBIN A1C
HEMOGLOBIN A1C: 6.1 % — AB (ref 4.8–5.6)
Mean Plasma Glucose: 128 mg/dL

## 2015-01-24 LAB — GLUCOSE, CAPILLARY
GLUCOSE-CAPILLARY: 127 mg/dL — AB (ref 65–99)
GLUCOSE-CAPILLARY: 137 mg/dL — AB (ref 65–99)
GLUCOSE-CAPILLARY: 151 mg/dL — AB (ref 65–99)
GLUCOSE-CAPILLARY: 146 mg/dL — AB (ref 65–99)
GLUCOSE-CAPILLARY: 144 mg/dL — AB (ref 65–99)

## 2015-01-24 LAB — PROTIME-INR
INR: 2.69 — AB (ref 0.00–1.49)
PROTHROMBIN TIME: 28.2 s — AB (ref 11.6–15.2)

## 2015-01-24 MED ORDER — FUROSEMIDE 10 MG/ML IJ SOLN
80.0000 mg | Freq: Two times a day (BID) | INTRAMUSCULAR | Status: AC
  Administered 2015-01-24: 80 mg via INTRAVENOUS
  Filled 2015-01-24: qty 8

## 2015-01-24 MED ORDER — WARFARIN SODIUM 5 MG PO TABS
5.0000 mg | ORAL_TABLET | Freq: Once | ORAL | Status: AC
Start: 2015-01-24 — End: 2015-01-24
  Administered 2015-01-24: 5 mg via ORAL
  Filled 2015-01-24: qty 1

## 2015-01-24 NOTE — Evaluation (Signed)
Occupational Therapy Evaluation Patient Details Name: KALEY JUTRAS MRN: [id number] DOB: [birth year] Today's Date: 01/24/2015    History of Present Illness 69 yo female admitted from SNF [facility]) with acute on chronic hypoxic / hypercapnic respiratory failure. PMH: COPD, CHF, HTN, DM2, ORIF R ankle fx with external fixator   Clinical Impression   Patient evaluated by Occupational Therapy with no further acute OT needs identified. All education has been completed and the patient has no further questions. See below for any follow-up Occupational Therapy or equipment needs. OT to defer all further treatment to SNF level . OT to sign off. Thank you for referral.      Follow Up Recommendations  SNF;Supervision/Assistance - 24 hour    Equipment Recommendations  Hospital bed;Wheelchair cushion (measurements OT);Wheelchair (measurements OT);Other (comment) (bariatric mattress overlay)    Recommendations for Other Services       Precautions / Restrictions Precautions Precaution Comments: R LE external fixator and foot plate splint fabricated 11/2014 by Skeet Simmer Restrictions RLE Weight Bearing: Non weight bearing      Mobility Bed Mobility Overal bed mobility: +2 for physical assistance;+ 2 for safety/equipment             General bed mobility comments: total (A) and will need maxisky   Transfers                      Balance                                            ADL Overall ADL's : Needs assistance/impaired                                       General ADL Comments: total (A) for all adls. pt reports "they do it for me" pt transfered from old mattress overlay to new mattress overlay. Pt total +5 (A) to slide to new bed due to patient reports inability to (A)     Vision     Perception     Praxis      Pertinent Vitals/Pain Pain Assessment: No/denies pain     Hand Dominance Right   Extremity/Trunk  Assessment Upper Extremity Assessment Upper Extremity Assessment: Generalized weakness   Lower Extremity Assessment Lower Extremity Assessment: RLE deficits/detail RLE Deficits / Details: external fixator and foot plate splint. Splint provided foam to decr sliding on ace wrap. pt tolerating well and better positioning. RN staff educated on splint       Communication Communication Communication: No difficulties   Cognition Arousal/Alertness: Awake/alert Behavior During Therapy: WFL for tasks assessed/performed Overall Cognitive Status: Within Functional Limits for tasks assessed                     General Comments       Exercises       Shoulder Instructions      Home Living Family/patient expects to be discharged to:: Skilled nursing facility                                        Prior Functioning/Environment          Comments: total (A) for all adls and  hoyer lift only per patient. Pt reports hoyer lift to w/c and doing exercises in w/c at facility    OT Diagnosis: Generalized weakness   OT Problem List:     OT Treatment/Interventions:      OT Goals(Current goals can be found in the care plan section) Acute Rehab OT Goals OT Goal Formulation: With patient  OT Frequency:     Barriers to D/C:            Co-evaluation              End of Session Nurse Communication: Need for lift equipment  Activity Tolerance: Patient tolerated treatment well Patient left: Other (comment)   Time: 0940-1001 OT Time Calculation (min): 21 min Charges:  OT General Charges $OT Visit: 1 Procedure OT Evaluation $Initial OT Evaluation Tier I: 1 Procedure G-Codes:    Harolyn RutherfordJones, Rashaun Curl B 01/24/2015, 10:38 AM  Mateo FlowJones, Brynn   OTR/L Pager: [phone] Office: [phone] .

## 2015-01-24 NOTE — Progress Notes (Signed)
[facility] PROGRESS NOTE  Jaclyn ShaggyLinda C Mcquaid ZOX:[id number] DOB: 01-09-1946 DOA: 11/13/14  PCP: No primary care provider on file.  Brief HPI: 69 year old Caucasian female with a past medical history of morbid obesity, COPD, diastolic CHF, hypertension, who was recently hospitalized for a fracture of the right ankle status post ORIF and is currently in a skilled nursing facility. She presented to the emergency department with complaints of worsening shortness of breath. She was noted to be hypoxic into the 60s and 70s. X-ray showed pulmonary edema. She was hospitalized for further management. Despite treatment with IV Lasix. Patient continues to be dyspneic. Chest x-ray shows no improvement. Heart failure team consulted today.  Past medical history:  Past Medical History  Diagnosis Date  . Ankle fracture, right 11/27/2014  . Dislocation of right ankle joint 11/27/2014  . Shortness of breath dyspnea   . Pneumonia   . Diabetes mellitus without complication (HCC)   . Dementia   . Anxiety   . Hypertension   . CHF (congestive heart failure) (HCC)     Diastolic  . Obesity   . COPD (chronic obstructive pulmonary disease) (HCC)     Home O2 4L nasal cannula    Consultants: Pulmonology. Heart failure team  Procedures:  Echocardiogram 11/29/14 Study Conclusions - Left ventricle: The cavity size was normal. Wall thickness wasnormal. Systolic function was normal. The estimated ejectionfraction was in the range of 50% to 55%. Wall motion was normal;there were no regional wall motion abnormalities. Features areconsistent with a pseudonormal left ventricular filling pattern,with concomitant abnormal relaxation and increased fillingpressure (grade 2 diastolic dysfunction). - Aortic valve: There was trivial regurgitation. - Right ventricle: The cavity size was mildly dilated. Systolicfunction was mildly to moderately reduced. - Tricuspid valve: There was mild-moderate regurgitation. -  Pulmonary arteries: Systolic pressure was moderately increased.PA peak pressure: 50 mm Hg (S).  Antibiotics: None  Subjective: Patient continues to feel short of breath. Disappointed that she will not be able to go back to her nursing facility today. She denies any chest pain, nausea or vomiting.    Objective: Vital Signs  Filed Vitals:   01/23/15 1947 01/23/15 2014 01/24/15 0515 01/24/15 0757  BP: 130/61  127/61   Pulse: 65  60   Temp: 97.9 F (36.6 C)  97.7 F (36.5 C)   TempSrc: Oral  Oral   Resp: 20  22   Height:      Weight:      SpO2: 92% 91% 92% 94%    Intake/Output Summary (Last 24 hours) at 01/24/15 0821 Last data filed at 01/24/15 0030  Gross per 24 hour  Intake    760 ml  Output   2500 ml  Net  -1740 ml   Filed Weights   01/21/15 0000 01/21/15 1200 01/23/15 0527  Weight: 156.4 kg (344 lb 12.8 oz) 156.491 kg (345 lb) 153.316 kg (338 lb)    General appearance: alert, cooperative, appears stated age and morbidly obese Resp: Chest x-ray reveals bilateral wheezing and crackles.  Cardio: S1, S2 is irregularly irregular. No S3, S4. No rubs, murmurs, or bruit. Minimal pedal edema is noted. GI: Obese. Nontender. Bowel sounds present. No obvious hepatomegaly or splenomegaly. Body habitus limits examination. Extremities: Right leg with external fixation for recent ankle fracture. Left leg with erythema and skin peeling which is chronic per patient.  Neurologic: Awake, alert. Oriented 3. No focal neurological deficits.  Lab Results:  Basic Metabolic Panel:  Recent Labs Lab 2014/05/23 1900 01/21/15 0439 01/22/15 [id number]  01/23/15 0618 01/24/15 0543  NA 143 142 143 144 143  K 4.2 4.0 3.5 3.7 3.8  CL 91* 89* 89* 87* 88*  CO2 40* 45* 47* 46* 50*  GLUCOSE 162* 182* 125* 160* 154*  BUN CREATININE 0.62 0.57 0.59 0.58 0.59  CALCIUM 9.5 9.4 9.4 9.6 9.5  MG  --  1.6*  --   --   --   PHOS  --  3.3  --   --   --    CBC:  Recent Labs Lab  01/02/2015 1900 01/21/15 0439 01/22/15 0238 01/23/15 0618  WBC 13.1* 9.6 11.6* 9.0  NEUTROABS 11.3*  --   --   --   HGB 10.2* 10.3* 9.9* 10.3*  HCT 37.0 36.3 34.1* 36.9  MCV 95.9 93.6 91.7 92.9  PLT 354 352 369 321   BNP (last 3 results)  Recent Labs  11/27/14 0711 01/04/2015 1900  BNP 163.5* 178.8*    CBG:  Recent Labs Lab 01/23/15 0604 01/23/15 1148 01/23/15 1626 01/23/15 2118 01/24/15 0605  GLUCAP 153* 119* 130* 164* 127*    Recent Results (from the past 240 hour(s))  MRSA PCR Screening     Status: None   Collection Time: 01/18/2015 11:32 PM  Result Value Ref Range Status   MRSA by PCR NEGATIVE NEGATIVE Final    Comment:        The GeneXpert MRSA Assay (FDA approved for NASAL specimens only), is one component of a comprehensive MRSA colonization surveillance program. It is not intended to diagnose MRSA infection nor to guide or monitor treatment for MRSA infections.       Studies/Results: Dg Chest Port 1 View  01/24/2015  CLINICAL DATA:  69 year old female with congestive heart failure EXAM: PORTABLE CHEST 1 VIEW COMPARISON:  Prior chest x-ray 01/22/2015 FINDINGS: Unchanged cardiomegaly. Atherosclerotic calcifications again noted in the transverse aorta. Interval worsening of aeration with increased bilateral interstitial and airspace opacities. Findings are concerning for worsening pulmonary edema. Small bilateral layering effusions. Associated left basilar opacity favored to reflect atelectasis. No acute osseous abnormality. IMPRESSION: Interval progression of pulmonary edema with worsening aeration bilaterally. Probable small bilateral pleural effusions with associated atelectasis versus infiltrate. Atelectasis is favored. Electronically Signed   By: Malachy Moan M.D.   On: 01/24/2015 07:25    Medications:  Scheduled: . antiseptic oral rinse  7 mL Mouth Rinse BID  . clopidogrel  75 mg Oral Daily  . furosemide  40 mg Intravenous Q12H  . hydrocerin    Topical Daily  . insulin aspart  0-9 Units Subcutaneous TID WC & HS  . levalbuterol  0.63 mg Nebulization TID  . metoprolol tartrate  25 mg Oral BID  . mirabegron ER  50 mg Oral Daily  . pantoprazole  40 mg Oral Q1200  . potassium chloride  20 mEq Oral Daily  . sodium chloride  3 mL Intravenous Q12H  . sodium chloride  3 mL Intravenous Q12H  . Warfarin - Pharmacist Dosing Inpatient   Does not apply q1800   Continuous:  ZDG:UYQIHK chloride, sodium chloride, acetaminophen **OR** acetaminophen, HYDROmorphone (DILAUDID) injection, metoprolol, ondansetron **OR** ondansetron (ZOFRAN) IV, sodium chloride, sodium chloride  Assessment/Plan:  Active Problems:   Ankle fracture, right   Acute on chronic diastolic CHF (congestive heart failure) (HCC)   CHF exacerbation (HCC)   Acute respiratory failure with hypercapnia (HCC)   Pressure ulcer   Atrial fibrillation with RVR (HCC)   Obesity hypoventilation syndrome (HCC)  Paroxysmal atrial fibrillation (HCC)    Acute respiratory failure with hypoxia and hypercapnia It appears that patient has had a setback. Chest x-ray from this morning reveals worsening in her pulmonary edema. She also appears to be a chronic CO2 retainer. Seen by pulmonology. BiPAP was needed briefly. Will benefit from outpatient sleep study.   Acute on chronic diastolic CHF Patient is experiencing a setback. Weight appears to have gone up this morning for reasons that are not clear. She remains on twice a day dose of Lasix. Consult heart failure team to assist with management. Monitor ins and outs. Daily weights. Echocardiogram report from November was reviewed. In view of her worsening status, we may need to replete echocardiogram. Will defer to cardiology for now. Dry weight is not known. She weighed 154.9 kg in October.  Atrial fibrillation with mild RVR Review of previous notes suggest that this is a new finding for her. Appears to be in sinus rhythm this morning. Continue  current dose of beta blocker. Chads2VASC is at least 4. She is already on anticoagulation for history of PE. Continue for now.  Obesity hypoventilation syndrome Likely reason for her hypercapnia. Pulmonology has seen the patient. Currently stable. No longer requires BiPAP. Avoid overcorrection. Avoid sedative medications. Outpatient sleep study.  Recent NSTEMi She was seen by cardiology during recent hospitalization. She was started on Plavix. She has been managed medically. Due to her morbid obesity, stress test will be of low sensitivity. Continue beta blockers. Resume statin at discharge.   History of recurrent PEs Continue warfarin per pharmacy.  Recent ORIF to right ankle Stable. To be followed by orthopedics in the next week or so. She is nonweightbearing on the right lower extremity.  Normocytic anemia Continue to monitor hemoglobin. No evidence for overt bleeding.   DVT Prophylaxis: On warfarin    Code Status: Full code  Family Communication: Discussed with the patient  Disposition Plan: Await cardiology input. Will need to return to SNF at the time of discharge.    LOS: 4 days   [facility]  [facility] Pager [phone] 01/24/2015, 8:21 AM  If 7PM-7AM, please contact night-coverage at [email or web address], password [email or web address]

## 2015-01-24 NOTE — Consult Note (Addendum)
CARDIOLOGY CONSULT NOTE   Patient ID: [name] MRN: [id number] DOB/AGE: 1945-07-01 69 y.o.  Admit date: 01/11/2015  Primary Physician   No primary care provider on file. Primary Cardiologist   Dr. Wyline Mood Reason for Consultation   Diastolic CHF Requesting Physician Dr. Rito Ehrlich  HPI: [name] is a 69 y.o. female with a history of diastolic CHF, recent non-STEMI, hypertension, diabetes, obesity, COPD and ORIF of right Ankle who was admitted with acute respiratory failure with hypoxemia and hypercapnia.   She was recently 11/2014 hospitalized for a fracture of the right ankle status post ORIF, pneumonia and non-STEMI. Echo 11/2014 LVEF 50-55%, grade II diastolic dysfunction, mild to mod RV dysfunction, PASP 50. During admission diuresed 2.3 liters, discharge weight 343 lbs. Also peak of troponin was 2.12 during admission.  echocardiogram was reassuring with normal left ventricular function and no wall motion abnormality. Managed medically due to medical comorbidities. Started on plavix, no ASA due to coexisting need for anticoag given history of PEs. Stress testing low sensitivity due to severe obesity. Discharged to SNF where her Os stats were in 60s and brought to ED for further evaluation.   She was noted to be hypoxic into the 60s and 70s. X-ray showed pulmonary edema.  She was diuresed with IV Lasix 40 mg twice a day, Net I&O -9618. However weight up 2lb (344-->346lb). Repeat chest x-ray today shows no improvement and cardiology consulted for further evaluation.  She was also to noted to have A. fib with RVR on admission on EKG. Seems this was probably just a brief moment. Tele on admission sinus rhythm with PACs with irregular rhythm. Currently on sinus rhythm.  The patient denies N/V, chest pain, palpitation, orthopnea, PND or syncope. Shortness of breath is stable. Complaining of watery diarrhea x 4 every day since admission.    Past Medical History  Diagnosis Date  .  Ankle fracture, right 11/27/2014  . Dislocation of right ankle joint 11/27/2014  . Shortness of breath dyspnea   . Pneumonia   . Diabetes mellitus without complication (HCC)   . Dementia   . Anxiety   . Hypertension   . CHF (congestive heart failure) (HCC)     Diastolic  . Obesity   . COPD (chronic obstructive pulmonary disease) (HCC)     Home O2 4L nasal cannula     Past Surgical History  Procedure Laterality Date  . External fixation leg Right 11/27/2014    Procedure: EXTERNAL FIXATION LEG FOR RIGHT ANKLE FRACTURE;  Surgeon: Myrene Galas, MD;  Location: [facility] OR;  Service: Orthopedics;  Laterality: Right;  . Back surgery  2008    spinal fusion  . Knee surgery  1998    Right and Left    Allergies  Allergen Reactions  . Talwin [Pentazocine] Other (See Comments)    Extreme hot feeling  . Fentanyl Other (See Comments)    Fainted?  . Latex Itching    I have reviewed the patient's current medications . antiseptic oral rinse  7 mL Mouth Rinse BID  . clopidogrel  75 mg Oral Daily  . furosemide  40 mg Intravenous Q12H  . hydrocerin   Topical Daily  . insulin aspart  0-9 Units Subcutaneous TID WC & HS  . levalbuterol  0.63 mg Nebulization TID  . metoprolol tartrate  25 mg Oral BID  . mirabegron ER  50 mg Oral Daily  . pantoprazole  40 mg Oral Q1200  . potassium chloride  20 mEq Oral  Daily  . sodium chloride  3 mL Intravenous Q12H  . sodium chloride  3 mL Intravenous Q12H  . warfarin  5 mg Oral ONCE-1800  . Warfarin - Pharmacist Dosing Inpatient   Does not apply q1800      sodium chloride, sodium chloride, acetaminophen **OR** acetaminophen, HYDROmorphone (DILAUDID) injection, metoprolol, ondansetron **OR** ondansetron (ZOFRAN) IV, sodium chloride, sodium chloride  Prior to Admission medications   Medication Sig Start Date End Date Taking? Authorizing Provider  albuterol (PROVENTIL) (2.5 MG/3ML) 0.083% nebulizer solution Inhale 3 mLs into the lungs every 6 (six) hours as  needed for wheezing or shortness of breath.  09/26/14  Yes Historical Provider, MD  atorvastatin (LIPITOR) 80 MG tablet Take 1 tablet (80 mg total) by mouth daily at 6 PM. 11/30/14  Yes Renae Fickle, MD  cholecalciferol 2000 UNITS TABS Take 1 tablet (2,000 Units total) by mouth 2 (two) times daily. Patient taking differently: Take 2,000 Units by mouth daily.  11/30/14  Yes Renae Fickle, MD  ferrous fumarate (HEMOCYTE - 106 MG FE) 325 (106 FE) MG TABS tablet Take 1 tablet by mouth daily.    Yes Historical Provider, MD  furosemide (LASIX) 20 MG tablet Take 1 tablet (20 mg total) by mouth daily. 12/15/14  Yes Antoine Poche, MD  JANUVIA 100 MG tablet Take 100 mg by mouth daily. 10/26/14  Yes Historical Provider, MD  LORazepam (ATIVAN) 0.5 MG tablet Take 1 tablet (0.5 mg total) by mouth at bedtime. 11/30/14  Yes Renae Fickle, MD  metFORMIN (GLUCOPHAGE) 500 MG tablet Take 500 mg by mouth 3 (three) times daily.    Yes Historical Provider, MD  metoprolol tartrate (LOPRESSOR) 25 MG tablet Take 0.5 tablets (12.5 mg total) by mouth 2 (two) times daily. 11/30/14  Yes Renae Fickle, MD  mirabegron ER (MYRBETRIQ) 50 MG TB24 tablet Take 50 mg by mouth daily.   Yes Historical Provider, MD  nitroGLYCERIN (NITROSTAT) 0.4 MG SL tablet Place 1 tablet (0.4 mg total) under the tongue every 5 (five) minutes as needed for chest pain. 11/30/14  Yes Renae Fickle, MD  nystatin (MYCOSTATIN/NYSTOP) 100000 UNIT/GM POWD Apply 1 g topically daily as needed (FOR RASH).  10/21/14  Yes Historical Provider, MD  omeprazole (PRILOSEC) 20 MG capsule Take 20 mg by mouth every morning.   Yes Historical Provider, MD  oxyCODONE-acetaminophen (PERCOCET) 10-325 MG tablet Take 1 tablet by mouth every 6 (six) hours as needed for pain. 11/30/14  Yes Renae Fickle, MD  polyethylene glycol powder (GLYCOLAX/MIRALAX) powder Take 17 g by mouth daily. 11/30/14  Yes Renae Fickle, MD  ranitidine (ZANTAC) 300 MG tablet Take 300 mg by mouth at  bedtime. 10/26/14  Yes Historical Provider, MD  warfarin (COUMADIN) 5 MG tablet Take 2.5-5 mg by mouth daily at 6 PM. Every sun, tues, wed, thur, fri, sat. Take 2.5 mg on Monday   Yes Historical Provider, MD  clopidogrel (PLAVIX) 75 MG tablet Take 1 tablet (75 mg total) by mouth daily. Patient not taking: Reported on 2015/02/05 11/30/14   Renae Fickle, MD     Social History   Social History  . Marital Status: Widowed    Spouse Name: N/A  . Number of Children: N/A  . Years of Education: N/A   Occupational History  . Not on file.   Social History Main Topics  . Smoking status: Never Smoker   . Smokeless tobacco: Not on file  . Alcohol Use: No  . Drug Use: No  . Sexual Activity: Not on  file   Other Topics Concern  . Not on file   Social History Narrative     The patient denies any  family history of heart disease  ROS:  Full 14 point review of systems complete and found to be negative unless listed above.  Physical Exam: Blood pressure 137/70, pulse 65, temperature 97.9 F (36.6 C), temperature source Oral, resp. rate 18, height 5\' 2"  (1.575 m), weight 346 lb (156.945 kg), SpO2 91 %.  General: severely obese female in no acute distress laying in bed - nasal cannula in place Head: Eyes PERRLA, No xanthomas. Normocephalic and atraumatic, oropharynx without edema or exudate.  Lungs: Resp regular and unlabored, clear to auscultation anteriorly . (the patient is unable to sit up due to R ankle ORIF) Heart: RRR no s3, s4, or murmurs. Neck: No carotid bruits. No lymphadenopathy.  JVD difficulty assess due to grith.  Abdomen: Bowel sounds present, abdomen soft and non-tender without masses or hernias noted. Msk:  No spine or cva tenderness. No weakness, no joint deformities or effusions. Extremities: No clubbing, cyanosis. Right leg with external fixation for recent ankle fracture. Left leg with erythema and skin peeling which is chronic per patient. ?Trace edema.  Neuro: Alert  and oriented X 3. No focal deficits noted. Psych:  Good affect, responds appropriately Skin: No rashes or lesions noted.  Labs:   Lab Results  Component Value Date   WBC 9.0 01/23/2015   HGB 10.3* 01/23/2015   HCT 36.9 01/23/2015   MCV 92.9 01/23/2015   PLT 321 01/23/2015    Recent Labs  01/24/15 0543  INR 2.69*    Recent Labs Lab 01/24/15 0543  NA 143  K 3.8  CL 88*  CO2 50*  BUN 15  CREATININE 0.59  CALCIUM 9.5  GLUCOSE 154*   MAGNESIUM  Date Value Ref Range Status  01/21/2015 1.6* 1.7 - 2.4 mg/dL Final   Lab Results  Component Value Date   CHOL 103 11/28/2014   HDL 26* 11/28/2014   LDLCALC 59 11/28/2014   TRIG 92 11/28/2014   TSH  Date/Time Value Ref Range Status  11/27/2014 04:45 AM 9.347* 0.350 - 4.500 uIU/mL Final   T3, FREE  Date/Time Value Ref Range Status  11/28/2014 04:05 AM 1.8* 2.0 - 4.4 pg/mL Final    Comment:    (NOTE) Performed At: [facility] [address] [place], [place] [id number] Mila Homer MD JX:[id number]    No results found for: VITAMINB12, FOLATE, FERRITIN, TIBC, IRON, RETICCTPCT  Echocardiogram 11/29/14 Study Conclusions - Left ventricle: The cavity size was normal. Wall thickness wasnormal. Systolic function was normal. The estimated ejectionfraction was in the range of 50% to 55%. Wall motion was normal;there were no regional wall motion abnormalities. Features areconsistent with a pseudonormal left ventricular filling pattern,with concomitant abnormal relaxation and increased fillingpressure (grade 2 diastolic dysfunction). - Aortic valve: There was trivial regurgitation. - Right ventricle: The cavity size was mildly dilated. Systolicfunction was mildly to moderately reduced. - Tricuspid valve: There was mild-moderate regurgitation. - Pulmonary arteries: Systolic pressure was moderately increased.PA peak pressure: 50 mm Hg (S).  ECG:   Vent. rate 124 BPM PR interval * ms QRS duration 92 ms QT/QTc  330/474 ms P-R-T axes * 30 149  Radiology:  Dg Chest Port 1 View  01/24/2015  CLINICAL DATA:  69 year old female with congestive heart failure EXAM: PORTABLE CHEST 1 VIEW COMPARISON:  Prior chest x-ray 01/22/2015 FINDINGS: Unchanged cardiomegaly. Atherosclerotic calcifications again noted in the transverse  aorta. Interval worsening of aeration with increased bilateral interstitial and airspace opacities. Findings are concerning for worsening pulmonary edema. Small bilateral layering effusions. Associated left basilar opacity favored to reflect atelectasis. No acute osseous abnormality. IMPRESSION: Interval progression of pulmonary edema with worsening aeration bilaterally. Probable small bilateral pleural effusions with associated atelectasis versus infiltrate. Atelectasis is favored. Electronically Signed   By: Malachy MoanHeath  McCullough M.D.   On: 01/24/2015 07:25    ASSESSMENT AND PLAN:     1. Acute on chronic diastolic CHF (congestive heart failure) (HCC) - Admitted with acute respiratory failure with hypoxemia and hypercapnia.  - X-ray showed pulmonary edema.  She was diuresed with IV Lasix 40 mg twice a day, Net I&O -9618. However weight up 2lb (344-->346lb). Repeat chest x-ray today shows no improvement.  Recent discharge weight 343 lbs.  - Recent echo 11/2014 LVEF 50-55%, grade II diastolic dysfunction, mild to mod RV dysfunction, PASP 50, no WM abnormality.  - Will discussed with MD. Will change to IV lasix 80mg  BID and monitor renal function closely.   2. Sinus arrythmia -  EKG on admission read as Afib, however clearly has few P waves.  Tele on admission sinus rhythm with PACs with irregular rhythm. Currently on sinus rhythm.  Will review with MD.  - CHADSVASCs score of 4.  - She is on coumadin for recurrent PE.   3. Recent NSTEMI - Plan for medical management. No anginal pain.  - Continue Plavix and BB  4. HTN - Stable and well controlled  Otherwise per primary 4. Acute respiratory  failure with hypercapnia (HCC) 5. Pressure ulcer 6. Obesity hypoventilation syndrome (HCC) 7. Diarrhea    Signed: Sportsmen AcresBhagat,Bhavinkumar, PA 01/24/2015, 12:21 PM Pager [phone]  Co-Sign MD Personally seen and examined. Agree with above.  Several complex comorbidities, morbid obesity for instance. High risk for further morbidity/mortality. Chest x-ray personally viewed still demonstrates pulmonary edema pattern. We have increased Lasix to 80 mg IV twice a day. Continue to push diuresis. EKG from admission does not demonstrate A. Fib. Donato SchultzSKAINS, Alvon Nygaard, MD

## 2015-01-24 NOTE — Progress Notes (Signed)
PT Discharge Note  Patient Details Name: [name] LamasLinda C [name] MRN: [id number] DOB: [birth year]   Cancelled Treatment:    Reason Eval/Treat Not Completed: PT screened, no needs identified, will sign off.  Per patient/chart, patient has been hoyer lift to/from w/c since injury in Oct.  No PT needs identified.  Will sign off.     Olivia CanterMoton, Kuzey Ogata M 01/24/2015, 10:46 AM

## 2015-01-24 NOTE — Care Management Important Message (Signed)
Important Message  Patient Details  Name: Marta LamasLinda C Homes MRN: [id number] Date of Birth: [birth year]   Medicare Important Message Given:  Yes    Kyla BalzarineShealy, Sharvi Mooneyhan Abena 01/24/2015, 12:19 PM

## 2015-01-24 NOTE — Progress Notes (Signed)
ANTICOAGULATION CONSULT NOTE   Pharmacy Consult for Warfarin Indication: atrial fibrillation  Allergies  Allergen Reactions  . Talwin [Pentazocine] Other (See Comments)    Extreme hot feeling  . Fentanyl Other (See Comments)    Fainted?  . Latex Itching    Patient Measurements: Height: 5\' 2"  (157.5 cm) Weight: (!) 346 lb (156.945 kg) (bed scale, pt cannot stand) IBW/kg (Calculated) : 50.1   Vital Signs: Temp: 97.7 F (36.5 C) (12/27 0515) Temp Source: Oral (12/27 0515) BP: 118/65 mmHg (12/27 0937) Pulse Rate: 80 (12/27 0937)  Labs:  Recent Labs  01/22/15 0238 01/23/15 0618 01/24/15 0543  HGB 9.9* 10.3*  --   HCT 34.1* 36.9  --   PLT 369 321  --   LABPROT 34.8* 28.4* 28.2*  INR 3.56* 2.72* 2.69*  CREATININE 0.59 0.58 0.59    Estimated Creatinine Clearance: 97.2 mL/min (by C-G formula based on Cr of 0.59).   Medical History: Past Medical History  Diagnosis Date  . Ankle fracture, right 11/27/2014  . Dislocation of right ankle joint 11/27/2014  . Shortness of breath dyspnea   . Pneumonia   . Diabetes mellitus without complication (HCC)   . Dementia   . Anxiety   . Hypertension   . CHF (congestive heart failure) (HCC)     Diastolic  . Obesity   . COPD (chronic obstructive pulmonary disease) (HCC)     Home O2 4L nasal cannula      Assessment: 69[facility] SOB, O2 sats at the facility were reportedly 60's placed on BiPap and given IV furosemide.    PMH: COPD, Diastolic CHF, HTN, and DM2, and ORIF of right Ankle Fx 10/16 Patient with Afib, INR on admit 2.9 - last warfarin dose PTA 12/22  INR therapeutic at 2.69. Hgb 10.3, Plt 321.  No bleeding noted  Warfarin dose PTA 2.5mg  Mon and 5mg  all other days  Goal of Therapy:  INR 2-3 Monitor platelets by anticoagulation protocol: Yes   Plan:  Warfarin 5 mg x 1 tonight (home dose) CBC q72h, Daily INR Monitor for s/sx bleeding May need dose change at  discharge if pt continues to be supratherapeutic  Hazle NordmannKelsy Combs, PharmD Pharmacy Resident [phone] 01/24/2015 11:09 AM

## 2015-01-25 ENCOUNTER — Inpatient Hospital Stay (HOSPITAL_COMMUNITY): Payer: Medicare Other

## 2015-01-25 DIAGNOSIS — J9601 Acute respiratory failure with hypoxia: Secondary | ICD-10-CM | POA: Insufficient documentation

## 2015-01-25 DIAGNOSIS — J9612 Chronic respiratory failure with hypercapnia: Secondary | ICD-10-CM | POA: Insufficient documentation

## 2015-01-25 DIAGNOSIS — I272 Pulmonary hypertension, unspecified: Secondary | ICD-10-CM | POA: Insufficient documentation

## 2015-01-25 DIAGNOSIS — G4733 Obstructive sleep apnea (adult) (pediatric): Secondary | ICD-10-CM

## 2015-01-25 DIAGNOSIS — J449 Chronic obstructive pulmonary disease, unspecified: Secondary | ICD-10-CM

## 2015-01-25 LAB — BASIC METABOLIC PANEL
Anion gap: 8 (ref 5–15)
BUN: 13 mg/dL (ref 6–20)
CALCIUM: 9.7 mg/dL (ref 8.9–10.3)
CHLORIDE: 85 mmol/L — AB (ref 101–111)
CO2: 50 mmol/L — ABNORMAL HIGH (ref 22–32)
CREATININE: 0.6 mg/dL (ref 0.44–1.00)
Glucose, Bld: 144 mg/dL — ABNORMAL HIGH (ref 65–99)
Potassium: 3.9 mmol/L (ref 3.5–5.1)
SODIUM: 143 mmol/L (ref 135–145)

## 2015-01-25 LAB — GLUCOSE, CAPILLARY
GLUCOSE-CAPILLARY: 138 mg/dL — AB (ref 65–99)
GLUCOSE-CAPILLARY: 106 mg/dL — AB (ref 65–99)
Glucose-Capillary: 127 mg/dL — ABNORMAL HIGH (ref 65–99)
Glucose-Capillary: 114 mg/dL — ABNORMAL HIGH (ref 65–99)
Glucose-Capillary: 169 mg/dL — ABNORMAL HIGH (ref 65–99)

## 2015-01-25 LAB — BLOOD GAS, ARTERIAL
Acid-Base Excess: 25.2 mmol/L — ABNORMAL HIGH (ref 0.0–2.0)
Bicarbonate: 51.7 mEq/L — ABNORMAL HIGH (ref 20.0–24.0)
Drawn by: 437071
FIO2: 0.5
O2 Saturation: 88.5 %
Patient temperature: 98.6
TCO2: 54.2 mmol/L (ref 0–100)
pCO2 arterial: 81.7 mmHg (ref 35.0–45.0)
pH, Arterial: 7.418 (ref 7.350–7.450)
pO2, Arterial: 57.7 mmHg — ABNORMAL LOW (ref 80.0–100.0)

## 2015-01-25 LAB — PROTIME-INR
INR: 2.13 — AB (ref 0.00–1.49)
PROTHROMBIN TIME: 23.7 s — AB (ref 11.6–15.2)

## 2015-01-25 MED ORDER — IPRATROPIUM-ALBUTEROL 0.5-2.5 (3) MG/3ML IN SOLN
3.0000 mL | Freq: Four times a day (QID) | RESPIRATORY_TRACT | Status: DC
Start: 2015-01-25 — End: 2015-02-02
  Administered 2015-01-25 – 2015-02-01 (×24): 3 mL via RESPIRATORY_TRACT
  Filled 2015-01-25 (×30): qty 3

## 2015-01-25 MED ORDER — FUROSEMIDE 10 MG/ML IJ SOLN
80.0000 mg | Freq: Three times a day (TID) | INTRAMUSCULAR | Status: AC
  Administered 2015-01-25: 80 mg via INTRAVENOUS
  Filled 2015-01-25: qty 8

## 2015-01-25 MED ORDER — FUROSEMIDE 10 MG/ML IJ SOLN
INTRAMUSCULAR | Status: AC
  Administered 2015-01-25: 80 mg via INTRAVENOUS
  Filled 2015-01-25: qty 8

## 2015-01-25 MED ORDER — IPRATROPIUM-ALBUTEROL 0.5-2.5 (3) MG/3ML IN SOLN
3.0000 mL | Freq: Four times a day (QID) | RESPIRATORY_TRACT | Status: DC
  Administered 2015-01-25 (×3): 3 mL via RESPIRATORY_TRACT
  Filled 2015-01-25 (×2): qty 3

## 2015-01-25 MED ORDER — IPRATROPIUM-ALBUTEROL 0.5-2.5 (3) MG/3ML IN SOLN
RESPIRATORY_TRACT | Status: AC
  Filled 2015-01-25: qty 3

## 2015-01-25 MED ORDER — FUROSEMIDE 10 MG/ML IJ SOLN: 40.0000 mg | Freq: Once | INTRAMUSCULAR | Status: DC

## 2015-01-25 MED ORDER — WARFARIN SODIUM 5 MG PO TABS
5.0000 mg | ORAL_TABLET | Freq: Once | ORAL | Status: AC
  Administered 2015-01-25: 5 mg via ORAL
  Filled 2015-01-25: qty 1

## 2015-01-25 MED ORDER — FUROSEMIDE 10 MG/ML IJ SOLN
80.0000 mg | Freq: Once | INTRAMUSCULAR | Status: AC
Start: 2015-01-25 — End: 2015-01-25
  Administered 2015-01-25: 80 mg via INTRAVENOUS

## 2015-01-25 NOTE — Progress Notes (Signed)
PROGRESS NOTE  Subjective:    69 y.o. female with a history of diastolic CHF, recent non-STEMI, hypertension, diabetes, morbid obesity, COPD and ORIF of right Ankle who was admitted with acute respiratory failure with hypoxemia and hypercapnia  Had worsening respiratory failure. Was transferred to CCU with BIPAP     Objective:    Vital Signs:   Temp:  [98.1 F (36.7 C)-98.7 F (37.1 C)] 98.7 F (37.1 C) (12/28 1100) Pulse Rate:  [67-85] 70 (12/28 1211) Resp:  [16-21] 21 (12/28 1211) BP: (123-444)/(64-74) 133/74 mmHg (12/28 1100) SpO2:  [92 %-100 %] 96 % (12/28 1211) FiO2 (%):  [45 %-70 %] 55 % (12/28 1211) Weight:  [344 lb (156.037 kg)] 344 lb (156.037 kg) (12/28 0404)  Last BM Date: 01/25/15   24-hour weight change: Weight change:   Weight trends: Filed Weights   01/23/15 0527 01/24/15 0937 01/25/15 0404  Weight: 338 lb (153.316 kg) 346 lb (156.945 kg) 344 lb (156.037 kg)    Intake/Output:  12/27 0701 - 12/28 0700 In: 1180 [P.O.:1180] Out: 6500 [Urine:6500] Total I/O In: 0  Out: 650 [Urine:650]   Physical Exam: BP 133/74 mmHg  Pulse 70  Temp(Src) 98.7 F (37.1 C) (Axillary)  Resp 21  Ht  (1.575 m)  Wt 344 lb (156.037 kg)  BMI 62.90 kg/m2  SpO2 96%  Wt Readings from Last 3 Encounters:  01/25/15 344 lb (156.037 kg)  11/29/14 343 lb 9.6 oz (155.856 kg)    General: Vital signs reviewed and noted. Morbidly obese  Head: Normocephalic, atraumatic.  Eyes: conjunctivae/corneas clear.  EOM's intact.   Throat: normal  Neck:  thick   Lungs:    BIPAP, lungs sound clear anteriorlyh   Heart:  RR   Abdomen:  Soft, non-tender, non-distended    Extremities: Very large, lymphedema    Neurologic: A&O X3, CN II - XII are grossly intact. somulent   Psych: Minimal response    Labs: BMET:  Recent Labs  01/24/15 0543 01/25/15 0313  NA 143 143  K 3.8 3.9  CL 88* 85*  CO2 50* 50*  GLUCOSE 154* 144*  BUN 15 13  CREATININE 0.59 0.60  CALCIUM  9.5 9.7    Liver function tests: No results for input(s): AST, ALT, ALKPHOS, BILITOT, PROT, ALBUMIN in the last 72 hours. No results for input(s): LIPASE, AMYLASE in the last 72 hours.  CBC:  Recent Labs  01/23/15 0618  WBC 9.0  HGB 10.3*  HCT 36.9  MCV 92.9  PLT 321    Cardiac Enzymes: No results for input(s): CKTOTAL, CKMB, TROPONINI in the last 72 hours.  Coagulation Studies:  Recent Labs  01/23/15 0618 01/24/15 0543 01/25/15 0313  LABPROT 28.4* 28.2* 23.7*  INR 2.72* 2.69* 2.13*    Other: Invalid input(s): POCBNP No results for input(s): DDIMER in the last 72 hours. No results for input(s): HGBA1C in the last 72 hours. No results for input(s): CHOL, HDL, LDLCALC, TRIG, CHOLHDL in the last 72 hours. No results for input(s): TSH, T4TOTAL, T3FREE, THYROIDAB in the last 72 hours.  Invalid input(s): FREET3 No results for input(s): VITAMINB12, FOLATE, FERRITIN, TIBC, IRON, RETICCTPCT in the last 72 hours.   Other results:  EKG  ( personally reviewed )  -NSR , rate is normal   Medications:    Infusions:    Scheduled Medications: . antiseptic oral rinse  7 mL Mouth Rinse BID  . clopidogrel  75 mg Oral Daily  . hydrocerin  Topical Daily  . insulin aspart  0-9 Units Subcutaneous TID WC & HS  . ipratropium-albuterol  3 mL Nebulization Q6H  . ipratropium-albuterol      . metoprolol tartrate  25 mg Oral BID  . mirabegron ER  50 mg Oral Daily  . pantoprazole  40 mg Oral Q1200  . potassium chloride  20 mEq Oral Daily  . sodium chloride  3 mL Intravenous Q12H  . sodium chloride  3 mL Intravenous Q12H  . warfarin  5 mg Oral ONCE-1800  . Warfarin - Pharmacist Dosing Inpatient   Does not apply q1800    Assessment/ Plan:   Active Problems:   Ankle fracture, right   Acute on chronic diastolic CHF (congestive heart failure) (HCC)   CHF exacerbation (HCC)   Acute respiratory failure with hypercapnia (HCC)   Pressure ulcer   Atrial fibrillation with RVR  (HCC)   Obesity hypoventilation syndrome (HCC)   Paroxysmal atrial fibrillation (HCC)  1. Chronic diastolic CHF:   She is only slightly volume overloaded.   I suspect that her respiratory distress is more due to a pulmonary issue - ? ARDS. Continue supportive care.  Continue diuresis .   2. Paroxysmal atrial fib:   Back in NSR.  3. Morbid obesity       Disposition:  Length of Stay: 5  Vesta MixerPhilip J. Treniece Holsclaw, Montez HagemanJr., MD, [facility] 01/25/2015, 12:26 PM Office [phone] Pager [phone]

## 2015-01-25 NOTE — Progress Notes (Addendum)
RT note: ABG values given to RN who is contacting MD now. Pt currently on Bipap, but not happy with it. MD on phone now and would prefer the pt to remain on Bipap. If pt not tolerating, RT will try a venturi mask.   ABG result did not cross over yet to Epic: PH 7.458 PCO2 85.2 PO2 83 HCO3 60.4

## 2015-01-25 NOTE — Progress Notes (Signed)
Shift event note:  Notified by RN at approx 0430 regarding pt w/ increased WOB and hypoxia. Unable to keep sats at 90 or greater on 6L [place]. Pt was placed on VM at 45%. Orders given to give pt Lasix 80 mg IV and Duoneb. RN f/u revealed pt WOB improved but still unable to maintain sats greater than 86-88% on VM at 35%. Stat ABG and PCXR ordered. At bedside pt noted awake and alert and oriented to person and place and able to follow commands. She appears pale w/ cyanosis to peripheral nailbeds and lips. BBS diminished w/ faint intermittent exp wheezes though difficult to assess given body habitus. Pt has diuresed approx 1500 cc since IV Lasix and WOB much improved. ABG reveals pH-7.41, pC02-81, p02- and bicarb of 51. CXR findings suggest concern for persistent pulmonary edema (though slightly better from previous CXR from 01/24/15) though could not r/o PNA. Also small bil pleural effusions seen. Pt is afebrile w/o other clinical findings c/w PNA.  Assessment/Plan: 1. Acute on chronic hypoxic/hypercapneic respiratory failure: In setting of morbidly obese pt w/ acute on chronic diastolic CHF, COPD and OHS. Pt appears comfortable on VM at this time. Discussed pt w/ Dr Arsenio LoaderSommer w/ [facility]. Will continue to observe for now given diuresing appears to have improved acute episode. Discussed pt w/ primary MD (Dr Barnie DelG. Krishnan) who has agreed to re-evaluate pt this am and determine need for re-consultation w/ pulmonary service. Discussed plan w/ RN Barnie Del(Aleshia). Will continue to monitor closely on telemetry.   [name] ChangKatherine P. Denny Mccree, NP-C [facility] [phone]

## 2015-01-25 NOTE — Progress Notes (Signed)
RT Note: MD has ordered Bipap for the patient. She has a bed on 2H and will be transported down to the unit. Rapid response Rn, Debbie spoke with Dr. Rito EhrlichKrishnan to see if she is stable enough to wait to be placed on it once she arrives to the unit and he is fine with waiting until she transfers. Bipap will be initiated once she is in her new unit. Patient is presently stable and is saturating 100% on a non rebreather mask. Patient had been placed on a non rebreather mask earlier by an Charity fundraiserN. Rt will continue to monitor.

## 2015-01-25 NOTE — Progress Notes (Signed)
Name: [name] MRN: [id number] DOB: 1945-08-08    ADMISSION DATE:  01/07/2015 CONSULTATION DATE:  01/21/2015  REFERRING MD :  Osvaldo Shipper, M.D. / Hospitalist Service  CHIEF COMPLAINT:  Chronic Hypercarbic Respiratory Failure & Acute Hypoxic Respiratory Failure  BRIEF PATIENT DESCRIPTION: 69 y.o. female with a history of COPD, Diastolic CHF, HTN, and DM2, and ORIF of right Ankle Fx who was sent from the [facility] to the Ed due to worsening SOB. O2 sats at the facility were reportedly 60's. She was administered Lasix at the facility, and placed on 100% O2. In the ED, she was evaluated and placed on BIPAP, after and ABD revealed a ph of 7.31 with a pCO2 of 94.6.  SIGNIFICANT EVENTS  12/23 - Admit 12/28 - Transfer to ICU for Hypoxia  STUDIES:  TTE (11/29/14):  LV normal in size. EF 50-55%. Grade 2 diastolic dysfunction. LA normal in size. RA poorly visualized. RV mildly dilated with mild to moderately reduced systolic function. PASP . Trivial aortic regurg w/o stenosis. No mitral regurg or stenosis. Trivial pulmonic regurg. Mild to moderate tricuspid regurg. No pericardial effusion. Port CXR (01/25/15):  Personally reviewed by me. Patchy bilateral opacities improving. No focal consolidation. Persistent silhouetting of left hemi-diaphragm.  ABG: 12/23 - 7.31 / 95 / 159 12/23 - 7.40 / 80 / 51 12/24 - 7.42 / 80 / 56 12/28 - 7.42 / 82 / 58 (on FiO2 0.5)  LABS: 01/04/2015 Troponin I:  0.00 BNP:  178.8 (previously 163.5 11/27/14)  SUBJECTIVE: Patient denies any chest pain or tightness. She reports ongoing dyspnea but denies any cough. She reports dyspnea has significant improved over the course of her admission. Reports she was taking Lasix once daily at her skilled nursing facility before admission but denies frequent urination. She also reports there was no fluid restriction at her skilled nursing facility prior to admission. Patient has largely been intolerant of BiPAP  and remotely was diagnosed with obstructive sleep apnea but again intolerant of utilizing a mask for CPAP therapy.  REVIEW OF SYSTEMS:  No nausea, vomiting, or abdominal pain. No subjective fever, chills, or sweats.  VITAL SIGNS: Temp:  [98.1 F (36.7 C)-98.7 F (37.1 C)] 98.7 F (37.1 C) (12/28 1100) Pulse Rate:  [65-85] 65 (12/28 1245) Resp:  [16-21] 19 (12/28 1245) BP: (123-444)/(64-74) 133/74 mmHg (12/28 1100) SpO2:  [92 %-100 %] 93 % (12/28 1245) FiO2 (%):  [45 %-70 %] 55 % (12/28 1245) Weight:  [344 lb (156.037 kg)] 344 lb (156.037 kg) (12/28 0404)  PHYSICAL EXAMINATION: General:  Awake. Alert. No acute distress. Sitting watching TV. Family at bedside. Morbidly obese.  Integument:  Warm & dry. No rash on exposed skin.  HEENT:  No scleral injection or icterus. Ventimask in place. PERRL. Cardiovascular:  Regular rate. No appreciable JVD. Normal S1 & S2. Pulmonary:  Diminished aeration bilateral lung bases. Mild expiratory wheeze. Normal work of breathing on Ventimask. Abdomen: Soft. Normal bowel sounds. Protuberant. Neurological: Oriented to person, place, time and situation. No meningismus.   Recent Labs Lab 01/23/15 0618 01/24/15 0543 01/25/15 0313  NA 144 143 143  K 3.7 3.8 3.9  CL 87* 88* 85*  CO2 46* 50* 50*  BUN CREATININE 0.58 0.59 0.60  GLUCOSE 160* 154* 144*    Recent Labs Lab 01/21/15 0439 01/22/15 0238 01/23/15 0618  HGB 10.3* 9.9* 10.3*  HCT 36.3 34.1* 36.9  WBC 9.6 11.6* 9.0  PLT 352 369 321   Dg Chest  Port 1 View  01/25/2015  CLINICAL DATA:  Acute onset of shortness of breath. Initial encounter. EXAM: PORTABLE CHEST 1 VIEW COMPARISON:  Chest radiograph performed 01/24/2015 FINDINGS: The lungs are well-aerated. Persistent bilateral airspace opacification raises concern for persistent pulmonary edema, slightly improved from the prior study, though pneumonia could have a similar appearance. Small bilateral pleural effusions are noted. No  pneumothorax is seen. The cardiomediastinal silhouette is borderline normal in size. No acute osseous abnormalities are seen. IMPRESSION: Bilateral airspace opacification raises concern for persistent pulmonary edema, slightly improved from the prior study, though pneumonia could have a similar appearance. Small bilateral pleural effusions seen. Electronically Signed   By: Roanna RaiderJeffery  Chang M.D.   On: 01/25/2015 06:32   Dg Chest Port 1 View  01/24/2015  CLINICAL DATA:  69 year old female with congestive heart failure EXAM: PORTABLE CHEST 1 VIEW COMPARISON:  Prior chest x-ray 01/22/2015 FINDINGS: Unchanged cardiomegaly. Atherosclerotic calcifications again noted in the transverse aorta. Interval worsening of aeration with increased bilateral interstitial and airspace opacities. Findings are concerning for worsening pulmonary edema. Small bilateral layering effusions. Associated left basilar opacity favored to reflect atelectasis. No acute osseous abnormality. IMPRESSION: Interval progression of pulmonary edema with worsening aeration bilaterally. Probable small bilateral pleural effusions with associated atelectasis versus infiltrate. Atelectasis is favored. Electronically Signed   By: Malachy MoanHeath  McCullough M.D.   On: 01/24/2015 07:25    ASSESSMENT / PLAN:  69 year old morbidly obese female with history of remote diagnosis of obstructive sleep apnea not currently on CPAP therapy as well as reported childhood history of asthma utilizing inhaler medications at home. Patient presents with acute hypoxic respiratory failure. I believe the wheezing on physical exam is likely a cardiac wheeze given the pulmonary edema on her chest x-ray imaging. She does not meet criteria for ARDS and has no alternative diagnoses at this time that would explain her ongoing hypoxic respiratory failure. Given the improvement in her infiltrates on imaging with diuresis I feel that continued diuresis is very reasonable. Certainly if her  pulmonary infiltrates do not completely clear with further diuresis there could be some underlying lung pathology contributing. The patient herself is intolerant of noninvasive positive pressure ventilation and reviewing her ABGs shows she is quite well compensated.  1. Acute hypoxic respiratory failure: Most probably secondary to acute on chronic diastolic congestive heart failure. Recommend continued diuresis with Lasix and pulmonary toilet with incentive spirometry. Recommend repeat chest x-ray imaging once patient is euvolemic to ensure resolution of opacities. 2. History of asthma/COPD: No signs of acute exacerbation at this time. Switching nebulizer therapy to 4 times a day. Patient would likely benefit from outpatient pulmonary function testing to establish a true diagnosis of COPD with her history of asthma and exposure to secondhand smoke. 3. History of pulmonary embolus: Patient therapeutically anticoagulated on admission with Coumadin. 4. OSA: Questionable history of obesity hypoventilation as well. Patient intolerant of BiPAP therapy. Appears compensated for now. Briefly discussed potential long-term risks of untreated OSA. 5. Chronic hypercarbic respiratory failure: Likely multifactorial from underlying possible obstructive airways disease and untreated OSA/OHS. No evidence of acute decompensation. 6. Pulmonary hypertension: Suggested on echocardiogram with right ventricular dysfunction as well as elevated pulmonary artery systolic pressure. Multifactorial given underlying OSA & left ventricular diastolic dysfunction. Continuing diuresis with Lasix IV to establish euvolemia.  We will continue to follow peripherally during this admission. Please notify us if there is a change in the patient's medical status and we can be of any further assistance in evaluation and treatment  of the patient.  Donna Christen Jamison Neighbor, M.D. [facility] Pager:  [phone] After 3pm or if no  response, call [phone] 01/25/2015, 1:43 PM

## 2015-01-25 NOTE — Progress Notes (Signed)
Son, Gabriel RungJoe and dtr n law, Lawson FiscalLori arrived to unit, spoke with their mom, and spoke with nurse, reviewed with them BiPAP refusal, refusal of ventilator if needed later, and currently on VM for her O2 supply. Offered to have MD speak with them to explain condition further, and discuss DNR status with MD. Shari HeritageSon states he knows his mother has said before she didn't want to be put on ventilator. Dr Neoma LamingKrishan sent an text message with son wish to discuss status and DNR. Family at bedside.

## 2015-01-25 NOTE — Progress Notes (Signed)
When attempting to bath patient, it was noted that patient's SpO2 was 82% on 5L nasal cannula with increased use of accessory muscles and tachypnea. RN placed patient on Venti mask at 12L at 35%, no improvement. Rapid Response notified and Venti mask increased to 14L at 55%. No crackles noted, though no air movement is noted to bilateral lower lobes. MD notified, received additional orders.  Troy SineWalker, Sahib Pella M

## 2015-01-25 NOTE — Progress Notes (Signed)
Spoke with Lawson FiscalLori, dtr n law, she and pt's son are on their way to the hospital at this time. Updated as to last entry by this RN. Will inform pt of their coming to hospital.

## 2015-01-25 NOTE — Progress Notes (Signed)
Unable to enter critical values due to technical difficulties. pH 7.41 PCO2 81.7 PO2 57.7 HCO3 51.7 SPO2 88.5  Critical results called to Dianna LimboAleasha Walker, RN at [facility] on 01/25/2015.

## 2015-01-25 NOTE — Progress Notes (Signed)
Pt  refused to keep BiPAP on any longer, even after RN and RT explained the possible consequences of going on a ventilator.   RT placed pt on a VM at 55%/14L. Current sat at 94%, RR 19-20.    Pt making statements "I will sue you if you put me on the vent",   Again tried to explain to pt what could happen if her blood gases continue to get worse. Pt still not stating "no, won't wear it", "I'll sue you".   Offered pt to have MD talk with her son, pt states "he's at work now, you'll have to leave a message and he'll call you back when gets home after dark".   Pt states to call her dtr n law, Lawson FiscalLori, "I'll talk to [place]ori, call her".   Will attempt to call son or dtr n law and update them.  And have them talk with pt.

## 2015-01-25 NOTE — Progress Notes (Signed)
RT Note - RT and RN discussed the MD's request for her to continue to wear Bipap, but the pt refused. RT placed pt on a venturi mask at 14 Lpm 55%. Pt vitals remained stable. RN aware. RT will continue to monitor.

## 2015-01-25 NOTE — Progress Notes (Signed)
RT completed ABG after pt has been on BIPAP X1 hour, CO2 58.2,  pH 7.45,  Bicarb 60.4,  PO2 83  Dr Barnie DelG Krishnan notified of ABG results noted above, pt wants to take BiPAP off, MD prefers pt to stay on it at this time. MD states these results are about where pt stays at. Informed MD that pt may flat out refuse to wear BiPAP anymore.  If does MD states she can come off it for a period of time, but wants pt back on BiPAP.   ( Pt has been made aware that noncompliance with the BiPAP could lead to needing to be put on the ventilator. Pt has stated she doesn't want to go on the vent. Will continue to encourage pt to stay on BiPAP.)

## 2015-01-25 NOTE — Progress Notes (Signed)
Gave patient ordered duoneb and additional dose of lasix as ordered. After treatment, patient's Spo2 was mid 90's. Placed patient back on nasal cannula at 6L. Upon re-assessment, patient's SpO2 was back down to 82%. Replaced Ventimask at 35%, SpO2 up to 90-92%. MD on call notified, awaiting additional orders.  Troy SineWalker, Yariel Ferraris M

## 2015-01-25 NOTE — Progress Notes (Signed)
Called by primary RN to see pt for decreased O2 sats.  On arrival the pt is on a 45%VM with sats 94%, RR25, and able to answer questions and f/c.  Pt repositioned in bed for better lung expansion.  Difficult to auscultate BS due to body habitus, BS clear but diminished in uppers and unable to be heard elsewhere.   Pt was able to yell down the hall with clear and strong voice to get staffs attention to say she did not like wearing the mask.  Will attempt to wean.  Primary RN to update primary team.

## 2015-01-25 NOTE — Progress Notes (Addendum)
[facility] PROGRESS NOTE  [name] NWG:[id number] DOB: 09-29-1945 DOA: 01/10/2015  PCP: No primary care provider on file.  Brief HPI: 69 year old Caucasian female with a past medical history of morbid obesity, COPD, diastolic CHF, hypertension, who was recently hospitalized for a fracture of the right ankle status post ORIF and is currently in a skilled nursing facility. She presented to the emergency department with complaints of worsening shortness of breath. She was noted to be hypoxic into the 60s and 70s. X-ray showed pulmonary edema. She was hospitalized for further management. Despite treatment with IV Lasix. Patient continues to be dyspneic. Chest x-ray shows no improvement. Cardiology was consulted. Patient's respiratory status worsened on the night of 12/7. She had to be transferred back to stepdown on 12/28.  Past medical history:  Past Medical History  Diagnosis Date  . Ankle fracture, right 11/27/2014  . Dislocation of right ankle joint 11/27/2014  . Shortness of breath dyspnea   . Pneumonia   . Diabetes mellitus without complication (HCC)   . Dementia   . Anxiety   . Hypertension   . CHF (congestive heart failure) (HCC)     Diastolic  . Obesity   . COPD (chronic obstructive pulmonary disease) (HCC)     Home O2 4L nasal cannula    Consultants: Pulmonology. Cardiology  Procedures:  Echocardiogram 11/29/14 Study Conclusions - Left ventricle: The cavity size was normal. Wall thickness wasnormal. Systolic function was normal. The estimated ejectionfraction was in the range of 50% to 55%. Wall motion was normal;there were no regional wall motion abnormalities. Features areconsistent with a pseudonormal left ventricular filling pattern,with concomitant abnormal relaxation and increased fillingpressure (grade 2 diastolic dysfunction). - Aortic valve: There was trivial regurgitation. - Right ventricle: The cavity size was mildly dilated. Systolicfunction  was mildly to moderately reduced. - Tricuspid valve: There was mild-moderate regurgitation. - Pulmonary arteries: Systolic pressure was moderately increased.PA peak pressure: 50 mm Hg (S).  Antibiotics: None  Subjective: Patient noted to be more dyspneic this morning. She is on a venti mask. She states that her breathing is about the same. She denies any chest pain. No nausea, vomiting.   Objective: Vital Signs  Filed Vitals:   01/25/15 0958 01/25/15 1000 01/25/15 1100 01/25/15 1211  BP: 123/64 137/74 133/74   Pulse: 85 73 67 70  Temp:   98.7 F (37.1 C)   TempSrc:   Axillary   Resp: Height:      Weight:      SpO2: 97% 98% 100% 96%    Intake/Output Summary (Last 24 hours) at 01/25/15 1223 Last data filed at 01/25/15 0914  Gross per 24 hour  Intake    940 ml  Output   6200 ml  Net  -5260 ml   Filed Weights   01/23/15 0527 01/24/15 0937 01/25/15 0404  Weight: 153.316 kg (338 lb) 156.945 kg (346 lb) 156.037 kg (344 lb)    General appearance: alert, cooperative, appears stated age and morbidly obese Resp: Diminished air entry bilaterally with crackles and wheezing. Marked tachypnea. Cardio: S1, S2 is irregularly irregular. No S3, S4. No rubs, murmurs, or bruit. pedal edema is noted. GI: Obese. Nontender. Bowel sounds present. No obvious hepatomegaly or splenomegaly. Body habitus limits examination. Extremities: Right leg with external fixation for recent ankle fracture. Left leg with erythema and skin peeling which is chronic per patient.  Neurologic: Awake, alert. Oriented 3. No focal neurological deficits.  Lab Results:  Basic Metabolic  Panel:  Recent Labs Lab 01/21/15 0439 01/22/15 0238 01/23/15 0618 01/24/15 0543 01/25/15 0313  NA 142 143 144 143 143  K 4.0 3.5 3.7 3.8 3.9  CL 89* 89* 87* 88* 85*  CO2 45* 47* 46* 50* 50*  GLUCOSE 182* 125* 160* 154* 144*  BUN CREATININE 0.57 0.59 0.58 0.59 0.60  CALCIUM 9.4 9.4 9.6 9.5 9.7   MG 1.6*  --   --   --   --   PHOS 3.3  --   --   --   --    CBC:  Recent Labs Lab 01/13/2015 1900 01/21/15 0439 01/22/15 0238 01/23/15 0618  WBC 13.1* 9.6 11.6* 9.0  NEUTROABS 11.3*  --   --   --   HGB 10.2* 10.3* 9.9* 10.3*  HCT 37.0 36.3 34.1* 36.9  MCV 95.9 93.6 91.7 92.9  PLT 354 352 369 321   BNP (last 3 results)  Recent Labs  11/27/14 0711 01/25/2015 1900  BNP 163.5* 178.8*    CBG:  Recent Labs Lab 01/24/15 1512 01/24/15 1747 01/24/15 2040 01/25/15 0601 01/25/15 1147  GLUCAP 151* 146* 144* 138* 106*    Recent Results (from the past 240 hour(s))  MRSA PCR Screening     Status: None   Collection Time: 01/04/2015 11:32 PM  Result Value Ref Range Status   MRSA by PCR NEGATIVE NEGATIVE Final    Comment:        The GeneXpert MRSA Assay (FDA approved for NASAL specimens only), is one component of a comprehensive MRSA colonization surveillance program. It is not intended to diagnose MRSA infection nor to guide or monitor treatment for MRSA infections.       Studies/Results: Dg Chest Port 1 View  01/25/2015  CLINICAL DATA:  Acute onset of shortness of breath. Initial encounter. EXAM: PORTABLE CHEST 1 VIEW COMPARISON:  Chest radiograph performed 01/24/2015 FINDINGS: The lungs are well-aerated. Persistent bilateral airspace opacification raises concern for persistent pulmonary edema, slightly improved from the prior study, though pneumonia could have a similar appearance. Small bilateral pleural effusions are noted. No pneumothorax is seen. The cardiomediastinal silhouette is borderline normal in size. No acute osseous abnormalities are seen. IMPRESSION: Bilateral airspace opacification raises concern for persistent pulmonary edema, slightly improved from the prior study, though pneumonia could have a similar appearance. Small bilateral pleural effusions seen. Electronically Signed   By: Roanna Raider M.D.   On: 01/25/2015 06:32   Dg Chest Port 1  View  01/24/2015  CLINICAL DATA:  69 year old female with congestive heart failure EXAM: PORTABLE CHEST 1 VIEW COMPARISON:  Prior chest x-ray 01/22/2015 FINDINGS: Unchanged cardiomegaly. Atherosclerotic calcifications again noted in the transverse aorta. Interval worsening of aeration with increased bilateral interstitial and airspace opacities. Findings are concerning for worsening pulmonary edema. Small bilateral layering effusions. Associated left basilar opacity favored to reflect atelectasis. No acute osseous abnormality. IMPRESSION: Interval progression of pulmonary edema with worsening aeration bilaterally. Probable small bilateral pleural effusions with associated atelectasis versus infiltrate. Atelectasis is favored. Electronically Signed   By: Malachy Moan M.D.   On: 01/24/2015 07:25    Medications:  Scheduled: . antiseptic oral rinse  7 mL Mouth Rinse BID  . clopidogrel  75 mg Oral Daily  . hydrocerin   Topical Daily  . insulin aspart  0-9 Units Subcutaneous TID WC & HS  . ipratropium-albuterol  3 mL Nebulization Q6H  . ipratropium-albuterol      . metoprolol tartrate  25 mg  Oral BID  . mirabegron ER  50 mg Oral Daily  . pantoprazole  40 mg Oral Q1200  . potassium chloride  20 mEq Oral Daily  . sodium chloride  3 mL Intravenous Q12H  . sodium chloride  3 mL Intravenous Q12H  . warfarin  5 mg Oral ONCE-1800  . Warfarin - Pharmacist Dosing Inpatient   Does not apply q1800   Continuous:  BMW:UXLKGMPRN:sodium chloride, sodium chloride, acetaminophen **OR** acetaminophen, HYDROmorphone (DILAUDID) injection, metoprolol, ondansetron **OR** ondansetron (ZOFRAN) IV, sodium chloride, sodium chloride  Assessment/Plan:  Active Problems:   Ankle fracture, right   Acute on chronic diastolic CHF (congestive heart failure) (HCC)   CHF exacerbation (HCC)   Acute respiratory failure with hypercapnia (HCC)   Pressure ulcer   Atrial fibrillation with RVR (HCC)   Obesity hypoventilation syndrome  (HCC)   Paroxysmal atrial fibrillation (HCC)    Acute respiratory failure with hypoxia and hypercapnia Patient has become more hypoxic overnight. Repeat chest x-ray continued to show worsening pulmonary edema. Her FiO2 requirements have increased. Repeat ABG was reviewed. She'll be transferred back to stepdown unit and placed on BiPAP. She is known to have hypercapnia at baseline. Her pH is normal. Would not correct the CO2 levels at this time. She was seen by pulmonology earlier during this hospitalization. Discuss with pulmonology by the nurse practitioner this morning. Continue to monitor closely. Will benefit from outpatient sleep study.   Acute on chronic diastolic CHF Patient is not improving as anticipated and actually appears to be getting worse. BiPAP will help with oxygenation. Cardiology is following. Will increase Lasix to 3 times a day. Her weight has not changed as of this morning compared to yesterday. Await further cardiology input. Monitor ins and outs. Daily weights. Echocardiogram report from November was reviewed. In view of her worsening status, we may need to replete echocardiogram. Will defer to cardiology for now. Dry weight is not known. She weighed 154.9 kg in October.  Newly diagnosed Atrial fibrillation with mild RVR Review of previous notes suggest that this is a new finding for her. Appears to be in sinus rhythm. Continue current dose of beta blocker. Chads2VASC is at least 4. She is already on anticoagulation for history of PE. Continue for now.  Obesity hypoventilation syndrome Likely reason for her hypercapnia. Pulmonology has seen the patient. Avoid overcorrection. Avoid sedative medications. Outpatient sleep study.  Recent NSTEMi She was seen by cardiology during previous hospitalization. She was started on Plavix. She has been managed medically. Due to her morbid obesity, stress test will be of low sensitivity. Continue beta blockers. Resume statin at discharge.    History of recurrent PEs Continue warfarin per pharmacy.  Recent ORIF to right ankle Stable. To be followed by orthopedics in the next week or so. She is nonweightbearing on the right lower extremity.  Normocytic anemia Continue to monitor hemoglobin. No evidence for overt bleeding.  ADDENDUM Discussed and updated son, Rogelio SeenJoe Lowrey. He tells me that patient does not want any aggressive care. No CPR, or intubation or other heroic measures. Will Change to DNR. Discussed with RN as well.  DVT Prophylaxis: On warfarin    Code Status: Full code **SEE ABOVE** CHANGE TO DNR. Family Communication: Discussed with the patient. Disposition Plan: Transferred back to stepdown today. Await cardiology input.    LOS: 5 days   [facility] LPKRISHNAN,Messina Kosinski  [facility] Pager [phone] 01/25/2015, 12:23 PM  If 7PM-7AM, please contact night-coverage at [email or web address], password [email or web address]

## 2015-01-25 NOTE — Progress Notes (Signed)
ANTICOAGULATION CONSULT NOTE   Pharmacy Consult for Warfarin Indication: atrial fibrillation  Allergies  Allergen Reactions  . Talwin [Pentazocine] Other (See Comments)    Extreme hot feeling  . Fentanyl Other (See Comments)    Fainted?  . Latex Itching    Patient Measurements: Height: 5\' 2"  (157.5 cm) Weight: (!) 344 lb (156.037 kg) IBW/kg (Calculated) : 50.1   Vital Signs: Temp: 98.1 F (36.7 C) (12/28 0356) Temp Source: Oral (12/28 0356) BP: 444/74 mmHg (12/28 0356) Pulse Rate: 71 (12/28 0356)  Labs:  Recent Labs  01/23/15 0618 01/24/15 0543 01/25/15 0313  HGB 10.3*  --   --   HCT 36.9  --   --   PLT 321  --   --   LABPROT 28.4* 28.2* 23.7*  INR 2.72* 2.69* 2.13*  CREATININE 0.58 0.59 0.60    Estimated Creatinine Clearance: 96.9 mL/min (by C-G formula based on Cr of 0.6).   Medical History: Past Medical History  Diagnosis Date  . Ankle fracture, right 11/27/2014  . Dislocation of right ankle joint 11/27/2014  . Shortness of breath dyspnea   . Pneumonia   . Diabetes mellitus without complication (HCC)   . Dementia   . Anxiety   . Hypertension   . CHF (congestive heart failure) (HCC)     Diastolic  . Obesity   . COPD (chronic obstructive pulmonary disease) (HCC)     Home O2 4L nasal cannula      Assessment: 69[facility] F [facility] due to worsening SOB, O2 sats at the facility were reportedly 60's placed on BiPap and given IV furosemide.    PMH: COPD, Diastolic CHF, HTN, and DM2, and ORIF of right Ankle Fx 10/16 Patient with Afib, INR on admit 2.9 - last warfarin dose PTA 12/22  INR therapeutic at 2.13. Hgb 10.3, Plt 321.  No bleeding noted  Warfarin dose PTA 2.5mg  Mon and 5mg  all other days  Goal of Therapy:  INR 2-3 Monitor platelets by anticoagulation protocol: Yes   Plan:  Warfarin 5 mg x 1 tonight (home dose) CBC q72h, Daily INR Monitor for s/sx bleeding  Hazle NordmannKelsy Combs, PharmD Pharmacy  Resident [phone] 01/25/2015 9:31 AM

## 2015-01-25 NOTE — Care Management Note (Signed)
Case Management Note  Patient Details  Name: [name] LamasLinda C [name] MRN: [id number] Date of Birth: [birth year]  Subjective/Objective:     Adm w resp distress,bipap               Action/Plan: from nsg facility, sw ref placed   Expected Discharge Date:                  Expected Discharge Plan:  Skilled Nursing Facility  In-House Referral:  Clinical Social Work  Discharge planning Services     Post Acute Care Choice:    Choice offered to:     DME Arranged:    DME Agency:     HH Arranged:    HH Agency:     Status of Service:     Medicare Important Message Given:  Yes Date Medicare IM Given:    Medicare IM give by:    Date Additional Medicare IM Given:    Additional Medicare Important Message give by:     If discussed at Long Length of Stay Meetings, dates discussed:    Additional Comments: transferred to sdu today 12-28  Hanley HaysDowell, Tanaya Dunigan T, RN 01/25/2015, 11:43 AM

## 2015-01-26 DIAGNOSIS — I272 Other secondary pulmonary hypertension: Secondary | ICD-10-CM

## 2015-01-26 DIAGNOSIS — S82891A Other fracture of right lower leg, initial encounter for closed fracture: Secondary | ICD-10-CM

## 2015-01-26 LAB — CBC
HEMATOCRIT: 37.7 % (ref 36.0–46.0)
HEMOGLOBIN: 10.4 g/dL — AB (ref 12.0–15.0)
MCH: 25.6 pg — ABNORMAL LOW (ref 26.0–34.0)
MCHC: 27.6 g/dL — ABNORMAL LOW (ref 30.0–36.0)
MCV: 92.6 fL (ref 78.0–100.0)
Platelets: 343 10*3/uL (ref 150–400)
RBC: 4.07 MIL/uL (ref 3.87–5.11)
RDW: 17.4 % — AB (ref 11.5–15.5)
WBC: 11.6 10*3/uL — AB (ref 4.0–10.5)

## 2015-01-26 LAB — GLUCOSE, CAPILLARY
GLUCOSE-CAPILLARY: 148 mg/dL — AB (ref 65–99)
GLUCOSE-CAPILLARY: 182 mg/dL — AB (ref 65–99)
GLUCOSE-CAPILLARY: 152 mg/dL — AB (ref 65–99)
Glucose-Capillary: 112 mg/dL — ABNORMAL HIGH (ref 65–99)

## 2015-01-26 LAB — BASIC METABOLIC PANEL
ANION GAP: 5 (ref 5–15)
BUN: 14 mg/dL (ref 6–20)
CALCIUM: 9.4 mg/dL (ref 8.9–10.3)
CO2: 49 mmol/L — AB (ref 22–32)
Chloride: 86 mmol/L — ABNORMAL LOW (ref 101–111)
Creatinine, Ser: 0.58 mg/dL (ref 0.44–1.00)
Glucose, Bld: 129 mg/dL — ABNORMAL HIGH (ref 65–99)
Potassium: 3.6 mmol/L (ref 3.5–5.1)
Sodium: 140 mmol/L (ref 135–145)

## 2015-01-26 LAB — PROTIME-INR
INR: 1.94 — AB (ref 0.00–1.49)
Prothrombin Time: 22.1 seconds — ABNORMAL HIGH (ref 11.6–15.2)

## 2015-01-26 MED ORDER — FUROSEMIDE 10 MG/ML IJ SOLN
80.0000 mg | Freq: Every day | INTRAMUSCULAR | Status: DC
  Administered 2015-01-26 – 2015-01-29 (×4): 80 mg via INTRAVENOUS
  Filled 2015-01-26 (×6): qty 8

## 2015-01-26 MED ORDER — LORAZEPAM 0.5 MG PO TABS
0.5000 mg | ORAL_TABLET | Freq: Once | ORAL | Status: AC
  Administered 2015-01-27: 0.5 mg via ORAL
  Filled 2015-01-26: qty 1

## 2015-01-26 MED ORDER — WARFARIN SODIUM 5 MG PO TABS
5.0000 mg | ORAL_TABLET | Freq: Once | ORAL | Status: AC
  Administered 2015-01-26: 5 mg via ORAL
  Filled 2015-01-26: qty 1

## 2015-01-26 NOTE — [place] FL2 (Signed)
[place] MEDICAID FL2 LEVEL OF CARE SCREENING TOOL     IDENTIFICATION  Patient Name: [name] Birthdate: 10/21/1945 Sex: female Admission Date (Current Location): 02-03-2015  [facility] and [place] Number:  Producer, television/film/video and Address:  [facility], [address] [place], [place] [address]      Provider Number: [id number]  Attending Physician Name and Address:  Jeralyn Bennett, MD  Relative Name and Phone Number:       Current Level of Care: Hospital Recommended Level of Care: Skilled Nursing Facility Prior Approval Number:    Date Approved/Denied:   PASRR Number: [id number]  Discharge Plan: SNF    Current Diagnoses: Patient Active Problem List   Diagnosis Date Noted  . Acute respiratory failure with hypoxia (HCC)   . Chronic respiratory failure with hypercapnia (HCC)   . OSA (obstructive sleep apnea)   . Pulmonary hypertension (HCC)   . Chronic obstructive pulmonary disease (HCC)   . Paroxysmal atrial fibrillation (HCC)   . Pressure ulcer 01/21/2015  . Atrial fibrillation with RVR (HCC) 01/21/2015  . Obesity hypoventilation syndrome (HCC) 01/21/2015  . CHF exacerbation (HCC) 02/03/2015  . Acute respiratory failure with hypercapnia (HCC) February 03, 2015  . Respiratory distress   . Acute on chronic systolic congestive heart failure (HCC)   . COPD exacerbation (HCC)   . Ankle fracture, right 11/27/2014  . Dislocation of right ankle joint 11/27/2014  . Bilateral pneumonia 11/27/2014  . Acute on chronic diastolic CHF (congestive heart failure) (HCC) 11/27/2014  . NSTEMI (non-ST elevated myocardial infarction) (HCC) 11/27/2014  . Obesity 11/27/2014  . Sleep apnea 11/27/2014  . COPD (chronic obstructive pulmonary disease) (HCC) 11/27/2014  . Chronic venous insufficiency 11/27/2014  . Recurrent pulmonary embolism (HCC) 11/27/2014  . Chronic anticoagulation 11/27/2014  . Diabetes mellitus (HCC) 11/27/2014    Orientation RESPIRATION  BLADDER Height & Weight    Self, Time, Situation, Place  Other (Comment) (Venturi mask, 12/L) Continent  (157.5 cm) 327 lbs.  BEHAVIORAL SYMPTOMS/MOOD NEUROLOGICAL BOWEL NUTRITION STATUS  Other (Comment) (n/a)  (n/a) Incontinent Diet (Please see discharge summary.)  AMBULATORY STATUS COMMUNICATION OF NEEDS Skin   Extensive Assist Verbally PU Stage and Appropriate Care   PU Stage 2 Dressing:  (PRN)                   Personal Care Assistance Level of Assistance  Bathing, Feeding, Dressing Bathing Assistance: Maximum assistance Feeding assistance: Independent Dressing Assistance: Maximum assistance     Functional Limitations Info   (n/a)          SPECIAL CARE FACTORS FREQUENCY  PT (By licensed PT), OT (By licensed OT)     PT Frequency: 5 OT Frequency: 5            Contractures      Additional Factors Info  Allergies, Code Status Code Status Info: DNR Allergies Info: Talwin, Fentanyl, Latex           Current Medications (01/26/2015):  This is the current hospital active medication list Current Facility-Administered Medications  Medication Dose Route Frequency Provider Last Rate Last Dose  . 0.9 %  sodium chloride infusion  250 mL Intravenous PRN Ron Parker, MD      . 0.9 %  sodium chloride infusion  250 mL Intravenous PRN Ron Parker, MD      . acetaminophen (TYLENOL) tablet 650 mg  650 mg Oral Q6H PRN Ron Parker, MD   650 mg at  01/24/15 0028   Or  . acetaminophen (TYLENOL) suppository 650 mg  650 mg Rectal Q6H PRN Ron ParkerHarvette C Jenkins, MD      . antiseptic oral rinse (CPC / CETYLPYRIDINIUM CHLORIDE 0.05%) solution 7 mL  7 mL Mouth Rinse BID Ron ParkerHarvette C Jenkins, MD   7 mL at 01/26/15 1000  . clopidogrel (PLAVIX) tablet 75 mg  75 mg Oral Daily Osvaldo ShipperGokul Krishnan, MD   75 mg at 01/26/15 0924  . furosemide (LASIX) injection 80 mg  80 mg Intravenous Daily Jeralyn BennettEzequiel Zamora, MD   80 mg at 01/26/15 0924  . hydrocerin (EUCERIN) cream   Topical  Daily Osvaldo ShipperGokul Krishnan, MD      . HYDROmorphone (DILAUDID) injection 0.5-1 mg  0.5-1 mg Intravenous Q3H PRN Ron ParkerHarvette C Jenkins, MD      . insulin aspart (novoLOG) injection 0-9 Units  0-9 Units Subcutaneous TID WC & HS Osvaldo ShipperGokul Krishnan, MD   1 Units at 01/26/15 1203  . ipratropium-albuterol (DUONEB) 0.5-2.5 (3) MG/3ML nebulizer solution 3 mL  3 mL Nebulization QID Roslynn AmbleJennings E Nestor, MD   3 mL at 01/26/15 1113  . metoprolol (LOPRESSOR) injection 2.5 mg  2.5 mg Intravenous Q6H PRN Osvaldo ShipperGokul Krishnan, MD   2.5 mg at 01/21/15 1904  . metoprolol tartrate (LOPRESSOR) tablet 25 mg  25 mg Oral BID Osvaldo ShipperGokul Krishnan, MD   25 mg at 01/26/15 0924  . mirabegron ER (MYRBETRIQ) tablet 50 mg  50 mg Oral Daily Osvaldo ShipperGokul Krishnan, MD   50 mg at 01/26/15 0924  . ondansetron (ZOFRAN) tablet 4 mg  4 mg Oral Q6H PRN Ron ParkerHarvette C Jenkins, MD       Or  . ondansetron (ZOFRAN) injection 4 mg  4 mg Intravenous Q6H PRN Ron ParkerHarvette C Jenkins, MD      . pantoprazole (PROTONIX) EC tablet 40 mg  40 mg Oral Q1200 Osvaldo ShipperGokul Krishnan, MD   40 mg at 01/26/15 1203  . potassium chloride SA (K-DUR,KLOR-CON) CR tablet 20 mEq  20 mEq Oral Daily Ron ParkerHarvette C Jenkins, MD   20 mEq at 01/26/15 0924  . sodium chloride 0.9 % injection 3 mL  3 mL Intravenous Q12H Ron ParkerHarvette C Jenkins, MD   3 mL at 01/26/15 0927  . sodium chloride 0.9 % injection 3 mL  3 mL Intravenous PRN Ron ParkerHarvette C Jenkins, MD      . sodium chloride 0.9 % injection 3 mL  3 mL Intravenous Q12H Ron ParkerHarvette C Jenkins, MD   3 mL at 01/26/15 0926  . sodium chloride 0.9 % injection 3 mL  3 mL Intravenous PRN Ron ParkerHarvette C Jenkins, MD      . warfarin (COUMADIN) tablet 5 mg  5 mg Oral ONCE-1800 [place] PinkNicholas P Gazda, [facility] HospitalRPH      . Warfarin - Pharmacist Dosing Inpatient   Does not apply [id number] Osvaldo ShipperGokul Krishnan, MD         Discharge Medications: Please see discharge summary for a list of discharge medications.  Relevant Imaging Results:  Relevant Lab Results:   Additional Information SSN: [id number]  Rojelio BrennerVaughn, Karin Pinedo  S, LCSW [phone]

## 2015-01-26 NOTE — Progress Notes (Signed)
[facility] PROGRESS NOTE  [name] XLK:[id number] DOB: 06/17/1945 DOA: February 08, 2015  PCP: No primary care provider on file.  Brief HPI: 69 year old Caucasian female with a past medical history of morbid obesity, COPD, diastolic CHF, hypertension, who was recently hospitalized for a fracture of the right ankle status post ORIF and is currently in a skilled nursing facility. She presented to the emergency department with complaints of worsening shortness of breath. She was noted to be hypoxic into the 60s and 70s. X-ray showed pulmonary edema. She was hospitalized for further management. Despite treatment with IV Lasix. Patient continues to be dyspneic. Chest x-ray shows no improvement. Cardiology was consulted. Patient's respiratory status worsened on the night of 12/7. She had to be transferred back to stepdown on 12/28.  Past medical history:  Past Medical History  Diagnosis Date  . Ankle fracture, right 11/27/2014  . Dislocation of right ankle joint 11/27/2014  . Shortness of breath dyspnea   . Pneumonia   . Diabetes mellitus without complication (HCC)   . Dementia   . Anxiety   . Hypertension   . CHF (congestive heart failure) (HCC)     Diastolic  . Obesity   . COPD (chronic obstructive pulmonary disease) (HCC)     Home O2 4L nasal cannula    Consultants: Pulmonology. Cardiology  Procedures:  Echocardiogram 11/29/14 Study Conclusions - Left ventricle: The cavity size was normal. Wall thickness wasnormal. Systolic function was normal. The estimated ejectionfraction was in the range of 50% to 55%. Wall motion was normal;there were no regional wall motion abnormalities. Features areconsistent with a pseudonormal left ventricular filling pattern,with concomitant abnormal relaxation and increased fillingpressure (grade 2 diastolic dysfunction). - Aortic valve: There was trivial regurgitation. - Right ventricle: The cavity size was mildly dilated. Systolicfunction  was mildly to moderately reduced. - Tricuspid valve: There was mild-moderate regurgitation. - Pulmonary arteries: Systolic pressure was moderately increased.PA peak pressure: 50 mm Hg (S).  Antibiotics: None  Subjective: Patient noted to be more dyspneic this morning. She is on a venti mask. She states that her breathing is about the same. She denies any chest pain. No nausea, vomiting.   Objective: Vital Signs  Filed Vitals:   01/26/15 0403 01/26/15 0500 01/26/15 0758 01/26/15 0815  BP: 146/68  116/48   Pulse: 67  70   Temp: 97.7 F (36.5 C)  97.3 F (36.3 C)   TempSrc: Oral  Oral   Resp: 19  32   Height:      Weight:  148.326 kg (327 lb)    SpO2: 93%  91% 90%    Intake/Output Summary (Last 24 hours) at 01/26/15 0845 Last data filed at 01/26/15 0600  Gross per 24 hour  Intake      0 ml  Output   4400 ml  Net  -4400 ml   Filed Weights   01/24/15 0937 01/25/15 0404 01/26/15 0500  Weight: 156.945 kg (346 lb) 156.037 kg (344 lb) 148.326 kg (327 lb)    General appearance: alert, cooperative, appears stated age and morbidly obese Resp: Diminished air entry bilaterally with crackles and wheezing. Marked tachypnea. Cardio: S1, S2 is irregularly irregular. No S3, S4. No rubs, murmurs, or bruit. pedal edema is noted. GI: Obese. Nontender. Bowel sounds present. No obvious hepatomegaly or splenomegaly. Body habitus limits examination. Extremities: Right leg with external fixation for recent ankle fracture. Left leg with erythema and skin peeling which is chronic per patient.  Neurologic: Awake, alert. Oriented 3. No focal  neurological deficits.  Lab Results:  Basic Metabolic Panel:  Recent Labs Lab 01/21/15 0439 01/22/15 0238 01/23/15 0618 01/24/15 0543 01/25/15 0313 01/26/15 0415  NA 142 143 144 143 143 140  K 4.0 3.5 3.7 3.8 3.9 3.6  CL 89* 89* 87* 88* 85* 86*  CO2 45* 47* 46* 50* 50* 49*  GLUCOSE 182* 125* 160* 154* 144* 129*  BUN 17 16 14 15 13 14    CREATININE 0.57 0.59 0.58 0.59 0.60 0.58  CALCIUM 9.4 9.4 9.6 9.5 9.7 9.4  MG 1.6*  --   --   --   --   --   PHOS 3.3  --   --   --   --   --    CBC:  Recent Labs Lab 12/25/14 1900 01/21/15 0439 01/22/15 0238 01/23/15 0618 01/26/15 0415  WBC 13.1* 9.6 11.6* 9.0 11.6*  NEUTROABS 11.3*  --   --   --   --   HGB 10.2* 10.3* 9.9* 10.3* 10.4*  HCT 37.0 36.3 34.1* 36.9 37.7  MCV 95.9 93.6 91.7 92.9 92.6  PLT 354 352 369 321 343   BNP (last 3 results)  Recent Labs  11/27/14 0711 12/25/14 1900  BNP 163.5* 178.8*    CBG:  Recent Labs Lab 01/25/15 1147 01/25/15 1608 01/25/15 1744 01/25/15 2148 01/26/15 0753  GLUCAP 106* 127* 114* 169* 112*    Recent Results (from the past 240 hour(s))  MRSA PCR Screening     Status: None   Collection Time: 12/25/14 11:32 PM  Result Value Ref Range Status   MRSA by PCR NEGATIVE NEGATIVE Final    Comment:        The GeneXpert MRSA Assay (FDA approved for NASAL specimens only), is one component of a comprehensive MRSA colonization surveillance program. It is not intended to diagnose MRSA infection nor to guide or monitor treatment for MRSA infections.       Studies/Results: Dg Chest Port 1 View  01/25/2015  CLINICAL DATA:  Acute onset of shortness of breath. Initial encounter. EXAM: PORTABLE CHEST 1 VIEW COMPARISON:  Chest radiograph performed 01/24/2015 FINDINGS: The lungs are well-aerated. Persistent bilateral airspace opacification raises concern for persistent pulmonary edema, slightly improved from the prior study, though pneumonia could have a similar appearance. Small bilateral pleural effusions are noted. No pneumothorax is seen. The cardiomediastinal silhouette is borderline normal in size. No acute osseous abnormalities are seen. IMPRESSION: Bilateral airspace opacification raises concern for persistent pulmonary edema, slightly improved from the prior study, though pneumonia could have a similar appearance. Small  bilateral pleural effusions seen. Electronically Signed   By: Roanna RaiderJeffery  Chang M.D.   On: 01/25/2015 06:32    Medications:  Scheduled: . antiseptic oral rinse  7 mL Mouth Rinse BID  . clopidogrel  75 mg Oral Daily  . furosemide  80 mg Intravenous Daily  . hydrocerin   Topical Daily  . insulin aspart  0-9 Units Subcutaneous TID WC & HS  . ipratropium-albuterol  3 mL Nebulization QID  . metoprolol tartrate  25 mg Oral BID  . mirabegron ER  50 mg Oral Daily  . pantoprazole  40 mg Oral Q1200  . potassium chloride  20 mEq Oral Daily  . sodium chloride  3 mL Intravenous Q12H  . sodium chloride  3 mL Intravenous Q12H  . Warfarin - Pharmacist Dosing Inpatient   Does not apply q1800   Continuous:  ZOX:WRUEAVPRN:sodium chloride, sodium chloride, acetaminophen **OR** acetaminophen, HYDROmorphone (DILAUDID) injection, metoprolol, ondansetron **OR** ondansetron (  ZOFRAN) IV, sodium chloride, sodium chloride  Assessment/Plan:  Active Problems:   Ankle fracture, right   Acute on chronic diastolic CHF (congestive heart failure) (HCC)   CHF exacerbation (HCC)   Acute respiratory failure with hypercapnia (HCC)   Pressure ulcer   Atrial fibrillation with RVR (HCC)   Obesity hypoventilation syndrome (HCC)   Paroxysmal atrial fibrillation (HCC)   Acute respiratory failure with hypoxia (HCC)   Chronic respiratory failure with hypercapnia (HCC)   OSA (obstructive sleep apnea)   Pulmonary hypertension (HCC)   Chronic obstructive pulmonary disease (HCC)    Acute respiratory failure with hypoxia and hypercapnia -Patient has become more hypoxic, repeat chest x-ray continued to show worsening pulmonary edema. Her FiO2 requirements increased. Repeat ABG was reviewed.  -She was transferred back to stepdown unit and placed on BiPAP. She is known to have hypercapnia at baseline. Her pH is normal. Would not correct the CO2 levels at this time. She was seen by pulmonology earlier during this hospitalization. Discuss  with pulmonology by the nurse practitioner.  -Suspect acute CHF precipitating respiratory failure -Remains on venturi mask.  -Will continue IV diuresis  Acute on chronic diastolic CHF -She was diuresed with Lasix 80 mg IV TID yesterday.  -Has a net negative 18.6L -Urine output of 4.4 L over the past 24 hours -Kidney function is stable. Continues to have crackles on exam. Will continue lasix at 80 mg IV daily. Await further input from cardiology.  -Last Echo done on 11/29/2014 that revealed grade 2 diastolic dysfunction with preserved ejection fraction of 50-55%  Newly diagnosed Atrial fibrillation with mild RVR -Chads2VASC is at least 4. She is already on anticoagulation for history of PE. Will continue warfarin -Telemetry showing heart rates in the 70s, and A. Fib. -Continue metoprolol 25 mg by mouth twice a day  Obesity hypoventilation syndrome -Likely reason for her hypercapnia. Pulmonology has seen the patient. Avoid overcorrection. Avoid sedative medications. Outpatient sleep study.  Recent NSTEMi She was seen by cardiology during previous hospitalization. She was started on Plavix. She has been managed medically. Due to her morbid obesity, stress test will be of low sensitivity. Continue beta blockers. Resume statin at discharge.   History of recurrent PEs Continue warfarin per pharmacy.  Recent ORIF to right ankle Stable. To be followed by orthopedics in the next week or so. She is nonweightbearing on the right lower extremity.  Normocytic anemia Continue to monitor hemoglobin. No evidence for overt bleeding.  ADDENDUM Discussed and updated son, Zanaya Baize. He tells me that patient does not want any aggressive care. No CPR, or intubation or other heroic measures. Will Change to DNR. Discussed with RN as well.  DVT Prophylaxis: On warfarin    Code Status: DO NOT RESUSCITATE Family Communication: Discussed with the patient. Disposition Plan: Continue close monitoring in the  step down unit. She continues to require Venturi mask    LOS: 6 days   Jeralyn Bennett  [facility] Pager [phone] 01/26/2015, 8:45 AM  If 7PM-7AM, please contact night-coverage at [email or web address], password [facility]

## 2015-01-26 NOTE — Clinical Social Work Note (Signed)
Clinical Social Work Assessment  Patient Details  Name: [name] LamasLinda C Malinoski MRN: [id number] Date of Birth: 08-28-1945  Date of referral:  01/26/15               Reason for consult:  Facility Placement, Discharge Planning                Permission sought to share information with:  Facility Medical sales representativeContact Representative, Family Supports Permission granted to share information::  Yes, Verbal Permission Granted  Name::     WriterJoe Laabs  Agency::  [facility]  Relationship::  Son  Contact Information:  [phone]  Housing/Transportation Living arrangements for the past 2 months:  Single Family Home, Skilled Nursing Facility Source of Information:  Facility, Adult Children Patient Interpreter Needed:  None Criminal Activity/Legal Involvement Pertinent to Current Situation/Hospitalization:  No - Comment as needed Significant Relationships:  Adult Children Lives with:  Self Do you feel safe going back to the place where you live?  Yes (Patient's son agreeable to [facility] Need for family participation in patient care:  Yes (Comment) (Patient's son active in patient's care.)  Care giving concerns:  Patient's son expressed no concerns at this time.   Social Worker assessment / plan:  CSW received referral stating patient admitted from [facility] CSW confirmed with facility admissions coordinator that patient is from SNF. CSW spoke with patient's son, Gabriel RungJoe, who informed CSW that [facility] approximately one month. Patient's son reports family would prefer for [facility] to [facility] once medically stable. Per patient's son, prior [facility] patient was from home alone and "doing well." CSW to continue to follow and assist with discharge planning needs.  Employment status:  Retired Medical illustratornsurance information:  Managed Medicare ([employer] Medicare) PT Recommendations:  Skilled Nursing Facility Information / Referral  to community resources:  Skilled Nursing Facility  Patient/Family's Response to care:  Patient's son understanding and agreeable to CSW plan of care.  Patient/Family's Understanding of and Emotional Response to Diagnosis, Current Treatment, and Prognosis:  Patient's son understanding and agreeable to CSW plan of care.  Emotional Assessment Appearance:  Other (Comment Required (CSW spoke with patient's son, Joe.) Attitude/Demeanor/Rapport:  Other (CSW spoke with patient's son, Gabriel RungJoe.) Affect (typically observed):  Other (CSW spoke with patient's son, Gabriel RungJoe.) Orientation:  Oriented to Self, Oriented to Place, Oriented to  Time, Oriented to Situation Alcohol / Substance use:  Not Applicable Psych involvement (Current and /or in the community):  No (Comment) (Not appropriate on this admission.)  Discharge Needs  Concerns to be addressed:  No discharge needs identified Readmission within the last 30 days:  No Current discharge risk:  None Barriers to Discharge:  No Barriers Identified   Rod MaeVaughn, Hassen Bruun S, LCSW 01/26/2015, 12:23 PM [phone]

## 2015-01-26 NOTE — Progress Notes (Signed)
PROGRESS NOTE  Subjective:    69 y.o. female with a history of diastolic CHF, recent non-STEMI, hypertension, diabetes, morbid obesity, COPD and ORIF of right Ankle who was admitted with acute respiratory failure with hypoxemia and hypercapnia  Had worsening respiratory failure. Was transferred to CCU with BIPAP  Still hypoxic on ventimask at 12 LPM O2 sat 90%  Has diuresed quite a bit during this hospitalization .     Objective:    Vital Signs:   Temp:  [97.3 F (36.3 C)-98 F (36.7 C)] 97.3 F (36.3 C) (12/29 0758) Pulse Rate:  [65-109] 70 (12/29 0758) Resp:  [19-32] 32 (12/29 0758) BP: (108-146)/(48-86) 116/48 mmHg (12/29 0758) SpO2:  [87 %-100 %] 90 % (12/29 0815) FiO2 (%):  [50 %-55 %] 50 % (12/29 0815) Weight:  [327 lb (148.326 kg)] 327 lb (148.326 kg) (12/29 0500)  Last BM Date: 01/25/15   24-hour weight change: Weight change: -19 lb (-8.618 kg)  Weight trends: Filed Weights   01/24/15 0937 01/25/15 0404 01/26/15 0500  Weight: 346 lb (156.945 kg) 344 lb (156.037 kg) 327 lb (148.326 kg)    Intake/Output:  12/28 0701 - 12/29 0700 In: 0  Out: 4400 [Urine:4400] Total I/O In: 366 [P.O.:360; I.V.:6] Out: 200 [Urine:200]   Physical Exam: BP 116/48 mmHg  Pulse 70  Temp(Src) 97.3 F (36.3 C) (Oral)  Resp 32  Ht 5\' 2"  (1.575 m)  Wt 327 lb (148.326 kg)  BMI 59.79 kg/m2  SpO2 90%  Wt Readings from Last 3 Encounters:  01/26/15 327 lb (148.326 kg)  11/29/14 343 lb 9.6 oz (155.856 kg)    General: Vital signs reviewed and noted. Morbidly obese  Head: Normocephalic, atraumatic.  Eyes: conjunctivae/corneas clear.  EOM's intact.   Throat: normal  Neck:  thick   Lungs:   Shallow respirations, on venti mask  Heart:  RR   Abdomen:  Soft, non-tender, non-distended    Extremities: Very large, lymphedema    Neurologic: A&O X3, CN II - XII are grossly intact. somulent   Psych: Minimal response    Labs: BMET:  Recent Labs  01/25/15 0313  01/26/15 0415  NA 143 140  K 3.9 3.6  CL 85* 86*  CO2 50* 49*  GLUCOSE 144* 129*  BUN 13 14  CREATININE 0.60 0.58  CALCIUM 9.7 9.4    Liver function tests: No results for input(s): AST, ALT, ALKPHOS, BILITOT, PROT, ALBUMIN in the last 72 hours. No results for input(s): LIPASE, AMYLASE in the last 72 hours.  CBC:  Recent Labs  01/26/15 0415  WBC 11.6*  HGB 10.4*  HCT 37.7  MCV 92.6  PLT 343    Cardiac Enzymes: No results for input(s): CKTOTAL, CKMB, TROPONINI in the last 72 hours.  Coagulation Studies:  Recent Labs  01/24/15 0543 01/25/15 0313 01/26/15 0415  LABPROT 28.2* 23.7* 22.1*  INR 2.69* 2.13* 1.94*    Other: Invalid input(s): POCBNP No results for input(s): DDIMER in the last 72 hours. No results for input(s): HGBA1C in the last 72 hours. No results for input(s): CHOL, HDL, LDLCALC, TRIG, CHOLHDL in the last 72 hours. No results for input(s): TSH, T4TOTAL, T3FREE, THYROIDAB in the last 72 hours.  Invalid input(s): FREET3 No results for input(s): VITAMINB12, FOLATE, FERRITIN, TIBC, IRON, RETICCTPCT in the last 72 hours.   Other results:  EKG  ( personally reviewed )  -NSR , rate is normal   Medications:    Infusions:    Scheduled Medications: .  antiseptic oral rinse  7 mL Mouth Rinse BID  . clopidogrel  75 mg Oral Daily  . furosemide  80 mg Intravenous Daily  . hydrocerin   Topical Daily  . insulin aspart  0-9 Units Subcutaneous TID WC & HS  . ipratropium-albuterol  3 mL Nebulization QID  . metoprolol tartrate  25 mg Oral BID  . mirabegron ER  50 mg Oral Daily  . pantoprazole  40 mg Oral Q1200  . potassium chloride  20 mEq Oral Daily  . sodium chloride  3 mL Intravenous Q12H  . sodium chloride  3 mL Intravenous Q12H  . warfarin  5 mg Oral ONCE-1800  . Warfarin - Pharmacist Dosing Inpatient   Does not apply q1800    Assessment/ Plan:   Active Problems:   Ankle fracture, right   Acute on chronic diastolic CHF (congestive heart  failure) (HCC)   CHF exacerbation (HCC)   Acute respiratory failure with hypercapnia (HCC)   Pressure ulcer   Atrial fibrillation with RVR (HCC)   Obesity hypoventilation syndrome (HCC)   Paroxysmal atrial fibrillation (HCC)   Acute respiratory failure with hypoxia (HCC)   Chronic respiratory failure with hypercapnia (HCC)   OSA (obstructive sleep apnea)   Pulmonary hypertension (HCC)   Chronic obstructive pulmonary disease (HCC)  1. Chronic diastolic CHF:   She has normal LV systolic function .  She has grade 2 diastolic CHF.   Her hypoxemia appears to be out of proportion to her diastolic dysfunction .    Continue lasix .   2. Paroxysmal atrial fib:   Back in NSR.  3. Morbid obesity   4. Hypoxemia:   Her hypoxemia seems to be out of proportion to her diastolic dysfunction. She has obesity hypoventilation with subsequent  moderate  pulmonary HTN.      PA pressures in the 50s by echo in Nov. 2016.  Continue lasix   Further plans per PCCM.     Disposition:  Length of Stay: 6  Vesta Mixer, Montez Hageman., MD, [facility] 01/26/2015, 11:00 AM Office [phone] Pager [phone]

## 2015-01-26 NOTE — Progress Notes (Signed)
ANTICOAGULATION CONSULT NOTE   Pharmacy Consult for Warfarin Indication: atrial fibrillation  Allergies  Allergen Reactions  . Talwin [Pentazocine] Other (See Comments)    Extreme hot feeling  . Fentanyl Other (See Comments)    Fainted?  . Latex Itching    Patient Measurements: Height: 5\' 2"  (157.5 cm) Weight: (!) 327 lb (148.326 kg) IBW/kg (Calculated) : 50.1   Vital Signs: Temp: 97.3 [name] (36.3 C) (12/29 0758) Temp Source: Oral (12/29 0758) BP: 116/48 mmHg (12/29 0758) Pulse Rate: 70 (12/29 0758)  Labs:  Recent Labs  01/24/15 0543 01/25/15 0313 01/26/15 0415  HGB  --   --  10.4*  HCT  --   --  37.7  PLT  --   --  343  LABPROT 28.2* 23.7* 22.1*  INR 2.69* 2.13* 1.94*  CREATININE 0.59 0.60 0.58    Estimated Creatinine Clearance: 93.7 mL/min (by C-G formula based on Cr of 0.58).   Medical History: Past Medical History  Diagnosis Date  . Ankle fracture, right 11/27/2014  . Dislocation of right ankle joint 11/27/2014  . Shortness of breath dyspnea   . Pneumonia   . Diabetes mellitus without complication (HCC)   . Dementia   . Anxiety   . Hypertension   . CHF (congestive heart failure) (HCC)     Diastolic  . Obesity   . COPD (chronic obstructive pulmonary disease) (HCC)     Home O2 4L nasal cannula   Assessment: [name] to worsening SOB, O2 sats at the facility were reportedly 60's placed on BiPap and given IV furosemide. Patient with Afib, INR on admit 2.9 - last warfarin dose PTA 12/22. Dose held on 12/25.  INR 2.69>2.13>1.94. Hgb 10.4, Plt 343.  No bleeding noted  Warfarin dose PTA 2.5mg  Mon and 5mg  all other days  Goal of Therapy:  INR 2-3 Monitor platelets by anticoagulation protocol: Yes   Plan:  Warfarin 5 mg x 1 tonight CBC q72h, Daily INR Monitor for s/sx bleeding  Sandi CarneNick Deovion Batrez, PharmD Pharmacy Resident Pager: [phone]  01/26/2015 8:51 AM

## 2015-01-27 DIAGNOSIS — I509 Heart failure, unspecified: Secondary | ICD-10-CM | POA: Insufficient documentation

## 2015-01-27 DIAGNOSIS — I5023 Acute on chronic systolic (congestive) heart failure: Secondary | ICD-10-CM

## 2015-01-27 DIAGNOSIS — J9600 Acute respiratory failure, unspecified whether with hypoxia or hypercapnia: Secondary | ICD-10-CM

## 2015-01-27 DIAGNOSIS — I5032 Chronic diastolic (congestive) heart failure: Secondary | ICD-10-CM

## 2015-01-27 DIAGNOSIS — S82899A Other fracture of unspecified lower leg, initial encounter for closed fracture: Secondary | ICD-10-CM

## 2015-01-27 LAB — CBC
HEMATOCRIT: 35.8 % — AB (ref 36.0–46.0)
Hemoglobin: 10.1 g/dL — ABNORMAL LOW (ref 12.0–15.0)
MCH: 25.9 pg — ABNORMAL LOW (ref 26.0–34.0)
MCHC: 28.2 g/dL — AB (ref 30.0–36.0)
MCV: 91.8 fL (ref 78.0–100.0)
PLATELETS: 367 10*3/uL (ref 150–400)
RBC: 3.9 MIL/uL (ref 3.87–5.11)
RDW: 17.4 % — AB (ref 11.5–15.5)
WBC: 11.7 10*3/uL — ABNORMAL HIGH (ref 4.0–10.5)

## 2015-01-27 LAB — BASIC METABOLIC PANEL
ANION GAP: 10 (ref 5–15)
BUN: 17 mg/dL (ref 6–20)
CHLORIDE: 87 mmol/L — AB (ref 101–111)
CO2: 46 mmol/L — ABNORMAL HIGH (ref 22–32)
Calcium: 9.6 mg/dL (ref 8.9–10.3)
Creatinine, Ser: 0.67 mg/dL (ref 0.44–1.00)
Glucose, Bld: 132 mg/dL — ABNORMAL HIGH (ref 65–99)
POTASSIUM: 4 mmol/L (ref 3.5–5.1)
SODIUM: 143 mmol/L (ref 135–145)

## 2015-01-27 LAB — GLUCOSE, CAPILLARY
GLUCOSE-CAPILLARY: 147 mg/dL — AB (ref 65–99)
Glucose-Capillary: 132 mg/dL — ABNORMAL HIGH (ref 65–99)
Glucose-Capillary: 149 mg/dL — ABNORMAL HIGH (ref 65–99)
Glucose-Capillary: 118 mg/dL — ABNORMAL HIGH (ref 65–99)

## 2015-01-27 LAB — PROTIME-INR
INR: 1.73 — AB (ref 0.00–1.49)
Prothrombin Time: 20.3 seconds — ABNORMAL HIGH (ref 11.6–15.2)

## 2015-01-27 MED ORDER — WARFARIN SODIUM 7.5 MG PO TABS
7.5000 mg | ORAL_TABLET | Freq: Once | ORAL | Status: AC
  Administered 2015-01-27: 7.5 mg via ORAL
  Filled 2015-01-27: qty 1

## 2015-01-27 NOTE — Progress Notes (Signed)
PROGRESS NOTE  Subjective:    69 y.o. female with a history of diastolic CHF, recent non-STEMI, hypertension, diabetes, morbid obesity, COPD and ORIF of right Ankle who was admitted with acute respiratory failure with hypoxemia and hypercapnia  Had worsening respiratory failure. Was transferred to CCU with BIPAP  Still hypoxic on ventimask at 12 LPM O2 sat 90%  Has diuresed quite a bit during this hospitalization ( -20 liters so far this admission) .     Objective:    Vital Signs:   Temp:  [97.8 F (36.6 C)-99.1 F (37.3 C)] 97.8 F (36.6 C) (12/30 0736) Pulse Rate:  [69-91] 69 (12/30 0736) Resp:  [25-31] 26 (12/30 0736) BP: (102-135)/(45-83) 102/76 mmHg (12/30 0736) SpO2:  [87 %-100 %] 100 % (12/30 0815) FiO2 (%):  [50 %] 50 % (12/29 1210) Weight:  [320 lb (145.151 kg)] 320 lb (145.151 kg) (12/30 0500)  Last BM Date: 01/25/15   24-hour weight change: Weight change: -7 lb (-3.175 kg)  Weight trends: Filed Weights   01/25/15 0404 01/26/15 0500 01/27/15 0500  Weight: 344 lb (156.037 kg) 327 lb (148.326 kg) 320 lb (145.151 kg)    Intake/Output:  12/29 0701 - 12/30 0700 In: 606 [P.O.:600; I.V.:6] Out: 2850 [Urine:2850]     Physical Exam: BP 102/76 mmHg  Pulse 69  Temp(Src) 97.8 F (36.6 C) (Oral)  Resp 26  Ht 5\' 2"  (1.575 m)  Wt 320 lb (145.151 kg)  BMI 58.51 kg/m2  SpO2 100%  Wt Readings from Last 3 Encounters:  01/27/15 320 lb (145.151 kg)  11/29/14 343 lb 9.6 oz (155.856 kg)    General: Vital signs reviewed and noted. Morbidly obese, much more awake today   Head: Normocephalic, atraumatic.  Eyes: conjunctivae/corneas clear.  EOM's intact.   Throat: normal  Neck:  thick   Lungs:   Expiratory wheezes,  On facemask O2.    Heart:  RR   Abdomen:  Soft, non-tender, non-distended    Extremities: Very large, lymphedema    Neurologic: A&O X3, CN II - XII are grossly intact.    Psych: Responds normally today     Labs: BMET:  Recent  Labs  01/26/15 0415 01/27/15 0437  NA 140 143  K 3.6 4.0  CL 86* 87*  CO2 49* 46*  GLUCOSE 129* 132*  BUN 14 17  CREATININE 0.58 0.67  CALCIUM 9.4 9.6    Liver function tests: No results for input(s): AST, ALT, ALKPHOS, BILITOT, PROT, ALBUMIN in the last 72 hours. No results for input(s): LIPASE, AMYLASE in the last 72 hours.  CBC:  Recent Labs  01/26/15 0415 01/27/15 0437  WBC 11.6* 11.7*  HGB 10.4* 10.1*  HCT 37.7 35.8*  MCV 92.6 91.8  PLT 343 367    Cardiac Enzymes: No results for input(s): CKTOTAL, CKMB, TROPONINI in the last 72 hours.  Coagulation Studies:  Recent Labs  01/25/15 0313 01/26/15 0415 01/27/15 0437  LABPROT 23.7* 22.1* 20.3*  INR 2.13* 1.94* 1.73*    Other: Invalid input(s): POCBNP No results for input(s): DDIMER in the last 72 hours. No results for input(s): HGBA1C in the last 72 hours. No results for input(s): CHOL, HDL, LDLCALC, TRIG, CHOLHDL in the last 72 hours. No results for input(s): TSH, T4TOTAL, T3FREE, THYROIDAB in the last 72 hours.  Invalid input(s): FREET3 No results for input(s): VITAMINB12, FOLATE, FERRITIN, TIBC, IRON, RETICCTPCT in the last 72 hours.   Other results:  EKG  ( personally reviewed )  -  NSR , rate is normal   Medications:    Infusions:    Scheduled Medications: . antiseptic oral rinse  7 mL Mouth Rinse BID  . clopidogrel  75 mg Oral Daily  . furosemide  80 mg Intravenous Daily  . hydrocerin   Topical Daily  . insulin aspart  0-9 Units Subcutaneous TID WC & HS  . ipratropium-albuterol  3 mL Nebulization QID  . metoprolol tartrate  25 mg Oral BID  . mirabegron ER  50 mg Oral Daily  . pantoprazole  40 mg Oral Q1200  . potassium chloride  20 mEq Oral Daily  . sodium chloride  3 mL Intravenous Q12H  . sodium chloride  3 mL Intravenous Q12H  . Warfarin - Pharmacist Dosing Inpatient   Does not apply q1800    Assessment/ Plan:   Active Problems:   Ankle fracture, right   Acute on chronic  diastolic CHF (congestive heart failure) (HCC)   CHF exacerbation (HCC)   Acute respiratory failure with hypercapnia (HCC)   Pressure ulcer   Atrial fibrillation with RVR (HCC)   Obesity hypoventilation syndrome (HCC)   Paroxysmal atrial fibrillation (HCC)   Acute respiratory failure with hypoxia (HCC)   Chronic respiratory failure with hypercapnia (HCC)   OSA (obstructive sleep apnea)   Pulmonary hypertension (HCC)   Chronic obstructive pulmonary disease (HCC)  1. Chronic diastolic CHF:   She has normal LV systolic function .  She has grade 2 diastolic CHF.   Her hypoxemia has improved.   Continue IV lasix.    2. Paroxysmal atrial fib:   Back in NSR.  3. Morbid obesity   4. Hypoxemia:   Her hypoxemia seems to be out of proportion to her diastolic dysfunction. She has obesity hypoventilation with subsequent  moderate  pulmonary HTN.      PA pressures in the 50s by echo in Nov. 2016.  Continue lasix   Further plans per PCCM.   I see plans to transfer to tele.   Disposition:  Length of Stay: 7  Vesta Mixer, Montez Hageman., MD, [facility] 01/27/2015, 8:16 AM Office [phone] Pager [phone]

## 2015-01-27 NOTE — Care Management Important Message (Signed)
Important Message  Patient Details  Name: Marta LamasLinda C Casimir MRN: [id number] Date of Birth: March 18, 1945   Medicare Important Message Given:  Yes    Elisa Sorlie P Ervey Fallin 01/27/2015, 2:00 PM

## 2015-01-27 NOTE — Progress Notes (Signed)
ANTICOAGULATION CONSULT NOTE   Pharmacy Consult for Warfarin Indication: atrial fibrillation  Allergies  Allergen Reactions  . Talwin [Pentazocine] Other (See Comments)    Extreme hot feeling  . Fentanyl Other (See Comments)    Fainted?  . Latex Itching    Patient Measurements: Height: 5\' 2"  (157.5 cm) Weight: (!) 320 lb (145.151 kg) IBW/kg (Calculated) : 50.1   Vital Signs: Temp: 97.8 F (36.6 C) (12/30 0736) Temp Source: Oral (12/30 0736) BP: 102/76 mmHg (12/30 0736) Pulse Rate: 69 (12/30 0736)  Labs:  Recent Labs  01/25/15 0313 01/26/15 0415 01/27/15 0437  HGB  --  10.4* 10.1*  HCT  --  37.7 35.8*  PLT  --  343 367  LABPROT 23.7* 22.1* 20.3*  INR 2.13* 1.94* 1.73*  CREATININE 0.60 0.58 0.67    Estimated Creatinine Clearance: 92.3 mL/min (by C-G formula based on Cr of 0.67).   Medical History: Past Medical History  Diagnosis Date  . Ankle fracture, right 11/27/2014  . Dislocation of right ankle joint 11/27/2014  . Shortness of breath dyspnea   . Pneumonia   . Diabetes mellitus without complication (HCC)   . Dementia   . Anxiety   . Hypertension   . CHF (congestive heart failure) (HCC)     Diastolic  . Obesity   . COPD (chronic obstructive pulmonary disease) (HCC)     Home O2 4L nasal cannula   Assessment: 69[facility] F [facility] due to worsening SOB, O2 sats at the facility were reportedly 60's placed on BiPap and given IV furosemide. Patient with Afib, INR on admit 2.9 - last warfarin dose PTA 12/22. Dose held on 12/25.  INR continues to trend down to 1.7 today. CBC remains stable.  No bleeding noted. Will give higher dose of coumadin tonight.  Warfarin dose PTA 2.5mg  Mon and 5mg  all other days  Goal of Therapy:  INR 2-3 Monitor platelets by anticoagulation protocol: Yes   Plan:  Warfarin 7.5 mg x 1 tonight CBC q72h, Daily INR Monitor for s/sx bleeding  Sheppard CoilFrank Mare Ludtke PharmD., BCPS Clinical Pharmacist Pager  [phone] 01/27/2015 9:22 AM

## 2015-01-27 NOTE — Progress Notes (Signed)
Patient arrived in the unit via bariatric bed accompanied by NT. Report given prior to transfer. Patient's stable. No aaparent distress noted. Orientation given to the unit. Patient verbalizes understanding.

## 2015-01-27 NOTE — Progress Notes (Signed)
Transferred to 3 east bed 27 by bed, stable, report given to RN, belongings with pt.

## 2015-01-27 NOTE — Progress Notes (Signed)
[facility] PROGRESS NOTE  [name] ZOX:[id number] DOB: [birth year] DOA: 01/07/2015  PCP: No primary care provider on file.  Brief HPI: 69 year old Caucasian female with a past medical history of morbid obesity, COPD, diastolic CHF, hypertension, who was recently hospitalized for a fracture of the right ankle status post ORIF and is currently in a skilled nursing facility. She presented to the emergency department with complaints of worsening shortness of breath. She was noted to be hypoxic into the 60s and 70s. X-ray showed pulmonary edema. She was hospitalized for further management. Despite treatment with IV Lasix. Patient continues to be dyspneic. Chest x-ray shows no improvement. Cardiology was consulted. Patient's respiratory status worsened on the night of 12/7. She had to be transferred back to stepdown on 12/28.  Past medical history:  Past Medical History  Diagnosis Date  . Ankle fracture, right 11/27/2014  . Dislocation of right ankle joint 11/27/2014  . Shortness of breath dyspnea   . Pneumonia   . Diabetes mellitus without complication (HCC)   . Dementia   . Anxiety   . Hypertension   . CHF (congestive heart failure) (HCC)     Diastolic  . Obesity   . COPD (chronic obstructive pulmonary disease) (HCC)     Home O2 4L nasal cannula    Consultants: Pulmonology. Cardiology  Procedures:  Echocardiogram 11/29/14 Study Conclusions - Left ventricle: The cavity size was normal. Wall thickness wasnormal. Systolic function was normal. The estimated ejectionfraction was in the range of 50% to 55%. Wall motion was normal;there were no regional wall motion abnormalities. Features areconsistent with a pseudonormal left ventricular filling pattern,with concomitant abnormal relaxation and increased fillingpressure (grade 2 diastolic dysfunction). - Aortic valve: There was trivial regurgitation. - Right ventricle: The cavity size was mildly dilated. Systolicfunction  was mildly to moderately reduced. - Tricuspid valve: There was mild-moderate regurgitation. - Pulmonary arteries: Systolic pressure was moderately increased.PA peak pressure: 50 mm Hg (S).  Antibiotics: None  Subjective: Patient noted to be more dyspneic this morning. She is on a venti mask. She states that her breathing is about the same. She denies any chest pain. No nausea, vomiting.   Objective: Vital Signs  Filed Vitals:   01/27/15 0000 01/27/15 0300 01/27/15 0500 01/27/15 0736  BP:    102/76  Pulse: 91   69  Temp:  98.3 F (36.8 C)  97.8 F (36.6 C)  TempSrc:  Oral  Oral  Resp: 26   26  Height:      Weight:   145.151 kg (320 lb)   SpO2: 88%   100%    Intake/Output Summary (Last 24 hours) at 01/27/15 0748 Last data filed at 01/26/15 2200  Gross per 24 hour  Intake    606 ml  Output   2300 ml  Net  -1694 ml   Filed Weights   01/25/15 0404 01/26/15 0500 01/27/15 0500  Weight: 156.037 kg (344 lb) 148.326 kg (327 lb) 145.151 kg (320 lb)    General appearance: alert, cooperative, appears stated age and morbidly obese Resp: Diminished air entry bilaterally with crackles and wheezing. Marked tachypnea. Cardio: S1, S2 is irregularly irregular. No S3, S4. No rubs, murmurs, or bruit. pedal edema is noted. GI: Obese. Nontender. Bowel sounds present. No obvious hepatomegaly or splenomegaly. Body habitus limits examination. Extremities: Right leg with external fixation for recent ankle fracture. Left leg with erythema and skin peeling which is chronic per patient.  Neurologic: Awake, alert. Oriented 3. No focal neurological deficits.  Lab Results:  Basic Metabolic Panel:  Recent Labs Lab 01/21/15 0439  01/23/15 0618 01/24/15 0543 01/25/15 0313 01/26/15 0415 01/27/15 0437  NA 142  < > 144 143 143 140 143  K 4.0  < > 3.7 3.8 3.9 3.6 4.0  CL 89*  < > 87* 88* 85* 86* 87*  CO2 45*  < > 46* 50* 50* 49* 46*  GLUCOSE 182*  < > 160* 154* 144* 129* 132*  BUN 17  < > 14  15 13 14 17   CREATININE 0.57  < > 0.58 0.59 0.60 0.58 0.67  CALCIUM 9.4  < > 9.6 9.5 9.7 9.4 9.6  MG 1.6*  --   --   --   --   --   --   PHOS 3.3  --   --   --   --   --   --   < > = values in this interval not displayed. CBC:  Recent Labs Lab 01/05/2015 1900 01/21/15 0439 01/22/15 0238 01/23/15 0618 01/26/15 0415 01/27/15 0437  WBC 13.1* 9.6 11.6* 9.0 11.6* 11.7*  NEUTROABS 11.3*  --   --   --   --   --   HGB 10.2* 10.3* 9.9* 10.3* 10.4* 10.1*  HCT 37.0 36.3 34.1* 36.9 37.7 35.8*  MCV 95.9 93.6 91.7 92.9 92.6 91.8  PLT 354 352 369 321 343 367   BNP (last 3 results)  Recent Labs  11/27/14 0711 01/11/2015 1900  BNP 163.5* 178.8*    CBG:  Recent Labs Lab 01/25/15 2148 01/26/15 0753 01/26/15 1204 01/26/15 1606 01/26/15 2117  GLUCAP 169* 112* 148* 182* 152*    Recent Results (from the past 240 hour(s))  MRSA PCR Screening     Status: None   Collection Time: 01/13/2015 11:32 PM  Result Value Ref Range Status   MRSA by PCR NEGATIVE NEGATIVE Final    Comment:        The GeneXpert MRSA Assay (FDA approved for NASAL specimens only), is one component of a comprehensive MRSA colonization surveillance program. It is not intended to diagnose MRSA infection nor to guide or monitor treatment for MRSA infections.       Studies/Results: No results found.  Medications:  Scheduled: . antiseptic oral rinse  7 mL Mouth Rinse BID  . clopidogrel  75 mg Oral Daily  . furosemide  80 mg Intravenous Daily  . hydrocerin   Topical Daily  . insulin aspart  0-9 Units Subcutaneous TID WC & HS  . ipratropium-albuterol  3 mL Nebulization QID  . metoprolol tartrate  25 mg Oral BID  . mirabegron ER  50 mg Oral Daily  . pantoprazole  40 mg Oral Q1200  . potassium chloride  20 mEq Oral Daily  . sodium chloride  3 mL Intravenous Q12H  . sodium chloride  3 mL Intravenous Q12H  . Warfarin - Pharmacist Dosing Inpatient   Does not apply q1800   Continuous:  NFA:OZHYQMPRN:sodium chloride,  sodium chloride, acetaminophen **OR** acetaminophen, HYDROmorphone (DILAUDID) injection, metoprolol, ondansetron **OR** ondansetron (ZOFRAN) IV, sodium chloride, sodium chloride  Assessment/Plan:  Active Problems:   Ankle fracture, right   Acute on chronic diastolic CHF (congestive heart failure) (HCC)   CHF exacerbation (HCC)   Acute respiratory failure with hypercapnia (HCC)   Pressure ulcer   Atrial fibrillation with RVR (HCC)   Obesity hypoventilation syndrome (HCC)   Paroxysmal atrial fibrillation (HCC)   Acute respiratory failure with hypoxia (HCC)   Chronic respiratory failure with  hypercapnia (HCC)   OSA (obstructive sleep apnea)   Pulmonary hypertension (HCC)   Chronic obstructive pulmonary disease (HCC)    Acute respiratory failure with hypoxia and hypercapnia -Patient has become more hypoxic, repeat chest x-ray continued to show worsening pulmonary edema. Her FiO2 requirements increased. Repeat ABG was reviewed.  -She was transferred back to stepdown unit and placed on BiPAP. She is known to have hypercapnia at baseline. Her pH is normal. Would not correct the CO2 levels at this time. She was seen by pulmonology earlier during this hospitalization. Discuss with pulmonology by the nurse practitioner.  -Suspect acute CHF precipitating respiratory failure -On 01/27/2015 she is showing improvement, now on supplemental oxygen via [place] at 5L.  -Diuresing well.  -Plan to repeat CXR in am  Acute on chronic diastolic CHF -She was diuresed with Lasix 80 mg IV TID yesterday.  -Last Echo done on 11/29/2014 that revealed grade 2 diastolic dysfunction with preserved ejection fraction of 50-55% -AM labs 01/27/2015 showing Kidney function is stable. Improved lung exam. Has a net negative fluid balance of 20.3 L with urine output of 2.3 L overnight.  -Plan to repeat CXR in am.  -Will continue lasix at 80 mg IV daily.   Newly diagnosed Atrial fibrillation with mild RVR -Chads2VASC is at  least 4. She is already on anticoagulation for history of PE. Will continue warfarin -Telemetry showing heart rates in the 70s in  A. Fib. -Continue metoprolol 25 mg by mouth twice a day -Am labs showing INR of 1.7, pharm consulted for coumadin dosing.    Obesity hypoventilation syndrome/OSA -Likely a contributor to respiratory failure. Pulmonology has seen the patient. -Unfortunately she continues to refuse CPAP despite counseling  Recent NSTEMi She was seen by cardiology during previous hospitalization. She was started on Plavix. She has been managed medically. Due to her morbid obesity, stress test will be of low sensitivity. Continue beta blockers. Resume statin at discharge.   History of recurrent PEs Continue warfarin per pharmacy.  Recent ORIF to right ankle Stable. To be followed by orthopedics in the next week or so. She is nonweightbearing on the right lower extremity.  Normocytic anemia Continue to monitor hemoglobin. No evidence for overt bleeding.    DVT Prophylaxis: On warfarin    Code Status: DO NOT RESUSCITATE Family Communication: Discussed with the patient. Disposition Plan: Plan to transfer to telemetry today    LOS: 7 days   Jeralyn Bennett  [facility] Pager [phone] 01/27/2015, 7:48 AM  If 7PM-7AM, please contact night-coverage at [email or web address], password [facility]

## 2015-01-27 NOTE — Progress Notes (Signed)
Patient refused for pillows to be used for support for repositioning off her back. Encouraged patient to lay on her side to allow air to help heal her ulcer on sacrum. Patient stated it doesn't feel good to lay on her side. Will continue to move patient every 2 hours. Pillow placed under her left heel and she stated "my doctor didn't order the pillow under my foot." Again explained importance of keeping her heels off the mattress to prevent skin breakdown. Education needs reinforcement.

## 2015-01-28 ENCOUNTER — Inpatient Hospital Stay (HOSPITAL_COMMUNITY): Payer: Medicare Other

## 2015-01-28 LAB — PROTIME-INR
INR: 1.67 — AB (ref 0.00–1.49)
PROTHROMBIN TIME: 19.7 s — AB (ref 11.6–15.2)

## 2015-01-28 LAB — BASIC METABOLIC PANEL
Anion gap: 11 (ref 5–15)
BUN: 16 mg/dL (ref 6–20)
CO2: 43 mmol/L — AB (ref 22–32)
CREATININE: 0.55 mg/dL (ref 0.44–1.00)
Calcium: 9.5 mg/dL (ref 8.9–10.3)
Chloride: 88 mmol/L — ABNORMAL LOW (ref 101–111)
GFR calc Af Amer: >60 mL/min (ref >60)
GFR calc non Af Amer: >60 mL/min (ref >60)
Glucose, Bld: 121 mg/dL — ABNORMAL HIGH (ref 65–99)
Potassium: 3.9 mmol/L (ref 3.5–5.1)
SODIUM: 142 mmol/L (ref 135–145)

## 2015-01-28 LAB — GLUCOSE, CAPILLARY
GLUCOSE-CAPILLARY: 112 mg/dL — AB (ref 65–99)
GLUCOSE-CAPILLARY: 139 mg/dL — AB (ref 65–99)
GLUCOSE-CAPILLARY: 158 mg/dL — AB (ref 65–99)
GLUCOSE-CAPILLARY: 150 mg/dL — AB (ref 65–99)

## 2015-01-28 MED ORDER — WARFARIN SODIUM 7.5 MG PO TABS
7.5000 mg | ORAL_TABLET | Freq: Once | ORAL | Status: AC
  Administered 2015-01-28: 7.5 mg via ORAL
  Filled 2015-01-28: qty 1

## 2015-01-28 NOTE — Progress Notes (Addendum)
[facility] PROGRESS NOTE  [name] ZOX:[id number] DOB: [birth year] DOA: 01/05/2015  PCP: No primary care provider on file.  Brief HPI: 69 year old Caucasian female with a past medical history of morbid obesity, COPD, diastolic CHF, hypertension, who was recently hospitalized for a fracture of the right ankle status post ORIF and is currently in a skilled nursing facility. She presented to the emergency department with complaints of worsening shortness of breath. She was noted to be hypoxic into the 60s and 70s. X-ray showed pulmonary edema. She was hospitalized for further management. Despite treatment with IV Lasix. Patient continues to be dyspneic. Chest x-ray shows no improvement. Cardiology was consulted. Patient's respiratory status worsened on the night of 12/7. She had to be transferred back to stepdown on 12/28. She was diuresed with IV Lasix having good urinary output. By 01/27/2015 she had a net negative fluid balance of 20 L. Given clinical improvement she was transferred out of the stepdown unit to 3 E. She remains on diuretic therapy. Plan for her to be transferred to skilled nursing facility when medically stable.  Past medical history:  Past Medical History  Diagnosis Date  . Ankle fracture, right 11/27/2014  . Dislocation of right ankle joint 11/27/2014  . Shortness of breath dyspnea   . Pneumonia   . Diabetes mellitus without complication (HCC)   . Dementia   . Anxiety   . Hypertension   . CHF (congestive heart failure) (HCC)     Diastolic  . Obesity   . COPD (chronic obstructive pulmonary disease) (HCC)     Home O2 4L nasal cannula    Consultants: Pulmonology. Cardiology  Procedures:  Echocardiogram 11/29/14 Study Conclusions - Left ventricle: The cavity size was normal. Wall thickness wasnormal. Systolic function was normal. The estimated ejectionfraction was in the range of 50% to 55%. Wall motion was normal;there were no regional wall motion  abnormalities. Features areconsistent with a pseudonormal left ventricular filling pattern,with concomitant abnormal relaxation and increased fillingpressure (grade 2 diastolic dysfunction). - Aortic valve: There was trivial regurgitation. - Right ventricle: The cavity size was mildly dilated. Systolicfunction was mildly to moderately reduced. - Tricuspid valve: There was mild-moderate regurgitation. - Pulmonary arteries: Systolic pressure was moderately increased.PA peak pressure: 50 mm Hg (S).  Antibiotics: None  Subjective: [name] awake and alert, no acute distress. Think she is feeling better this morning. Reports tolerating by mouth intake  Objective: Vital Signs  Filed Vitals:   01/27/15 1434 01/27/15 2037 01/27/15 2050 01/28/15 0646  BP: 111/47 120/47  115/51  Pulse: 77 72  71  Temp: 98.7 F (37.1 C) 97.6 F (36.4 C)  98.9 F (37.2 C)  TempSrc: Oral Oral  Oral  Resp: Height:  (1.575 m)     Weight: 147.873 kg (326 lb)   142.429 kg (314 lb)  SpO2: 92% 95% 92% 94%    Intake/Output Summary (Last 24 hours) at 01/28/15 0816 Last data filed at 01/28/15 8119  Gross per 24 hour  Intake    590 ml  Output   1602 ml  Net  -1012 ml   Filed Weights   01/27/15 0500 01/27/15 1434 01/28/15 0646  Weight: 145.151 kg (320 lb) 147.873 kg (326 lb) 142.429 kg (314 lb)    General appearance: alert, cooperative, appears stated age and morbidly obese Resp: Diminished air entry bilaterally with crackles and wheezing. Marked tachypnea. Cardio: S1, S2 is irregularly irregular. No S3, S4. No rubs, murmurs, or bruit.  pedal edema is noted. GI: Obese. Nontender. Bowel sounds present. No obvious hepatomegaly or splenomegaly. Body habitus limits examination. Extremities: Right leg with external fixation for recent ankle fracture. Left leg with erythema and skin peeling which is chronic per patient.  Neurologic: Awake, alert. Oriented 3. No focal neurological  deficits.  Lab Results:  Basic Metabolic Panel:  Recent Labs Lab 01/23/15 0618 01/24/15 0543 01/25/15 0313 01/26/15 0415 01/27/15 0437  NA 144 143 143 140 143  K 3.7 3.8 3.9 3.6 4.0  CL 87* 88* 85* 86* 87*  CO2 46* 50* 50* 49* 46*  GLUCOSE 160* 154* 144* 129* 132*  BUN CREATININE 0.58 0.59 0.60 0.58 0.67  CALCIUM 9.6 9.5 9.7 9.4 9.6   CBC:  Recent Labs Lab 01/22/15 0238 01/23/15 0618 01/26/15 0415 01/27/15 0437  WBC 11.6* 9.0 11.6* 11.7*  HGB 9.9* 10.3* 10.4* 10.1*  HCT 34.1* 36.9 37.7 35.8*  MCV 91.7 92.9 92.6 91.8  PLT 369 321 343 367   BNP (last 3 results)  Recent Labs  11/27/14 0711 01/03/2015 1900  BNP 163.5* 178.8*    CBG:  Recent Labs Lab 01/27/15 0735 01/27/15 1204 01/27/15 1726 01/27/15 2128 01/28/15 0605  GLUCAP 132* 147* 149* 118* 112*    Recent Results (from the past 240 hour(s))  MRSA PCR Screening     Status: None   Collection Time: 01/08/2015 11:32 PM  Result Value Ref Range Status   MRSA by PCR NEGATIVE NEGATIVE Final    Comment:        The GeneXpert MRSA Assay (FDA approved for NASAL specimens only), is one component of a comprehensive MRSA colonization surveillance program. It is not intended to diagnose MRSA infection nor to guide or monitor treatment for MRSA infections.       Studies/Results: No results found.  Medications:  Scheduled: . antiseptic oral rinse  7 mL Mouth Rinse BID  . clopidogrel  75 mg Oral Daily  . furosemide  80 mg Intravenous Daily  . hydrocerin   Topical Daily  . insulin aspart  0-9 Units Subcutaneous TID WC & HS  . ipratropium-albuterol  3 mL Nebulization QID  . metoprolol tartrate  25 mg Oral BID  . mirabegron ER  50 mg Oral Daily  . pantoprazole  40 mg Oral Q1200  . potassium chloride  20 mEq Oral Daily  . sodium chloride  3 mL Intravenous Q12H  . sodium chloride  3 mL Intravenous Q12H  . Warfarin - Pharmacist Dosing Inpatient   Does not apply q1800   Continuous:   WUJ:WJXBJY chloride, sodium chloride, acetaminophen **OR** acetaminophen, HYDROmorphone (DILAUDID) injection, metoprolol, ondansetron **OR** ondansetron (ZOFRAN) IV, sodium chloride, sodium chloride  Assessment/Plan:  Active Problems:   Ankle fracture, right   Acute on chronic diastolic CHF (congestive heart failure) (HCC)   CHF exacerbation (HCC)   Acute respiratory failure with hypercapnia (HCC)   Pressure ulcer   Atrial fibrillation with RVR (HCC)   Obesity hypoventilation syndrome (HCC)   Paroxysmal atrial fibrillation (HCC)   Acute respiratory failure with hypoxia (HCC)   Chronic respiratory failure with hypercapnia (HCC)   OSA (obstructive sleep apnea)   Pulmonary hypertension (HCC)   Chronic obstructive pulmonary disease (HCC)   CHF (congestive heart failure) (HCC)    Acute respiratory failure with hypoxia and hypercapnia -Patient has become more hypoxic, repeat chest x-ray continued to show worsening pulmonary edema. Her FiO2 requirements increased. Repeat ABG was reviewed.  -She was transferred back to  stepdown unit and placed on BiPAP. She is known to have hypercapnia at baseline. Her pH is normal. Would not correct the CO2 levels at this time. She was seen by pulmonology earlier during this hospitalization. Discuss with pulmonology by the nurse practitioner.  -Suspect acute CHF precipitating respiratory failure -On 01/27/2015 she is showing improvement, now on supplemental oxygen via [place] at 5L.  -Diuresing well.  -On at 01/28/2015 there have been no significant events overnight. She remains on 5 L nasal cannula, having oxygen saturations between 92 and 94%. I reviewed it personally chest x-ray from this morning, I believe there is some improvement to airspace opacification, awaiting formal radiology interpretation. BMP is pending.  -Remains on Lasix 80 mg IV daily, await further recommendations from cardiology.   Acute on chronic diastolic CHF -She was diuresed with Lasix  80 mg IV TID yesterday.  -Last Echo done on 11/29/2014 that revealed grade 2 diastolic dysfunction with preserved ejection fraction of 50-55% -AM labs 01/27/2015 showing Kidney function is stable. Improved lung exam. Has a net negative fluid balance of 20.3 L with urine output of 2.3 L overnight.  -Repeat CXR on 01/28/2015 pending radiology interpretation.   -She remains on Lasix 80 mg IV q daily, await further recommendations from cardiology.   Newly diagnosed Atrial fibrillation with mild RVR -Chads2VASC is at least 4. She is already on anticoagulation for history of PE. Will continue warfarin -Telemetry showing heart rates in the 70s in  A. Fib. -Continue metoprolol 25 mg by mouth twice a day -Am labs showing INR of 1.6, pharm consulted for coumadin dosing.    Obesity hypoventilation syndrome/OSA -Likely a contributor to respiratory failure. Pulmonology has seen the patient. -Unfortunately she continues to refuse CPAP despite counseling  Recent NSTEMi She was seen by cardiology during previous hospitalization. She was started on Plavix. She has been managed medically. Due to her morbid obesity, stress test will be of low sensitivity. Continue beta blockers. Resume statin at discharge.   History of recurrent PEs Continue warfarin per pharmacy.  Recent ORIF to right ankle Stable. To be followed by orthopedics in the next week or so. She is nonweightbearing on the right lower extremity.  Normocytic anemia Continue to monitor hemoglobin. No evidence for overt bleeding.    DVT Prophylaxis: On warfarin    Code Status: DO NOT RESUSCITATE Family Communication: Discussed with the patient. Disposition Plan: Plan to transition to SNF     LOS: 8 days   Jeralyn BennettZAMORA, Sally Reimers  [facility] Pager [phone] 01/28/2015, 8:16 AM  If 7PM-7AM, please contact night-coverage at [email or web address], password [email or web address]

## 2015-01-28 NOTE — Progress Notes (Signed)
    SUBJECTIVE:  Breathing OK.  No complaints   PHYSICAL EXAM Filed Vitals:   01/27/15 2037 01/27/15 2050 01/28/15 0646 01/28/15 0841  BP: 120/47  115/51   Pulse: 72  71   Temp: 97.6 F (36.4 C)  98.9 F (37.2 C)   TempSrc: Oral  Oral   Resp: 22  22   Height:      Weight:   314 lb (142.429 kg)   SpO2: 95% 92% 94% 90%   General:  No acute distress Lungs:  Clear Heart:  Regular with ectopy Abdomen:  Positive bowel sounds, no rebound no guarding Extremities:  Some edema  LABS:  Results for orders placed or performed during the hospital encounter of 01/08/2015 (from the past 24 hour(s))  Glucose, capillary     Status: Abnormal   Collection Time: 01/27/15 12:04 PM  Result Value Ref Range   Glucose-Capillary 147 (H) 65 - 99 mg/dL   Comment 1 Capillary Specimen   Glucose, capillary     Status: Abnormal   Collection Time: 01/27/15  5:26 PM  Result Value Ref Range   Glucose-Capillary 149 (H) 65 - 99 mg/dL  Glucose, capillary     Status: Abnormal   Collection Time: 01/27/15  9:28 PM  Result Value Ref Range   Glucose-Capillary 118 (H) 65 - 99 mg/dL  Protime-INR     Status: Abnormal   Collection Time: 01/28/15  4:44 AM  Result Value Ref Range   Prothrombin Time 19.7 (H) 11.6 - 15.2 seconds   INR 1.67 (H) 0.00 - 1.49  Glucose, capillary     Status: Abnormal   Collection Time: 01/28/15  6:05 AM  Result Value Ref Range   Glucose-Capillary 112 (H) 65 - 99 mg/dL  Basic metabolic panel     Status: Abnormal   Collection Time: 01/28/15 10:08 AM  Result Value Ref Range   Sodium 142 135 - 145 mmol/L   Potassium 3.9 3.5 - 5.1 mmol/L   Chloride 88 (L) 101 - 111 mmol/L   CO2 43 (H) 22 - 32 mmol/L   Glucose, Bld 121 (H) 65 - 99 mg/dL   BUN 16 6 - 20 mg/dL   Creatinine, Ser 1.610.55 0.44 - 1.00 mg/dL   Calcium 9.5 8.9 - 09.610.3 mg/dL   GFR calc non Af Amer >60 >60 mL/min   GFR calc Af Amer >60 >60 mL/min   Anion gap 11 5 - 15    Intake/Output Summary (Last 24 hours) at 01/28/15  1144 Last data filed at 01/28/15 0900  Gross per 24 hour  Intake    630 ml  Output    702 ml  Net    -72 ml     ASSESSMENT AND PLAN:  ACUTE ON CHRONIC DIASTOLIC HF:    Her weight is down about 30 lbs since admission.   Difficult to assess volume but I would suggest continuing the IV diuresis daily for now.    PAF:    On warfarin per pharmacy.  Currently in NSR.  Continue current therapy.    Fayrene FearingJames [facility] 01/28/2015 11:44 AM

## 2015-01-28 NOTE — Progress Notes (Signed)
Pt a/ox4 no c/o pain, pt bathed, VSS, pt stable and resting comfortably

## 2015-01-28 NOTE — Progress Notes (Signed)
ANTICOAGULATION CONSULT NOTE   Pharmacy Consult for Warfarin Indication: atrial fibrillation  Patient Measurements: Height: 5\' 2"  (157.5 cm) Weight: (!) 314 lb (142.429 kg) IBW/kg (Calculated) : 50.1  Vital Signs: Temp: 98.9 F (37.2 C) (12/31 0646) Temp Source: Oral (12/31 0646) BP: 115/51 mmHg (12/31 0646) Pulse Rate: 71 (12/31 0646)  Labs:  Recent Labs  01/26/15 0415 01/27/15 0437 01/28/15 0444  HGB 10.4* 10.1*  --   HCT 37.7 35.8*  --   PLT 343 367  --   LABPROT 22.1* 20.3* 19.7*  INR 1.94* 1.73* 1.67*  CREATININE 0.58 0.67  --     Assessment: 69[facility] due to worsening SOB, O2 sats at the facility were reportedly 60's placed on BiPap and given IV furosemide. Patient with Afib, INR on admit 2.9 - last warfarin dose PTA 12/22. Dose held on 12/25.  INR continues to trend down to 1.67 today. CBC remains stable.  No bleeding noted. Will give higher dose of coumadin tonight.  Warfarin dose PTA 2.5mg  Mon and 5mg  all other days  Goal of Therapy:  INR 2-3 Monitor platelets by anticoagulation protocol: Yes   Plan:   Warfarin 7.5 mg x 1 tonight CBC q72h, Daily INR Monitor for s/sx bleeding  Pollyann SamplesAndy Sathvika Ojo, PharmD, BCPS 01/28/2015, 10:00 AM Pager: [phone]

## 2015-01-28 NOTE — Progress Notes (Signed)
Patient moved from 2H to 3E. CSW aware of patient and no change in d/c plan. [facility].  Lorri Frederickonna T. Jaci LazierCrowder, KentuckyLCSW [phone]

## 2015-01-29 LAB — BASIC METABOLIC PANEL
ANION GAP: 8 (ref 5–15)
BUN: 19 mg/dL (ref 6–20)
CHLORIDE: 88 mmol/L — AB (ref 101–111)
CO2: 46 mmol/L — ABNORMAL HIGH (ref 22–32)
Calcium: 9.5 mg/dL (ref 8.9–10.3)
Creatinine, Ser: 0.64 mg/dL (ref 0.44–1.00)
GFR calc Af Amer: >60 mL/min (ref >60)
GLUCOSE: 128 mg/dL — AB (ref 65–99)
POTASSIUM: 3.6 mmol/L (ref 3.5–5.1)
Sodium: 142 mmol/L (ref 135–145)

## 2015-01-29 LAB — CBC
HEMATOCRIT: 37.3 % (ref 36.0–46.0)
HEMOGLOBIN: 10.5 g/dL — AB (ref 12.0–15.0)
MCH: 26.1 pg (ref 26.0–34.0)
MCHC: 28.2 g/dL — ABNORMAL LOW (ref 30.0–36.0)
MCV: 92.6 fL (ref 78.0–100.0)
PLATELETS: 355 10*3/uL (ref 150–400)
RBC: 4.03 MIL/uL (ref 3.87–5.11)
RDW: 17.2 % — ABNORMAL HIGH (ref 11.5–15.5)
WBC: 11.6 10*3/uL — AB (ref 4.0–10.5)

## 2015-01-29 LAB — GLUCOSE, CAPILLARY
GLUCOSE-CAPILLARY: 149 mg/dL — AB (ref 65–99)
GLUCOSE-CAPILLARY: 144 mg/dL — AB (ref 65–99)
Glucose-Capillary: 124 mg/dL — ABNORMAL HIGH (ref 65–99)
Glucose-Capillary: 134 mg/dL — ABNORMAL HIGH (ref 65–99)

## 2015-01-29 LAB — PROTIME-INR
INR: 2.4 — ABNORMAL HIGH (ref 0.00–1.49)
Prothrombin Time: 25.8 seconds — ABNORMAL HIGH (ref 11.6–15.2)

## 2015-01-29 MED ORDER — WARFARIN SODIUM 2.5 MG PO TABS
2.5000 mg | ORAL_TABLET | Freq: Once | ORAL | Status: AC
  Administered 2015-01-29: 2.5 mg via ORAL
  Filled 2015-01-29: qty 1

## 2015-01-29 NOTE — Progress Notes (Signed)
Pt noted to have stool under her when foley care performed earlier. I spoke to pt and explained that tech and I were going to turn her to clean her/ give back care. Pt told me that she didn't want to be touched, kept saying "no, no, no!" CHS Incech Carrie also sp[oke to pt , but pt refused to allow us to care for her. Pt on air mattress which is turning her regularly.

## 2015-01-29 NOTE — Progress Notes (Signed)
[facility] PROGRESS NOTE  [name] ZOX:[id number] DOB: 1945-12-22 DOA: 01/26/2015  PCP: No primary care provider on file.  Brief HPI: 70 year old Caucasian female with a past medical history of morbid obesity, COPD, diastolic CHF, hypertension, who was recently hospitalized for a fracture of the right ankle status post ORIF and is currently in a skilled nursing facility. She presented to the emergency department with complaints of worsening shortness of breath. She was noted to be hypoxic into the 60s and 70s. X-ray showed pulmonary edema. She was hospitalized for further management. Despite treatment with IV Lasix. Patient continues to be dyspneic. Chest x-ray shows no improvement. Cardiology was consulted. Patient's respiratory status worsened on the night of 12/27. She had to be transferred back to stepdown on 12/28. She was diuresed with IV Lasix having good urinary output. By 01/27/2015 she had a net negative fluid balance of 20 L. Given clinical improvement she was transferred out of the stepdown unit to 3 E. She remains on diuretic therapy. Plan for her to be transferred to skilled nursing facility when medically stable.  Subjective: [name] awake and alert, no acute distress. Think she is feeling better this morning. Reports tolerating by mouth intake  Assessment/Plan: Acute respiratory failure with hypoxia and hypercapnia - likely multifactorial due to pulmonary edema in the setting of acute on chronic diastolic heart failure, morbid obesity with hypoventilation and OSA - currently stable on nasal cannula  - Remains on Lasix 80 mg IV daily  Acute on chronic diastolic CHF - Last Echo done on 11/29/2014 that revealed grade 2 diastolic dysfunction with preserved ejection fraction of 50-55% - She remains on Lasix 80 mg IV q daily. Renal function stable  Newly diagnosed Atrial fibrillation with mild RVR - Chads2VASC is at least 4. She is already on anticoagulation  for history of PE. Will continue warfarin - Telemetry showing heart rates in the 70s in  A. Fib. - Continue metoprolol 25 mg by mouth twice a day -INR 2.4 this morning  Obesity hypoventilation syndrome/OSA - Likely a contributor to respiratory failure. Pulmonology has seen the patient. - Unfortunately she continues to refuse CPAP despite counseling  Recent NSTEMi - She was seen by cardiology during previous hospitalization. She was started on Plavix. She has been managed medically. Due to her morbid obesity, stress test will be of low sensitivity. Continue beta blockers. Resume statin at discharge.   History of recurrent PEs - Continue warfarin per pharmacy.  Recent ORIF to right ankle - Stable. To be followed by orthopedics in the next week or so. She is nonweightbearing on the right lower extremity.  Normocytic anemia - Continue to monitor hemoglobin. No evidence for overt bleeding.   DVT Prophylaxis: On warfarin    Code Status: DO NOT RESUSCITATE Family Communication: Discussed with the patient. Disposition Plan: Plan to transition to SNF   Consultants:  Pulmonology.  Cardiology  Procedures:  Echocardiogram 11/29/14 Study Conclusions - Left ventricle: The cavity size was normal. Wall thickness wasnormal. Systolic function was normal. The estimated ejectionfraction was in the range of 50% to 55%. Wall motion was normal;there were no regional wall motion abnormalities. Features areconsistent with a pseudonormal left ventricular filling pattern,with concomitant abnormal relaxation and increased fillingpressure (grade 2 diastolic dysfunction). - Aortic valve: There was trivial regurgitation. - Right ventricle: The cavity size was mildly dilated. Systolicfunction was mildly to moderately reduced. - Tricuspid valve: There was mild-moderate regurgitation. - Pulmonary arteries: Systolic pressure was moderately increased.PA peak pressure: 50  mm Hg  (S).  Antibiotics: None  Objective: Vital Signs  Filed Vitals:   01/28/15 2126 01/28/15 2152 01/29/15 0611 01/29/15 0919  BP:  128/76 122/53   Pulse: 73 63 71   Temp:  98 F (36.7 C) 97.8 F (36.6 C)   TempSrc:  Oral Oral   Resp: 18 20 18    Height:      Weight:   140.615 kg (310 lb)   SpO2: 94% 91% 92% 93%    Intake/Output Summary (Last 24 hours) at 01/29/15 0950 Last data filed at 01/29/15 0841  Gross per 24 hour  Intake    720 ml  Output   2604 ml  Net  -1884 ml   Filed Weights   01/27/15 1434 01/28/15 0646 01/29/15 0611  Weight: 147.873 kg (326 lb) 142.429 kg (314 lb) 140.615 kg (310 lb)   General appearance: alert, cooperative, appears stated age and morbidly obese Resp: Diminished air entry bilaterally with crackles and wheezing. Marked tachypnea. Cardio: S1, S2 is irregularly irregular. No S3, S4. No rubs, murmurs, or bruit. pedal edema is noted. GI: Obese. Nontender. Bowel sounds present. No obvious hepatomegaly or splenomegaly. Body habitus limits examination. Extremities: Right leg with external fixation for recent ankle fracture. Left leg with erythema and skin peeling which is chronic per patient.  Neurologic: Awake, alert. Oriented 3. No focal neurological deficits.  Lab Results:  Basic Metabolic Panel:  Recent Labs Lab 01/25/15 0313 01/26/15 0415 01/27/15 0437 01/28/15 1008 01/29/15 0433  NA 143 140 143 142 142  K 3.9 3.6 4.0 3.9 3.6  CL 85* 86* 87* 88* 88*  CO2 50* 49* 46* 43* 46*  GLUCOSE 144* 129* 132* 121* 128*  BUN 13 14 17 16 19   CREATININE 0.60 0.58 0.67 0.55 0.64  CALCIUM 9.7 9.4 9.6 9.5 9.5   CBC:  Recent Labs Lab 01/23/15 0618 01/26/15 0415 01/27/15 0437 01/29/15 0433  WBC 9.0 11.6* 11.7* 11.6*  HGB 10.3* 10.4* 10.1* 10.5*  HCT 36.9 37.7 35.8* 37.3  MCV 92.9 92.6 91.8 92.6  PLT 321 343 367 355   BNP (last 3 results)  Recent Labs  11/27/14 0711 01/16/2015 1900  BNP 163.5* 178.8*   CBG:  Recent Labs Lab  01/28/15 0605 01/28/15 1153 01/28/15 1644 01/28/15 2203 01/29/15 0553  GLUCAP 112* 139* 158* 150* 124*    Recent Results (from the past 240 hour(s))  MRSA PCR Screening     Status: None   Collection Time: 01/15/2015 11:32 PM  Result Value Ref Range Status   MRSA by PCR NEGATIVE NEGATIVE Final    Comment:        The GeneXpert MRSA Assay (FDA approved for NASAL specimens only), is one component of a comprehensive MRSA colonization surveillance program. It is not intended to diagnose MRSA infection nor to guide or monitor treatment for MRSA infections.     Studies/Results: Dg Chest Port 1 View  01/28/2015  CLINICAL DATA:  Hypoxemia EXAM: PORTABLE CHEST 1 VIEW COMPARISON:  01/25/2015 FINDINGS: Moderate enlargement of the cardiopericardial silhouette with bilateral interstitial opacities and patchy airspace opacities in the lungs, most confluent in the retrocardiac position of the left lower lobe, with indistinctness of the left hemidiaphragm. Atherosclerotic aortic arch. Thoracic spondylosis. IMPRESSION: 1. Moderate enlargement of the cardiopericardial silhouette with worsening of the interstitial opacity and bilateral patchy airspace opacity which may reflect edema or atypical pneumonia. 2. Obscuration of the left hemidiaphragm possibly from pleural fluid or atelectasis. Electronically Signed   By: Gaylyn RongWalter  Liebkemann  M.D.   On: 01/28/2015 08:24   Medications:  Scheduled: . antiseptic oral rinse  7 mL Mouth Rinse BID  . clopidogrel  75 mg Oral Daily  . furosemide  80 mg Intravenous Daily  . hydrocerin   Topical Daily  . insulin aspart  0-9 Units Subcutaneous TID WC & HS  . ipratropium-albuterol  3 mL Nebulization QID  . metoprolol tartrate  25 mg Oral BID  . mirabegron ER  50 mg Oral Daily  . pantoprazole  40 mg Oral Q1200  . potassium chloride  20 mEq Oral Daily  . sodium chloride  3 mL Intravenous Q12H  . sodium chloride  3 mL Intravenous Q12H  . warfarin  2.5 mg Oral  ONCE-1800  . Warfarin - Pharmacist Dosing Inpatient   Does not apply q1800   Continuous:  ZOX:WRUEAV chloride, sodium chloride, acetaminophen **OR** acetaminophen, HYDROmorphone (DILAUDID) injection, metoprolol, ondansetron **OR** ondansetron (ZOFRAN) IV, sodium chloride, sodium chloride     LOS: 9 days   Pamella Pert  [facility] Pager [phone] 01/29/2015, 9:50 AM  If 7PM-7AM, please contact night-coverage at [email or web address], password [facility]

## 2015-01-29 NOTE — Progress Notes (Signed)
ANTICOAGULATION CONSULT NOTE   Pharmacy Consult for Warfarin Indication: atrial fibrillation  Patient Measurements: Height: 5\' 2"  (157.5 cm) Weight: (!) 310 lb (140.615 kg) IBW/kg (Calculated) : 50.1  Vital Signs: Temp: 97.8 F (36.6 C) (01/01 0611) Temp Source: Oral (01/01 0611) BP: 122/53 mmHg (01/01 0611) Pulse Rate: 71 (01/01 0611)  Labs:  Recent Labs  01/27/15 0437 01/28/15 0444 01/28/15 1008 01/29/15 0433  HGB 10.1*  --   --  10.5*  HCT 35.8*  --   --  37.3  PLT 367  --   --  355  LABPROT 20.3* 19.7*  --  25.8*  INR 1.73* 1.67*  --  2.40*  CREATININE 0.67  --  0.55 0.64    Assessment: 69yo F [facility] ED due to worsening SOB, O2 sats at the facility were reportedly 60's placed on BiPap and given IV furosemide. Patient with Afib, INR on admit 2.9 - last warfarin dose PTA 12/22. Dose held on 12/25.  INR trended up briskly o/n (1.7 >> 2.4) . CBC remains stable.  No bleeding noted.  Warfarin dose PTA 2.5mg  Mon and 5mg  all other days  Goal of Therapy:  INR 2-3 Monitor platelets by anticoagulation protocol: Yes   Plan:   Warfarin 2.5 mg x 1 tonight; may be able to resume home dosing regimen soon CBC q72h, Daily INR Monitor for s/sx bleeding  Pollyann SamplesAndy Jannice Beitzel, PharmD, BCPS 01/29/2015, 9:27 AM Pager: [phone]

## 2015-01-29 NOTE — Progress Notes (Signed)
SUBJECTIVE:  Breathing OK.  No complaints   PHYSICAL EXAM Filed Vitals:   01/28/15 1300 01/28/15 2126 01/28/15 2152 01/29/15 0611  BP: 122/63  128/76 122/53  Pulse: 77 73 63 71  Temp: 98.5 F (36.9 C)  98 F (36.7 C) 97.8 F (36.6 C)  TempSrc: Oral  Oral Oral  Resp: 18 18 20 18   Height:      Weight:    310 lb (140.615 kg)  SpO2: 93% 94% 91% 92%   General:  No acute distress Lungs:  Clear Heart:  Regular with ectopy Abdomen:  Positive bowel sounds, no rebound no guarding Extremities:  Some dependent edema  LABS:  Results for orders placed or performed during the hospital encounter of 01/02/2015 (from the past 24 hour(s))  Basic metabolic panel     Status: Abnormal   Collection Time: 01/28/15 10:08 AM  Result Value Ref Range   Sodium 142 135 - 145 mmol/L   Potassium 3.9 3.5 - 5.1 mmol/L   Chloride 88 (L) 101 - 111 mmol/L   CO2 43 (H) 22 - 32 mmol/L   Glucose, Bld 121 (H) 65 - 99 mg/dL   BUN 16 6 - 20 mg/dL   Creatinine, Ser 5.280.55 0.44 - 1.00 mg/dL   Calcium 9.5 8.9 - 41.310.3 mg/dL   GFR calc non Af Amer >60 >60 mL/min   GFR calc Af Amer >60 >60 mL/min   Anion gap 11 5 - 15  Glucose, capillary     Status: Abnormal   Collection Time: 01/28/15 11:53 AM  Result Value Ref Range   Glucose-Capillary 139 (H) 65 - 99 mg/dL   Comment 1 Notify RN   Glucose, capillary     Status: Abnormal   Collection Time: 01/28/15  4:44 PM  Result Value Ref Range   Glucose-Capillary 158 (H) 65 - 99 mg/dL  Glucose, capillary     Status: Abnormal   Collection Time: 01/28/15 10:03 PM  Result Value Ref Range   Glucose-Capillary 150 (H) 65 - 99 mg/dL  Protime-INR     Status: Abnormal   Collection Time: 01/29/15  4:33 AM  Result Value Ref Range   Prothrombin Time 25.8 (H) 11.6 - 15.2 seconds   INR 2.40 (H) 0.00 - 1.49  CBC     Status: Abnormal   Collection Time: 01/29/15  4:33 AM  Result Value Ref Range   WBC 11.6 (H) 4.0 - 10.5 K/uL   RBC 4.03 3.87 - 5.11 MIL/uL   Hemoglobin 10.5 (L)  12.0 - 15.0 g/dL   HCT 24.437.3 01.036.0 - 27.246.0 %   MCV 92.6 78.0 - 100.0 fL   MCH 26.1 26.0 - 34.0 pg   MCHC 28.2 (L) 30.0 - 36.0 g/dL   RDW 53.617.2 (H) 64.411.5 - 03.415.5 %   Platelets 355 150 - 400 K/uL  Basic metabolic panel     Status: Abnormal   Collection Time: 01/29/15  4:33 AM  Result Value Ref Range   Sodium 142 135 - 145 mmol/L   Potassium 3.6 3.5 - 5.1 mmol/L   Chloride 88 (L) 101 - 111 mmol/L   CO2 46 (H) 22 - 32 mmol/L   Glucose, Bld 128 (H) 65 - 99 mg/dL   BUN 19 6 - 20 mg/dL   Creatinine, Ser 7.420.64 0.44 - 1.00 mg/dL   Calcium 9.5 8.9 - 59.510.3 mg/dL   GFR calc non Af Amer >60 >60 mL/min   GFR calc Af Amer >60 >60 mL/min  Anion gap 8 5 - 15  Glucose, capillary     Status: Abnormal   Collection Time: 01/29/15  5:53 AM  Result Value Ref Range   Glucose-Capillary 124 (H) 65 - 99 mg/dL    Intake/Output Summary (Last 24 hours) at 01/29/15 0757 Last data filed at 01/29/15 0616  Gross per 24 hour  Intake    600 ml  Output   2604 ml  Net  -2004 ml     ASSESSMENT AND PLAN:  ACUTE ON CHRONIC DIASTOLIC HF:    Her weight is down about 30 lbs since admission.   Difficult to assess volume but I would suggest continuing the IV diuresis daily again.    PAF:    On warfarin per pharmacy.  Currently in NSR.  Continue current therapy.      Fayrene Fearing [facility] 01/29/2015 7:57 AM

## 2015-01-29 DEATH — deceased

## 2015-01-30 LAB — BASIC METABOLIC PANEL
Anion gap: 11 (ref 5–15)
BUN: 19 mg/dL (ref 6–20)
CALCIUM: 9.3 mg/dL (ref 8.9–10.3)
CO2: 42 mmol/L — ABNORMAL HIGH (ref 22–32)
CREATININE: 0.5 mg/dL (ref 0.44–1.00)
Chloride: 88 mmol/L — ABNORMAL LOW (ref 101–111)
GFR calc Af Amer: >60 mL/min (ref >60)
Glucose, Bld: 122 mg/dL — ABNORMAL HIGH (ref 65–99)
POTASSIUM: 3.7 mmol/L (ref 3.5–5.1)
SODIUM: 141 mmol/L (ref 135–145)

## 2015-01-30 LAB — PROTIME-INR
INR: 2.8 — AB (ref 0.00–1.49)
Prothrombin Time: 29.1 seconds — ABNORMAL HIGH (ref 11.6–15.2)

## 2015-01-30 LAB — GLUCOSE, CAPILLARY
GLUCOSE-CAPILLARY: 109 mg/dL — AB (ref 65–99)
Glucose-Capillary: 146 mg/dL — ABNORMAL HIGH (ref 65–99)
Glucose-Capillary: 171 mg/dL — ABNORMAL HIGH (ref 65–99)
Glucose-Capillary: 199 mg/dL — ABNORMAL HIGH (ref 65–99)

## 2015-01-30 MED ORDER — DEXTROSE 5 % IV SOLN
3.0000 g | INTRAVENOUS | Status: AC
  Administered 2015-01-31: 3 g via INTRAVENOUS
  Filled 2015-01-30 (×2): qty 3000

## 2015-01-30 MED ORDER — WARFARIN SODIUM 2.5 MG PO TABS
2.5000 mg | ORAL_TABLET | Freq: Once | ORAL | Status: AC
  Administered 2015-01-30: 2.5 mg via ORAL
  Filled 2015-01-30: qty 1

## 2015-01-30 NOTE — Progress Notes (Signed)
[facility] PROGRESS NOTE  Shaindy Reader Filosa ZOX:[id number] DOB: 22-Sep-1945 DOA: 02-16-2015  PCP: No primary care provider on file.  Brief HPI: 70 year old Caucasian female with a past medical history of morbid obesity, COPD, diastolic CHF, hypertension, who was recently hospitalized for a fracture of the right ankle status post ORIF and is currently in a skilled nursing facility. She presented to the emergency department with complaints of worsening shortness of breath. She was noted to be hypoxic into the 60s and 70s. X-ray showed pulmonary edema. She was hospitalized for further management. Despite treatment with IV Lasix. Patient continues to be dyspneic. Chest x-ray shows no improvement. Cardiology was consulted. Patient's respiratory status worsened on the night of 12/27. She had to be transferred back to stepdown on 12/28. She was diuresed with IV Lasix having good urinary output. By 01/27/2015 she had a net negative fluid balance of 20 L. Given clinical improvement she was transferred out of the stepdown unit to 3 E. She remains on diuretic therapy. Plan for her to be transferred to skilled nursing facility when medically stable.  Subjective: - no complaints this morning  Assessment/Plan: Acute respiratory failure with hypoxia and hypercapnia - likely multifactorial due to pulmonary edema in the setting of acute on chronic diastolic heart failure, morbid obesity with hypoventilation and OSA - currently stable on nasal cannula  - Remains on Lasix 80 mg IV daily per cards  Acute on chronic diastolic CHF - Last Echo done on 11/29/2014 that revealed grade 2 diastolic dysfunction with preserved ejection fraction of 50-55% - She remains on Lasix 80 mg IV q daily. Renal function stable  Newly diagnosed Atrial fibrillation with mild RVR - Chads2VASC is at least 4. She is already on anticoagulation for history of PE. Will continue warfarin - Telemetry showing heart rates in the 70s in   A. Fib. - Continue metoprolol 25 mg by mouth twice a day  Obesity hypoventilation syndrome/OSA - Likely a contributor to respiratory failure. Pulmonology has seen the patient. - Unfortunately she continues to refuse CPAP despite counseling  Recent NSTEMi - She was seen by cardiology during previous hospitalization. She was started on Plavix. She has been managed medically. Due to her morbid obesity, stress test will be of low sensitivity. Continue beta blockers. Resume statin at discharge.   History of recurrent PEs - Continue warfarin per pharmacy.  Recent ORIF to right ankle - Stable.  - to OR tomorrow for removal of fixator  Normocytic anemia - Continue to monitor hemoglobin. No evidence for overt bleeding.   DVT Prophylaxis: On warfarin    Code Status: DO NOT RESUSCITATE Family Communication: Discussed with the patient. Disposition Plan: Plan to transition to SNF   Consultants:  Pulmonology.  Cardiology  Procedures:  Echocardiogram 11/29/14 Study Conclusions - Left ventricle: The cavity size was normal. Wall thickness wasnormal. Systolic function was normal. The estimated ejectionfraction was in the range of 50% to 55%. Wall motion was normal;there were no regional wall motion abnormalities. Features areconsistent with a pseudonormal left ventricular filling pattern,with concomitant abnormal relaxation and increased fillingpressure (grade 2 diastolic dysfunction). - Aortic valve: There was trivial regurgitation. - Right ventricle: The cavity size was mildly dilated. Systolicfunction was mildly to moderately reduced. - Tricuspid valve: There was mild-moderate regurgitation. - Pulmonary arteries: Systolic pressure was moderately increased.PA peak pressure: 50 mm Hg (S).  Antibiotics: None  Objective: Vital Signs  Filed Vitals:   01/29/15 1933 01/29/15 2230 01/30/15 0618 01/30/15 0902  BP:  128/54  116/46   Pulse:  66 65   Temp:  98 F (36.7 C) 97.8 F (36.6  C)   TempSrc:  Oral Oral   Resp:  18 18   Height:      Weight:   140.434 kg (309 lb 9.6 oz)   SpO2: 95% 94% 93% 92%    Intake/Output Summary (Last 24 hours) at 01/30/15 1120 Last data filed at 01/30/15 1055  Gross per 24 hour  Intake    855 ml  Output   2125 ml  Net  -1270 ml   Filed Weights   01/28/15 0646 01/29/15 0611 01/30/15 0618  Weight: 142.429 kg (314 lb) 140.615 kg (310 lb) 140.434 kg (309 lb 9.6 oz)   General appearance: alert, cooperative, appears stated age and morbidly obese Resp: Diminished air entry bilaterally with crackles and wheezing. Marked tachypnea. Cardio: S1, S2 is irregularly irregular. No S3, S4. No rubs, murmurs, or bruit. pedal edema is noted. GI: Obese. Nontender. Bowel sounds present. No obvious hepatomegaly or splenomegaly. Body habitus limits examination. Extremities: Right leg with external fixation for recent ankle fracture. Left leg with erythema and skin peeling which is chronic per patient.  Neurologic: Awake, alert. Oriented 3. No focal neurological deficits.  Lab Results:  Basic Metabolic Panel:  Recent Labs Lab 01/26/15 0415 01/27/15 0437 01/28/15 1008 01/29/15 0433 01/30/15 0410  NA 140 143 142 142 141  K 3.6 4.0 3.9 3.6 3.7  CL 86* 87* 88* 88* 88*  CO2 49* 46* 43* 46* 42*  GLUCOSE 129* 132* 121* 128* 122*  BUN 14 17 16 19 19   CREATININE 0.58 0.67 0.55 0.64 0.50  CALCIUM 9.4 9.6 9.5 9.5 9.3   CBC:  Recent Labs Lab 01/26/15 0415 01/27/15 0437 01/29/15 0433  WBC 11.6* 11.7* 11.6*  HGB 10.4* 10.1* 10.5*  HCT 37.7 35.8* 37.3  MCV 92.6 91.8 92.6  PLT 343 367 355   BNP (last 3 results)  Recent Labs  11/27/14 0711 12/29/2014 1900  BNP 163.5* 178.8*   CBG:  Recent Labs Lab 01/29/15 0553 01/29/15 1146 01/29/15 1636 01/29/15 2147 01/30/15 0630  GLUCAP 124* 134* 149* 144* 109*    Recent Results (from the past 240 hour(s))  MRSA PCR Screening     Status: None   Collection Time: 01/06/2015 11:32 PM  Result  Value Ref Range Status   MRSA by PCR NEGATIVE NEGATIVE Final    Comment:        The GeneXpert MRSA Assay (FDA approved for NASAL specimens only), is one component of a comprehensive MRSA colonization surveillance program. It is not intended to diagnose MRSA infection nor to guide or monitor treatment for MRSA infections.     Studies/Results: No results found. Medications:  Scheduled: . antiseptic oral rinse  7 mL Mouth Rinse BID  . [START ON 2015/02/27]  ceFAZolin (ANCEF) IV  3 g Intravenous To SS-Surg  . clopidogrel  75 mg Oral Daily  . furosemide  80 mg Intravenous Daily  . hydrocerin   Topical Daily  . insulin aspart  0-9 Units Subcutaneous TID WC & HS  . ipratropium-albuterol  3 mL Nebulization QID  . metoprolol tartrate  25 mg Oral BID  . mirabegron ER  50 mg Oral Daily  . pantoprazole  40 mg Oral Q1200  . potassium chloride  20 mEq Oral Daily  . sodium chloride  3 mL Intravenous Q12H  . warfarin  2.5 mg Oral ONCE-1800  . Warfarin - Pharmacist Dosing Inpatient   Does  not apply q1800   Continuous:  XBJ:YNWGNFPRN:sodium chloride, acetaminophen **OR** acetaminophen, HYDROmorphone (DILAUDID) injection, metoprolol, ondansetron **OR** ondansetron (ZOFRAN) IV, sodium chloride     LOS: 10 days   Pamella PertGHERGHE, Sydnie Sigmund  [facility] Pager [phone] 01/30/2015, 11:20 AM  If 7PM-7AM, please contact night-coverage at [email or web address], password [email or web address]

## 2015-01-30 NOTE — Progress Notes (Signed)
Pt a/o, no c/o pain, gave pt a bath and did Foley care, VSS, pt resting comfortably, pt stable

## 2015-01-30 NOTE — Progress Notes (Signed)
Patient ID: Marta Lamas, female   DOB: 06-14-1945, 70 y.o.   MRN: [id number]    Subjective:    Breathing improving.   Objective:   Temp:  [97.8 F (36.6 C)-98.2 F (36.8 C)] 97.8 F (36.6 C) (01/02 0618) Pulse Rate:  [65-74] 65 (01/02 0618) Resp:  [18] 18 (01/02 0618) BP: (116-128)/(46-54) 116/46 mmHg (01/02 0618) SpO2:  [85 %-95 %] 93 % (01/02 0618) FiO2 (%):  [4 %] 4 % (01/02 0618) Weight:  [309 lb 9.6 oz (140.434 kg)] 309 lb 9.6 oz (140.434 kg) (01/02 0618) Last BM Date: 01/29/15  Filed Weights   01/28/15 0646 01/29/15 0611 01/30/15 0618  Weight: 314 lb (142.429 kg) 310 lb (140.615 kg) 309 lb 9.6 oz (140.434 kg)    Intake/Output Summary (Last 24 hours) at 01/30/15 0823 Last data filed at 01/30/15 4098  Gross per 24 hour  Intake    800 ml  Output   1950 ml  Net  -1150 ml    Telemetry: SR  Exam:  General: NAD  HEENT: sclera clear  Resp: CTAB  Cardiac: RRR, no m/r/g, JVD to angle of jaw  GI: abdomen soft, NT, ND  MSK: trace bilateral edema  Neuro: no focal deficits  Psych: appropriate affect  Lab Results:  Basic Metabolic Panel:  Recent Labs Lab 01/28/15 1008 01/29/15 0433 01/30/15 0410  NA 142 142 141  K 3.9 3.6 3.7  CL 88* 88* 88*  CO2 43* 46* 42*  GLUCOSE 121* 128* 122*  BUN 16 19 19   CREATININE 0.55 0.64 0.50  CALCIUM 9.5 9.5 9.3    Liver Function Tests: No results for input(s): AST, ALT, ALKPHOS, BILITOT, PROT, ALBUMIN in the last 168 hours.  CBC:  Recent Labs Lab 01/26/15 0415 01/27/15 0437 01/29/15 0433  WBC 11.6* 11.7* 11.6*  HGB 10.4* 10.1* 10.5*  HCT 37.7 35.8* 37.3  MCV 92.6 91.8 92.6  PLT 343 367 355    Cardiac Enzymes: No results for input(s): CKTOTAL, CKMB, CKMBINDEX, TROPONINI in the last 168 hours.  BNP: No results for input(s): PROBNP in the last 8760 hours.  Coagulation:  Recent Labs Lab 01/28/15 0444 01/29/15 0433 01/30/15 0410  INR 1.67* 2.40* 2.80*    ECG:   Medications:   Scheduled  Medications: . antiseptic oral rinse  7 mL Mouth Rinse BID  . clopidogrel  75 mg Oral Daily  . furosemide  80 mg Intravenous Daily  . hydrocerin   Topical Daily  . insulin aspart  0-9 Units Subcutaneous TID WC & HS  . ipratropium-albuterol  3 mL Nebulization QID  . metoprolol tartrate  25 mg Oral BID  . mirabegron ER  50 mg Oral Daily  . pantoprazole  40 mg Oral Q1200  . potassium chloride  20 mEq Oral Daily  . sodium chloride  3 mL Intravenous Q12H  . Warfarin - Pharmacist Dosing Inpatient   Does not apply q1800     Infusions:     PRN Medications:  sodium chloride, acetaminophen **OR** acetaminophen, HYDROmorphone (DILAUDID) injection, metoprolol, ondansetron **OR** ondansetron (ZOFRAN) IV, sodium chloride     Assessment/Plan   1. Acute on chronic diastolic heart failure - 11/2014 echo LVEF 50-55%, grade II diastolic dysfunction.  - I/Os document she was negative 1.1 liters yesterday, negative 25 liters since admission. She is on lasix IV 80mg  daily. Renal function remains stable, fairly stable mild alkalosis likely related to obesity hypovent as opposed to contraction alkalosis. Exam is very limited due to body habitus though  JVD appears elevated, labs do not suggest she is dry yet  - weight reported at 309, down from 344 during admission.  - continue IV diuretics today, likely getting close to changing to oral.    2. Afib - rate controlled with lopressor, she is on coumadin for stroke prevention as well as a prior history of PEs.      Dina RichJonathan Matty Vanroekel, M.D.

## 2015-01-30 NOTE — Progress Notes (Signed)
ANTICOAGULATION CONSULT NOTE   Pharmacy Consult for Warfarin Indication: atrial fibrillation  Patient Measurements: Height: 5\' 2"  (157.5 cm) Weight: (!) 309 lb 9.6 oz (140.434 kg) IBW/kg (Calculated) : 50.1  Vital Signs: Temp: 97.8 F (36.6 C) (01/02 0618) Temp Source: Oral (01/02 0618) BP: 116/46 mmHg (01/02 0618) Pulse Rate: 65 (01/02 0618)  Labs:  Recent Labs  01/28/15 0444 01/28/15 1008 01/29/15 0433 01/30/15 0410  HGB  --   --  10.5*  --   HCT  --   --  37.3  --   PLT  --   --  355  --   LABPROT 19.7*  --  25.8* 29.1*  INR 1.67*  --  2.40* 2.80*  CREATININE  --  0.55 0.64 0.50    Assessment: 69[facility] due to worsening SOB, O2 sats at the facility were reportedly 60's placed on BiPap and given IV furosemide. Patient with Afib, INR on admit 2.9 - last warfarin dose PTA 12/22. Dose held on 12/25.  INR trended up briskly o/n (1.7 >> 2.4>2.8) . CBC remains stable.  No bleeding noted.  Warfarin dose PTA 2.5mg  Mon and 5mg  all other days  Goal of Therapy:  INR 2-3 Monitor platelets by anticoagulation protocol: Yes   Plan:   Warfarin 2.5 mg x 1 tonight CBC q72h, Daily INR Monitor for s/sx bleeding  Thank you Okey RegalLisa Kenly Henckel, PharmD [phone] 01/30/2015, 10:06 AM

## 2015-01-30 NOTE — Progress Notes (Signed)
No change in d/c plan.  Awaiting stability per MD for [facility] [place].  Lorri Frederickonna T. Jaci LazierCrowder, KentuckyLCSW [phone]

## 2015-01-30 NOTE — Care Management Important Message (Signed)
Important Message  Patient Details  Name: Marta LamasLinda C Lupinski MRN: [id number] Date of Birth: [birth year]   Medicare Important Message Given:  Yes    Rayvon CharSTUTTS, Hosam Mcfetridge G 01/30/2015, 12:21 PMImportant Message  Patient Details  Name: Marta LamasLinda C Redcay MRN: [id number] Date of Birth: [birth year]   Medicare Important Message Given:  Yes    Johnay Mano, Sula SodaINA G 01/30/2015, 12:21 PM

## 2015-01-30 NOTE — Progress Notes (Signed)
[facility]opaedic [facility]uma [facility] Progress Note  Subjective  Patient is a 70 year old female well-known to the orthopedic trauma service and treated for a subacute right ankle fracture dislocation. This was performed on 11/27/2014. Patient has been treated definitively in the external fixator due to her poor soft tissue. At her last office visit about 3 weeks ago patient was scheduled to have her external fixator removed on 02/03/2015. Patient was recently admitted to [facility] on 01/08/2015 for worsening shortness of breath. She has remained on the medicine service since that time along with a cardiology consult and has had significant diuresis during this hospitalization.   Patient is doing well today on no complaints of shortness of breath or CP. She is quite eager to have her ex-fix removed.  Patient states that she has not been out of bed since admission   Patient also found to be in A. fib this admission with mild RVR  ROS As above  Objective   BP 116/46 mmHg  Pulse 65  Temp(Src) 97.8 F (36.6 C) (Oral)  Resp 18  Ht '5\' 2"'$  (1.575 m)  Wt 140.434 kg (309 lb 9.6 oz)  BMI 56.61 kg/m2  SpO2 92%  Intake/Output      01/01 0701 - 01/02 0700 01/02 0701 - 01/03 0700   P.O. 800 240   Total Intake(mL/kg) 800 (5.7) 240 (1.7)   Urine (mL/kg/hr) 1950 (0.6)    Stool 0 (0)    Total Output 1950     Net -1150 +240        Stool Occurrence 1 x      Labs  Results for JADIA, CAPERS (MRN [id number]) as of 01/30/2015 09:37  Ref. Range 01/30/2015 04:10  Sodium Latest Ref Range: 135-145 mmol/L 141  Potassium Latest Ref Range: 3.5-5.1 mmol/L 3.7  Chloride Latest Ref Range: 101-111 mmol/L 88 (L)  CO2 Latest Ref Range: 22-32 mmol/L 42 (H)  BUN Latest Ref Range: 6-20 mg/dL 19  Creatinine Latest Ref Range: 0.44-1.00 mg/dL 0.50  Calcium Latest Ref Range: 8.9-10.3 mg/dL 9.3  EGFR (Non-African Amer.) Latest Ref Range: >60 mL/min >60  EGFR (African American) Latest Ref Range: >60 mL/min >60   Glucose Latest Ref Range: 65-99 mg/dL 122 (H)  Anion gap Latest Ref Range: 5-15  11  Prothrombin Time Latest Ref Range: 11.6-15.2 seconds 29.1 (H)  INR Latest Ref Range: 0.00-1.49  2.80 (H)   Exam  Gen: Awake and alert, appears comfortable resting in bed Ext:       Right lower extremity  External fixator is stable  Dressing is in place  Distal motor and sensory functions are intact  Extremity is warm  Peripheral edema is improved from previous exams  Pin sites are stable   Assessment and Plan   POD/HD#: 33  70 year old white female admitted on 01/01/2015 for acute respiratory failure and acute on chronic CHF  1. Retained external fixator for right ankle fracture dislocation   OR tomorrow for removal of fixator  Very low risk operation  Patient will not require any paralytics that will be no significant fluid shifts  All that is required is some mild sedation  Patient could conceivably discharge to skilled nursing facility after removal  She will be permitted some touchdown weightbearing right leg postoperatively as well  She is weightbearing as tolerated on her left leg  2. Medical issues   Continue per medicine  3. DVT/PE prophylaxis:  Patient is therapeutic on Coumadin  This does not need to be stopped or  adjusted for procedure for tomorrow   4. Dispo:  OR tomorrow morning for removal of ex-fix right leg    Jari Pigg, PA-C [facility] [phone] (O) 01/30/2015 9:31 AM

## 2015-01-30 NOTE — Progress Notes (Signed)
INR 2.80.  MD aware.

## 2015-01-30 NOTE — Progress Notes (Signed)
PT Cancellation Note  Patient Details Name: [name] LamasLinda C [name] MRN: [id number] DOB: Aug 18, 1945   Cancelled Treatment:    Reason Eval/Treat Not Completed: PT screened, no needs identified, will sign off.  Pt returning to SNF and will be screened by PT then.   [name], [name] 01/30/2015, 7:27 AM   [name], PT MS Acute Rehab Dept. Number: [facility] [id number] and [facility] [phone]

## 2015-01-31 ENCOUNTER — Inpatient Hospital Stay (HOSPITAL_COMMUNITY): Payer: Medicare Other | Admitting: Certified Registered Nurse Anesthetist

## 2015-01-31 ENCOUNTER — Inpatient Hospital Stay (HOSPITAL_COMMUNITY): Payer: Medicare Other

## 2015-01-31 ENCOUNTER — Encounter (HOSPITAL_COMMUNITY): Admission: EM | Disposition: E | Payer: Self-pay | Source: Home / Self Care | Attending: Internal Medicine

## 2015-01-31 HISTORY — PX: EXTERNAL FIXATION REMOVAL: SHX5040

## 2015-01-31 LAB — PROTIME-INR
INR: 2.76 — ABNORMAL HIGH (ref 0.00–1.49)
Prothrombin Time: 28.7 seconds — ABNORMAL HIGH (ref 11.6–15.2)

## 2015-01-31 LAB — MRSA PCR SCREENING: MRSA BY PCR: NEGATIVE

## 2015-01-31 LAB — CBC WITH DIFFERENTIAL/PLATELET
Basophils Absolute: 0 10*3/uL (ref 0.0–0.1)
Basophils Relative: 0 %
EOS PCT: 7 %
Eosinophils Absolute: 0.7 10*3/uL (ref 0.0–0.7)
HEMATOCRIT: 35.6 % — AB (ref 36.0–46.0)
HEMOGLOBIN: 10.1 g/dL — AB (ref 12.0–15.0)
LYMPHS ABS: 1.3 10*3/uL (ref 0.7–4.0)
LYMPHS PCT: 13 %
MCH: 25.9 pg — ABNORMAL LOW (ref 26.0–34.0)
MCHC: 28.4 g/dL — AB (ref 30.0–36.0)
MCV: 91.3 fL (ref 78.0–100.0)
Monocytes Absolute: 1.2 10*3/uL — ABNORMAL HIGH (ref 0.1–1.0)
Monocytes Relative: 12 %
Neutro Abs: 6.8 10*3/uL (ref 1.7–7.7)
Neutrophils Relative %: 68 %
Platelets: 401 10*3/uL — ABNORMAL HIGH (ref 150–400)
RBC: 3.9 MIL/uL (ref 3.87–5.11)
RDW: 17.2 % — AB (ref 11.5–15.5)
WBC: 10 10*3/uL (ref 4.0–10.5)

## 2015-01-31 LAB — GLUCOSE, CAPILLARY
GLUCOSE-CAPILLARY: 107 mg/dL — AB (ref 65–99)
GLUCOSE-CAPILLARY: 103 mg/dL — AB (ref 65–99)
GLUCOSE-CAPILLARY: 114 mg/dL — AB (ref 65–99)
GLUCOSE-CAPILLARY: 177 mg/dL — AB (ref 65–99)
Glucose-Capillary: 107 mg/dL — ABNORMAL HIGH (ref 65–99)

## 2015-01-31 SURGERY — REMOVAL, EXTERNAL FIXATION DEVICE, LOWER EXTREMITY
Anesthesia: General | Site: Ankle | Laterality: Right

## 2015-01-31 MED ORDER — DEXTROSE 5 % IV SOLN
120.0000 mg | Freq: Once | INTRAVENOUS | Status: AC
  Administered 2015-01-31: 120 mg via INTRAVENOUS
  Filled 2015-01-31: qty 12

## 2015-01-31 MED ORDER — LACTATED RINGERS IV SOLN
INTRAVENOUS | Status: DC
  Administered 2015-01-31 (×2): via INTRAVENOUS

## 2015-01-31 MED ORDER — LIDOCAINE HCL (CARDIAC) 20 MG/ML IV SOLN
INTRAVENOUS | Status: AC
  Filled 2015-01-31: qty 5

## 2015-01-31 MED ORDER — 0.9 % SODIUM CHLORIDE (POUR BTL) OPTIME
TOPICAL | Status: DC | PRN
  Administered 2015-01-31: 1000 mL

## 2015-01-31 MED ORDER — GLYCOPYRROLATE 0.2 MG/ML IJ SOLN
INTRAMUSCULAR | Status: AC
  Filled 2015-01-31: qty 1

## 2015-01-31 MED ORDER — PRO-STAT SUGAR FREE PO LIQD
30.0000 mL | Freq: Two times a day (BID) | ORAL | Status: DC
  Administered 2015-01-31: 30 mL via ORAL
  Filled 2015-01-31 (×2): qty 30

## 2015-01-31 MED ORDER — ONDANSETRON HCL 4 MG/2ML IJ SOLN
INTRAMUSCULAR | Status: AC
  Filled 2015-01-31: qty 2

## 2015-01-31 MED ORDER — HYDROMORPHONE HCL 1 MG/ML IJ SOLN
INTRAMUSCULAR | Status: AC
  Filled 2015-01-31: qty 1

## 2015-01-31 MED ORDER — HYDROMORPHONE HCL 1 MG/ML IJ SOLN: 0.2500 mg | INTRAMUSCULAR | Status: DC | PRN

## 2015-01-31 MED ORDER — MIDAZOLAM HCL 2 MG/2ML IJ SOLN
INTRAMUSCULAR | Status: AC
  Filled 2015-01-31: qty 2

## 2015-01-31 MED ORDER — WARFARIN SODIUM 5 MG PO TABS
5.0000 mg | ORAL_TABLET | Freq: Once | ORAL | Status: AC
  Administered 2015-01-31: 5 mg via ORAL
  Filled 2015-01-31: qty 1

## 2015-01-31 MED ORDER — SUCCINYLCHOLINE CHLORIDE 20 MG/ML IJ SOLN
INTRAMUSCULAR | Status: AC
  Filled 2015-01-31: qty 1

## 2015-01-31 MED ORDER — ARTIFICIAL TEARS OP OINT
TOPICAL_OINTMENT | OPHTHALMIC | Status: AC
  Filled 2015-01-31: qty 3.5

## 2015-01-31 MED ORDER — LACTATED RINGERS IV SOLN: INTRAVENOUS | Status: DC

## 2015-01-31 MED ORDER — PROPOFOL 10 MG/ML IV BOLUS
INTRAVENOUS | Status: AC
  Filled 2015-01-31: qty 20

## 2015-01-31 MED ORDER — SODIUM CHLORIDE 0.9 % IJ SOLN
INTRAMUSCULAR | Status: AC
  Filled 2015-01-31: qty 10

## 2015-01-31 MED ORDER — PROPOFOL 10 MG/ML IV BOLUS
INTRAVENOUS | Status: DC | PRN
  Administered 2015-01-31: 30 mg via INTRAVENOUS
  Administered 2015-01-31 (×2): 20 mg via INTRAVENOUS

## 2015-01-31 MED ORDER — ADULT MULTIVITAMIN W/MINERALS CH
1.0000 | ORAL_TABLET | Freq: Every day | ORAL | Status: DC
  Filled 2015-01-31: qty 1

## 2015-01-31 MED ORDER — NALOXONE HCL 0.4 MG/ML IJ SOLN
INTRAMUSCULAR | Status: AC
  Filled 2015-01-31: qty 1

## 2015-01-31 MED ORDER — HYDROMORPHONE HCL 1 MG/ML IJ SOLN
INTRAMUSCULAR | Status: DC | PRN
  Administered 2015-01-31 (×2): .1 mg via INTRAVENOUS

## 2015-01-31 SURGICAL SUPPLY — 68 items
BANDAGE ELASTIC 4 VELCRO ST LF (GAUZE/BANDAGES/DRESSINGS) ×3 IMPLANT
BANDAGE ELASTIC 6 VELCRO ST LF (GAUZE/BANDAGES/DRESSINGS) ×3 IMPLANT
BANDAGE ESMARK 6X9 LF (GAUZE/BANDAGES/DRESSINGS) ×1 IMPLANT
BNDG CMPR 9X6 STRL LF SNTH (GAUZE/BANDAGES/DRESSINGS)
BNDG COHESIVE 6X5 TAN STRL LF (GAUZE/BANDAGES/DRESSINGS) ×3 IMPLANT
BNDG ESMARK 6X9 LF (GAUZE/BANDAGES/DRESSINGS)
BNDG GAUZE ELAST 4 BULKY (GAUZE/BANDAGES/DRESSINGS) ×6 IMPLANT
BRUSH SCRUB DISP (MISCELLANEOUS) ×6 IMPLANT
CLEANER TIP ELECTROSURG 2X2 (MISCELLANEOUS) ×3 IMPLANT
CLOSURE WOUND 1/2 X4 (GAUZE/BANDAGES/DRESSINGS)
COVER SURGICAL LIGHT HANDLE (MISCELLANEOUS) ×6 IMPLANT
CUFF TOURNIQUET SINGLE 18IN (TOURNIQUET CUFF) IMPLANT
CUFF TOURNIQUET SINGLE 24IN (TOURNIQUET CUFF) IMPLANT
CUFF TOURNIQUET SINGLE 34IN LL (TOURNIQUET CUFF) IMPLANT
DRAPE C-ARM 42X72 X-RAY (DRAPES) IMPLANT
DRAPE C-ARMOR (DRAPES) ×3 IMPLANT
DRAPE OEC MINIVIEW 54X84 (DRAPES) ×3 IMPLANT
DRAPE U-SHAPE 47X51 STRL (DRAPES) ×3 IMPLANT
DRSG ADAPTIC 3X8 NADH LF (GAUZE/BANDAGES/DRESSINGS) ×3 IMPLANT
DRSG MEPITEL 4X7.2 (GAUZE/BANDAGES/DRESSINGS) ×2 IMPLANT
ELECT REM PT RETURN 9FT ADLT (ELECTROSURGICAL) ×3
ELECTRODE REM PT RTRN 9FT ADLT (ELECTROSURGICAL) ×1 IMPLANT
EVACUATOR 1/8 PVC DRAIN (DRAIN) IMPLANT
GAUZE SPONGE 4X4 12PLY STRL (GAUZE/BANDAGES/DRESSINGS) ×3 IMPLANT
GLOVE BIO SURGEON STRL SZ7.5 (GLOVE) ×1 IMPLANT
GLOVE BIO SURGEON STRL SZ8 (GLOVE) ×1 IMPLANT
GLOVE BIOGEL PI IND STRL 7.5 (GLOVE) ×1 IMPLANT
GLOVE BIOGEL PI IND STRL 8 (GLOVE) ×1 IMPLANT
GLOVE BIOGEL PI INDICATOR 7.5 (GLOVE) ×2
GLOVE BIOGEL PI INDICATOR 8 (GLOVE) ×2
GLOVE SURG SS PI 7.5 STRL IVOR (GLOVE) ×4 IMPLANT
GLOVE SURG SS PI 8.0 STRL IVOR (GLOVE) ×2 IMPLANT
GOWN STRL REUS W/ TWL LRG LVL3 (GOWN DISPOSABLE) ×2 IMPLANT
GOWN STRL REUS W/ TWL XL LVL3 (GOWN DISPOSABLE) ×1 IMPLANT
GOWN STRL REUS W/TWL LRG LVL3 (GOWN DISPOSABLE) ×6
GOWN STRL REUS W/TWL XL LVL3 (GOWN DISPOSABLE) ×3
HOVERMATT SINGLE USE (MISCELLANEOUS) ×2 IMPLANT
KIT BASIN OR (CUSTOM PROCEDURE TRAY) ×3 IMPLANT
KIT ROOM TURNOVER OR (KITS) ×3 IMPLANT
MANIFOLD NEPTUNE II (INSTRUMENTS) ×1 IMPLANT
NEEDLE 22X1 1/2 (OR ONLY) (NEEDLE) IMPLANT
NS IRRIG 1000ML POUR BTL (IV SOLUTION) ×3 IMPLANT
PACK ORTHO EXTREMITY (CUSTOM PROCEDURE TRAY) ×3 IMPLANT
PAD ARMBOARD 7.5X6 YLW CONV (MISCELLANEOUS) ×6 IMPLANT
PAD CAST 4YDX4 CTTN HI CHSV (CAST SUPPLIES) IMPLANT
PADDING CAST COTTON 4X4 STRL (CAST SUPPLIES) ×3
PADDING CAST COTTON 6X4 STRL (CAST SUPPLIES) ×5 IMPLANT
SPONGE GAUZE 4X4 12PLY STER LF (GAUZE/BANDAGES/DRESSINGS) ×2 IMPLANT
SPONGE LAP 18X18 X RAY DECT (DISPOSABLE) ×3 IMPLANT
SPONGE SCRUB IODOPHOR (GAUZE/BANDAGES/DRESSINGS) ×3 IMPLANT
STAPLER VISISTAT 35W (STAPLE) IMPLANT
STOCKINETTE IMPERVIOUS LG (DRAPES) ×3 IMPLANT
STRIP CLOSURE SKIN 1/2X4 (GAUZE/BANDAGES/DRESSINGS) IMPLANT
SUCTION FRAZIER TIP 10 FR DISP (SUCTIONS) IMPLANT
SUT ETHILON 3 0 PS 1 (SUTURE) IMPLANT
SUT PDS AB 2-0 CT1 27 (SUTURE) IMPLANT
SUT VIC AB 0 CT1 27 (SUTURE)
SUT VIC AB 0 CT1 27XBRD ANBCTR (SUTURE) IMPLANT
SUT VIC AB 2-0 CT1 27 (SUTURE)
SUT VIC AB 2-0 CT1 TAPERPNT 27 (SUTURE) IMPLANT
SYR CONTROL 10ML LL (SYRINGE) IMPLANT
TOWEL OR 17X24 6PK STRL BLUE (TOWEL DISPOSABLE) ×6 IMPLANT
TOWEL OR 17X26 10 PK STRL BLUE (TOWEL DISPOSABLE) ×6 IMPLANT
TUBE CONNECTING 12'X1/4 (SUCTIONS) ×1
TUBE CONNECTING 12X1/4 (SUCTIONS) ×2 IMPLANT
UNDERPAD 30X30 INCONTINENT (UNDERPADS AND DIAPERS) ×3 IMPLANT
WATER STERILE IRR 1000ML POUR (IV SOLUTION) ×2 IMPLANT
YANKAUER SUCT BULB TIP NO VENT (SUCTIONS) ×3 IMPLANT

## 2015-01-31 NOTE — Progress Notes (Addendum)
Pt refusing Bipap at this time, all vital signs within normal range. RT to monitor as needed

## 2015-01-31 NOTE — Progress Notes (Addendum)
Order received to transfer pt to stepdown and request sent to bed control. Order received for goal Sats 88-92%.

## 2015-01-31 NOTE — Transfer of Care (Signed)
Immediate Anesthesia Transfer of Care Note  Patient: [name]  Procedure(s) Performed: Procedure(s): REMOVAL EXTERNAL FIXATION RIGHT ANKLE (Right)  Patient Location: PACU  Anesthesia Type:MAC  Level of Consciousness: awake, alert  and oriented  Airway & Oxygen Therapy: Patient Spontanous Breathing and Patient connected to face mask oxygen  Post-op Assessment: Report given to RN and Post -op Vital signs reviewed and stable  Post vital signs: Reviewed and stable  Last Vitals:  Filed Vitals:   01/30/15 2122 December 20, 2015 0507  BP:  122/61  Pulse: 77 69  Temp:  36.9 C  Resp:  20    Complications: No apparent anesthesia complications

## 2015-01-31 NOTE — Progress Notes (Signed)
Patient sats dropping into 70s while eating. Patient denies SOB and refuses to stop eating so we can address the desats. Patient educated regarding oxygen saturation. RN to come back after patient is done eating to address issue, patient agreeable to plan.

## 2015-01-31 NOTE — Progress Notes (Signed)
Patient arrived to floor, weight showing on air bed is very different from the weight recorded this morning for patient. Patient lifted up in hoyer lift and bed was re-zeroed. New weight for patient after was 291 lbs.

## 2015-01-31 NOTE — Anesthesia Preprocedure Evaluation (Addendum)
Anesthesia Evaluation  Patient identified by MRN, date of birth, ID band Patient awake    Reviewed: Allergy & Precautions, NPO status , Patient's Chart, lab work & pertinent test results  History of Anesthesia Complications Negative for: history of anesthetic complications  Airway Mallampati: I  TM Distance: >3 FB Neck ROM: Full    Dental  (+) Edentulous Upper, Partial Lower, Poor Dentition, Dental Advisory Given   Pulmonary shortness of breath and with exertion, asthma , sleep apnea and Continuous Positive Airway Pressure Ventilation , COPD,  oxygen dependent, PE   Pulmonary exam normal        Cardiovascular hypertension, + Past MI, + Peripheral Vascular Disease and +CHF  Normal cardiovascular exam+ dysrhythmias Atrial Fibrillation   Pulmonary hypertension. ECG - AF   Neuro/Psych Anxiety dementia    GI/Hepatic   Endo/Other  diabetes, Type 2, Oral Hypoglycemic AgentsMorbid obesity  Renal/GU      Musculoskeletal   Abdominal (+) + obese,   Peds  Hematology  (+) anemia , hgb 10   Anesthesia Other Findings   Reproductive/Obstetrics                           Anesthesia Physical Anesthesia Plan  ASA: IV  Anesthesia Plan: MAC   Post-op Pain Management:    Induction:   Airway Management Planned:   Additional Equipment:   Intra-op Plan:   Post-operative Plan:   Informed Consent:   Plan Discussed with: Surgeon  Anesthesia Plan Comments:       Anesthesia Quick Evaluation

## 2015-01-31 NOTE — Progress Notes (Signed)
Dr Noreene Larssonjoslin at bedside ABG results given he will consult with pts medical doctor and make further decicions

## 2015-01-31 NOTE — Progress Notes (Signed)
Patient sats dropping into the 70s. Placed on venti mask then moved to a non rebreather. Sats came back up after being on NRB. Patient denies SOB, or any dificulty breathing. RT to bedside. MD called. Dr. Elvera LennoxGherghe gave new orders, will implement and cont to monitor patient.

## 2015-01-31 NOTE — OR Nursing (Signed)
[name] arrived to pacu at 1156 from the OR.  Pt was supine in a deflated air bed.  Sats were reading in the 30s from mulitple sites and probes.  Pt was able to answer simple questions and denied SOB.  When asked if she was able to get her breath she nodded yes.  Her color was cyanotic, RR 20-25, unlabored respirations.  Pt pulled up and placed on facemask at 10 L oxygen. After using IS (250 x10) and PACU Stir regimen, pt is still hypoxic with sats 80s on Yoe, easily arousable, and etCO2 35-55.  Dr. Noreene LarssonJoslin called to bedside.  ABG ordered.

## 2015-01-31 NOTE — Progress Notes (Signed)
Dr Noreene LarssonJoslin at bedside awaiting ABG results

## 2015-01-31 NOTE — Progress Notes (Addendum)
[facility] PROGRESS NOTE  [name] ZOX:[id number] DOB: 01-12-1946 DOA: 01/07/2015  PCP: No primary care provider on file.  Brief HPI: 70 year old Caucasian female with a past medical history of morbid obesity, COPD, diastolic CHF, hypertension, who was recently hospitalized for a fracture of the right ankle status post ORIF and is currently in a skilled nursing facility. She presented to the emergency department with complaints of worsening shortness of breath. She was noted to be hypoxic into the 60s and 70s. X-ray showed pulmonary edema. She was hospitalized for further management. Despite treatment with IV Lasix. Patient continues to be dyspneic. Chest x-ray shows no improvement. Cardiology was consulted. Patient's respiratory status worsened on the night of 12/27. She had to be transferred back to stepdown on 12/28. She was diuresed with IV Lasix having good urinary output. By 01/27/2015 she had a net negative fluid balance of 20 L. Given clinical improvement she was transferred out of the stepdown unit to 3 E. She remains on diuretic therapy. Plan for her to be transferred to skilled nursing facility when medically stable.  Subjective: - no complaints this morning, awaiting surgery   Assessment/Plan: Acute respiratory failure with hypoxia and hypercapnia - likely multifactorial due to pulmonary edema in the setting of acute on chronic diastolic heart failure, morbid obesity with hypoventilation and OSA - currently stable on nasal cannula  - Remains on Lasix 80 mg IV daily per cards  Addendum 4 pm - celled by PACU MD that patient upon leaving the OR is hypoxic into 30s, appeared cyanotic, improving on face mask. ABG somewhat reassuring but I have asked to transfer to SDU. I later evaluated patient in stepdown, appears comfortable on nasal cannula, alert x oriented and appears at baseline. Will monitor overnight.   Acute on chronic diastolic CHF - Last Echo done on  11/29/2014 that revealed grade 2 diastolic dysfunction with preserved ejection fraction of 50-55% - She remains on Lasix 80 mg IV q daily. Renal function stable - net negative 26 L so far, weights 344 >> 311  Newly diagnosed Atrial fibrillation with mild RVR - Chads2VASC is at least 4. She is already on anticoagulation for history of PE. Will continue warfarin - Telemetry showing heart rates in the 70s in  A. Fib. - Continue metoprolol 25 mg by mouth twice a day  Obesity hypoventilation syndrome/OSA - Likely a contributor to respiratory failure. Pulmonology has seen the patient. - Unfortunately she continues to refuse CPAP despite counseling  Recent NSTEMi - She was seen by cardiology during previous hospitalization. She was started on Plavix. She has been managed medically. Due to her morbid obesity, stress test will be of low sensitivity. Continue beta blockers. Resume statin at discharge.   History of recurrent PEs - Continue warfarin per pharmacy.  Recent ORIF to right ankle - Stable.  - to OR today for removal of fixator  Normocytic anemia - Continue to monitor hemoglobin. No evidence for overt bleeding.   DVT Prophylaxis: On warfarin    Code Status: DO NOT RESUSCITATE Family Communication: Discussed with the patient. Disposition Plan: Plan to transition to SNF 1-2 days   Consultants:  Pulmonology.  Cardiology Orthopedic surgery   Procedures:  Echocardiogram 11/29/14 Study Conclusions - Left ventricle: The cavity size was normal. Wall thickness wasnormal. Systolic function was normal. The estimated ejectionfraction was in the range of 50% to 55%. Wall motion was normal;there were no regional wall motion abnormalities. Features areconsistent with a pseudonormal left ventricular filling pattern,with  concomitant abnormal relaxation and increased fillingpressure (grade 2 diastolic dysfunction). - Aortic valve: There was trivial regurgitation. - Right ventricle: The  cavity size was mildly dilated. Systolicfunction was mildly to moderately reduced. - Tricuspid valve: There was mild-moderate regurgitation. - Pulmonary arteries: Systolic pressure was moderately increased.PA peak pressure: 50 mm Hg (S).  Antibiotics: None  Objective: Vital Signs  Filed Vitals:   01/30/15 2110 01/30/15 2122 02/24/2015 0507 02/22/2015 0805  BP:   122/61   Pulse:  77 69   Temp:   98.4 F (36.9 C)   TempSrc:   Oral   Resp:   20   Height:      Weight:   141.069 kg (311 lb)   SpO2: 93%  93% 92%    Intake/Output Summary (Last 24 hours) at 02/03/2015 1146 Last data filed at 02/25/2015 1143  Gross per 24 hour  Intake    820 ml  Output   1925 ml  Net  -1105 ml   Filed Weights   01/29/15 0611 01/30/15 0618 01/30/2015 0507  Weight: 140.615 kg (310 lb) 140.434 kg (309 lb 9.6 oz) 141.069 kg (311 lb)   General appearance: alert, cooperative, appears stated age and morbidly obese Resp: Diminished air entry bilaterally with crackles and wheezing. Marked tachypnea. Cardio: S1, S2 is irregularly irregular. No S3, S4. No rubs, murmurs, or bruit. pedal edema is noted. GI: Obese. Nontender. Bowel sounds present. No obvious hepatomegaly or splenomegaly. Body habitus limits examination. Extremities: Right leg with external fixation for recent ankle fracture. Left leg with erythema and skin peeling which is chronic per patient.  Neurologic: Awake, alert. Oriented 3. No focal neurological deficits.  Lab Results:  Basic Metabolic Panel:  Recent Labs Lab 01/26/15 0415 01/27/15 0437 01/28/15 1008 01/29/15 0433 01/30/15 0410  NA 140 143 142 142 141  K 3.6 4.0 3.9 3.6 3.7  CL 86* 87* 88* 88* 88*  CO2 49* 46* 43* 46* 42*  GLUCOSE 129* 132* 121* 128* 122*  BUN 14 17 16 19 19   CREATININE 0.58 0.67 0.55 0.64 0.50  CALCIUM 9.4 9.6 9.5 9.5 9.3   CBC:  Recent Labs Lab 01/26/15 0415 01/27/15 0437 01/29/15 0433 02/13/2015 0416  WBC 11.6* 11.7* 11.6* 10.0  NEUTROABS  --   --    --  6.8  HGB 10.4* 10.1* 10.5* 10.1*  HCT 37.7 35.8* 37.3 35.6*  MCV 92.6 91.8 92.6 91.3  PLT 343 367 355 401*   BNP (last 3 results)  Recent Labs  11/27/14 0711 02/28/2015 1900  BNP 163.5* 178.8*   CBG:  Recent Labs Lab 01/30/15 1135 01/30/15 1653 01/30/15 2020 02/07/2015 0609 01/30/2015 0951  GLUCAP 146* 171* 199* 107* 103*    No results found for this or any previous visit (from the past 240 hour(s)).  Studies/Results: No results found. Medications:  Scheduled: . [MAR Hold] antiseptic oral rinse  7 mL Mouth Rinse BID  . [MAR Hold] clopidogrel  75 mg Oral Daily  . [MAR Hold] furosemide  80 mg Intravenous Daily  . [MAR Hold] hydrocerin   Topical Daily  . [MAR Hold] insulin aspart  0-9 Units Subcutaneous TID WC & HS  . [MAR Hold] ipratropium-albuterol  3 mL Nebulization QID  . [MAR Hold] metoprolol tartrate  25 mg Oral BID  . [MAR Hold] mirabegron ER  50 mg Oral Daily  . [MAR Hold] pantoprazole  40 mg Oral Q1200  . [MAR Hold] potassium chloride  20 mEq Oral Daily  . [MAR Hold] sodium chloride  3 mL Intravenous Q12H  . [MAR Hold] Warfarin - Pharmacist Dosing Inpatient   Does not apply q1800   Continuous: . lactated ringers 10 mL/hr at 27-Jan-2016 1013   PRN:[MAR Hold] sodium chloride, 0.9 % irrigation (POUR BTL), [MAR Hold] acetaminophen **OR** [MAR Hold] acetaminophen, [MAR Hold]  HYDROmorphone (DILAUDID) injection, [MAR Hold] metoprolol, [MAR Hold] ondansetron **OR** [MAR Hold] ondansetron (ZOFRAN) IV, [MAR Hold] sodium chloride     LOS: 11 days   Pamella PertGHERGHE, Rector Devonshire  [facility] Pager [phone] 02/22/2015, 11:46 AM  If 7PM-7AM, please contact night-coverage at [email or web address], password [email or web address]

## 2015-01-31 NOTE — Progress Notes (Signed)
ANTICOAGULATION CONSULT NOTE - follow up  Pharmacy Consult for Warfarin Indication: atrial fibrillation  Patient Measurements: Height: 5\' 2"  (157.5 cm) Weight: (!) 311 lb (141.069 kg) IBW/kg (Calculated) : 50.1  Vital Signs: Temp: 97.8 F (36.6 C) (01/03 1515) Temp Source: Oral (01/03 0507) BP: 130/85 mmHg (01/03 1530) Pulse Rate: 150 (01/03 1530)  Labs:  Recent Labs  01/29/15 0433 01/30/15 0410 26-Dec-2015 0416  HGB 10.5*  --  10.1*  HCT 37.3  --  35.6*  PLT 355  --  401*  LABPROT 25.8* 29.1* 28.7*  INR 2.40* 2.80* 2.76*  CREATININE 0.64 0.50  --     Assessment: 69[facility] F [facility]2015 due to worsening SOB, O2 sats at the facility were reportedly 60's placed on BiPap and given IV furosemide. Patient with Afib, INR 2.91 on admit 01/24/2015 - last warfarin dose PTA 12/22. Dose held on 12/25/ resumed on 01/23/15. INR trended up briskly o/n (1.7 >> 2.4>2.8) and today the INR is 2.76   Subacute right ankle fracture dislocation, procedure done November 27 2014. Retained external fixator.  External fixator removed today 02/01/2015 (very low risk operation per Ortho). Orthopedic surgeon noted yesterday that coumadin did not need to be stopped or adjusted for this procedure today.  Today the INR is 2.76, therapeutic. CBC remains stable with Hgb 10.1 and pltc 401K. No bleeding noted.   Warfarin dose PTA 2.5mg  Mon and 5mg  all other days  Goal of Therapy:  INR 2-3 Monitor platelets by anticoagulation protocol: Yes   Plan:   Warfarin 5 mg x 1 tonight CBC q72h, Daily INR Monitor for s/sx bleeding  Thank you Noah Delaineuth Ikeya Brockel, RPh Clinical Pharmacist Pager: [phone] 02/03/2015, 4:09 PM

## 2015-01-31 NOTE — Progress Notes (Signed)
Pt refused to be turned overnight or help with ADLs, pt with no complaints of pain overnight.

## 2015-01-31 NOTE — Progress Notes (Signed)
Initial Nutrition Assessment  DOCUMENTATION CODES:   Morbid obesity  INTERVENTION:  Provide 30 ml of Pro-stat BID, each dose provide 15 grams of protein and 100 kcal Provide Multivitamin with minerals daily   NUTRITION DIAGNOSIS:   Increased nutrient needs related to wound healing as evidenced by estimated needs.   GOAL:   Patient will meet greater than or equal to 90% of their needs   MONITOR:   PO intake, Weight trends, Supplement acceptance, Labs, Skin, I & O's  REASON FOR ASSESSMENT:   Low Braden    ASSESSMENT:   70 year old Caucasian female with a past medical history of morbid obesity, COPD, diastolic CHF, hypertension, who was recently hospitalized for a fracture of the right ankle status post ORIF and is currently in a skilled nursing facility. She presented to the emergency department with complaints of worsening shortness of breath. She was noted to be hypoxic into the 60s and 70s. X-ray showed pulmonary edema.  Pt out of room for surgery at time of visit. Patient meets criteria for Morbid Obesity based on current BMI. Per nursing notes, pt is eating 75-100% of most meals. Weight has trended down 33 lbs since admission (10% weight loss). Per MD note,  she is net negative fluid balance of 26 L. Per nursing notes, pt has a deep tissue injury on right heel and a stage II pressure ulcer on sacrum.   Labs:  Low hemoglobin, high INR, low chloride, glucose ranging 103 to 199 mg/dL  Diet Order:  Diet heart healthy/carb modified Room service appropriate?: Yes; Fluid consistency:: Thin  Skin:  Wound (see comment) (Stage II PU on sacrum; DTI on right heel; closed incision)  Last BM:  01/01  Height:   Ht Readings from Last 1 Encounters:  02/15/2015 5\' 2"  (1.575 m)    Weight:   Wt Readings from Last 1 Encounters:  02/01/2015 311 lb (141.069 kg)    Ideal Body Weight:  50 kg  BMI:  Body mass index is 56.87 kg/(m^2).  Estimated Nutritional Needs:   Kcal:   1800-2000  Protein:  95-110 grams  Fluid:  1.8-2L/day  EDUCATION NEEDS:   No education needs identified at this time  [name] RD, LDN Inpatient Clinical Dietitian Pager: [phone] After Hours Pager: [phone]

## 2015-01-31 NOTE — Op Note (Addendum)
NAMJonette [name]:  [name], [name]                 ACCOUNT NO.:  [id number]  MEDICAL RECORD NO.:  [id number]  LOCATION:  [facility]                         FACILITY:  [facility]  PHYSICIAN:  Doralee AlbinoMichael H. Carola FrostHandy, M.D. DATE OF BIRTH:  1945/02/01  DATE OF PROCEDURE:  02/06/2015 DATE OF DISCHARGE:                              OPERATIVE REPORT   PREOPERATIVE DIAGNOSES: 1. Right trimalleolar ankle fracture and dislocation status post     external fixation and pinning of the tibiotalar calcaneal joint. 2. Ulcerated pin sites.  POSTOPERATIVE DIAGNOSIS:  Healed trimalleolar ankle fracture.  PROCEDURE: 1. Removal of external fixator under anesthesia. 2. Excisional debridement of ulcerated pin sites. 3. Removal of superficial pins. 4. Stress fluoroscopy of the trimalleolar ankle fracture and     syndesmosis.  ANESTHESIA:  General.  SURGEON:  Doralee AlbinoMichael H. Carola FrostHandy, M.D.  ASSISTANT:  Montez MoritaKeith Paul, PA-C.  COMPLICATIONS:  None.  TOURNIQUET:  None.  ESTIMATED BLOOD LOSS:  Minimal.  DISPOSITION:  To PACU.  CONDITION:  Stable.  BRIEF SUMMARY AND INDICATION FOR PROCEDURE:  [name] is a 70 year old female with multiple comorbidities including prolonged current hospitalization for recurrent pulmonary compromise and CHF exacerbation. The patient was placed in the fixator in October for a subacute fracture dislocation, and because of impaired soft tissues, was unable to undergo internal fixation.  She now presents nearly two and half months Following external fixation.  We also discussed curettage of her ulcerated pin sites.  Risks discussed included pulmonary compromise and prolonged intubation, need for further surgery, DVT, PE, heart attack, stroke, ankle arthritis and multiple others.  She acknowledged these risks and strongly wished to proceed.  BRIEF SUMMARY OF PROCEDURE:  The patient was taken to the operating room and general anesthesia was induced.  Her right lower extremity was then prepped in standard  fashion.  The fixator was removed.  The pin sites, which had ulcerated to a size of 1cm around the pins, were debrided with curettes at the skin, subcutaneous tissues, muscle fascia, and near cortex of the bone excising devitalized necrotic tissue, bone debris, and desiccated fibrinous material.  This was thoroughly irrigated with saline.  Distally, the calcaneal pin site was treated in similar manner with prepping one side and removal from the opposite side, and curettage followed by irrigation, curettage again including skin, subcu, deep fascia, and the bone, but this time going all the way thru the bone canal to the opposite side of the calcaneus.  Attention was then turned to the heel where the large prominent K-wire was grasped with a vise-grip and removed.  Once this was withdrawn, the leg was examined and noted to have no gross motion of the fracture site.  C-arm was then brought in and once more under live fluoro, the trimalleolar fracture and ankle joint assessed for any instability and we did not identify any.  This was followed by placement of a posterior and stirrup splint after sterile gently compressive dressing.  The patient was then awakened from anesthesia and transferred to PACU in stable condition. Montez MoritaKeith Paul, PA-C assisted me throughout which was necessary to control the leg proximally because of the patient's large body habitus and rather severe morbid  obesity.  PROGNOSIS:  The patient will be in the splint for the next 2 weeks, and upon removal, we anticipate weightbearing progressively with the help of therapy.  She remains an increased risk for complications given the nature of her fracture initially and prolonged nonweightbearing bilaterally in addition to morbid obesity and multiple comorbidities.     Doralee Albino. Carola Frost, M.D.     [facility]/MEDQ  D:  02-25-2015  T:  02/25/2015  Job:  [id number]

## 2015-01-31 NOTE — Brief Op Note (Signed)
01/02/2015 - 02/23/2015  12:11 PM  PATIENT:  Jaclyn ShaggyLinda C Lashway  70 y.o. female  PRE-OPERATIVE DIAGNOSIS:  RETAINED EXTERNAL FIXATION RIGHT ANKLE  POST-OPERATIVE DIAGNOSIS:  RETAINED EXTERNAL FIXATION RIGHT ANKLE  PROCEDURE:  Procedure(s): 1. REMOVAL EXTERNAL FIXATION RIGHT ANKLE (Right) 2. Debridement and curettage of ulcerated pin sites 3. REMOVAL OF DEEP PIN/ K WIRE 4. STRESS FLOURO OF FRACTURE SITES  SURGEON:  Surgeon(s) and Role:    * Myrene GalasMichael Betha Shadix, MD - Primary  PHYSICIAN ASSISTANT: Montez MoritaKeith Paul, PA-C  ANESTHESIA:   general  I/O:  Total I/O In: 400 [I.V.:400] Out: -   SPECIMEN:  No Specimen  TOURNIQUET:  * No tourniquets in log *  DICTATION: Dictated.

## 2015-01-31 NOTE — Progress Notes (Signed)
Patient is refusing to wear BIPAP tonight. Patient is on NRB sat 96%. Patient is in no distress at this time.

## 2015-02-01 ENCOUNTER — Inpatient Hospital Stay (HOSPITAL_COMMUNITY): Payer: Medicare Other

## 2015-02-01 ENCOUNTER — Encounter (HOSPITAL_COMMUNITY): Payer: Self-pay | Admitting: Physician Assistant

## 2015-02-01 DIAGNOSIS — Z66 Do not resuscitate: Secondary | ICD-10-CM

## 2015-02-01 DIAGNOSIS — Z515 Encounter for palliative care: Secondary | ICD-10-CM

## 2015-02-01 DIAGNOSIS — R0600 Dyspnea, unspecified: Secondary | ICD-10-CM | POA: Insufficient documentation

## 2015-02-01 LAB — CBC
HEMATOCRIT: 39.3 % (ref 36.0–46.0)
HEMOGLOBIN: 10.8 g/dL — AB (ref 12.0–15.0)
MCH: 25.8 pg — AB (ref 26.0–34.0)
MCHC: 27.5 g/dL — ABNORMAL LOW (ref 30.0–36.0)
MCV: 94 fL (ref 78.0–100.0)
Platelets: 441 10*3/uL — ABNORMAL HIGH (ref 150–400)
RBC: 4.18 MIL/uL (ref 3.87–5.11)
RDW: 16.9 % — ABNORMAL HIGH (ref 11.5–15.5)
WBC: 13.6 10*3/uL — ABNORMAL HIGH (ref 4.0–10.5)

## 2015-02-01 LAB — GLUCOSE, CAPILLARY
GLUCOSE-CAPILLARY: 142 mg/dL — AB (ref 65–99)
GLUCOSE-CAPILLARY: 129 mg/dL — AB (ref 65–99)
Glucose-Capillary: 150 mg/dL — ABNORMAL HIGH (ref 65–99)
Glucose-Capillary: 106 mg/dL — ABNORMAL HIGH (ref 65–99)

## 2015-02-01 LAB — POCT I-STAT 3, ART BLOOD GAS (G3+)
Acid-Base Excess: 26 mmol/L — ABNORMAL HIGH (ref 0.0–2.0)
Bicarbonate: 53.6 mEq/L — ABNORMAL HIGH (ref 20.0–24.0)
O2 Saturation: 90 %
PCO2 ART: 70 mmHg — AB (ref 35.0–45.0)
PH ART: 7.489 — AB (ref 7.350–7.450)
Patient temperature: 97.3
pO2, Arterial: 57 mmHg — ABNORMAL LOW (ref 80.0–100.0)

## 2015-02-01 LAB — PROTIME-INR
INR: 4.87 — ABNORMAL HIGH (ref 0.00–1.49)
PROTHROMBIN TIME: 44.1 s — AB (ref 11.6–15.2)

## 2015-02-01 LAB — BASIC METABOLIC PANEL
Anion gap: 6 (ref 5–15)
BUN: 15 mg/dL (ref 6–20)
CHLORIDE: 86 mmol/L — AB (ref 101–111)
CO2: 48 mmol/L — ABNORMAL HIGH (ref 22–32)
Calcium: 9.4 mg/dL (ref 8.9–10.3)
Creatinine, Ser: 0.72 mg/dL (ref 0.44–1.00)
GFR calc Af Amer: >60 mL/min (ref >60)
GFR calc non Af Amer: >60 mL/min (ref >60)
Glucose, Bld: 167 mg/dL — ABNORMAL HIGH (ref 65–99)
POTASSIUM: 4.1 mmol/L (ref 3.5–5.1)
SODIUM: 140 mmol/L (ref 135–145)

## 2015-02-01 MED ORDER — SODIUM CHLORIDE 0.9 % IV SOLN
2000.0000 mg | Freq: Once | INTRAVENOUS | Status: AC
  Administered 2015-02-01: 2000 mg via INTRAVENOUS
  Filled 2015-02-01: qty 2000

## 2015-02-01 MED ORDER — VANCOMYCIN HCL IN DEXTROSE 1-5 GM/200ML-% IV SOLN
1000.0000 mg | Freq: Two times a day (BID) | INTRAVENOUS | Status: DC
  Administered 2015-02-02: 1000 mg via INTRAVENOUS
  Filled 2015-02-01 (×3): qty 200

## 2015-02-01 MED ORDER — HYDROMORPHONE HCL 1 MG/ML IJ SOLN: 0.5000 mg | INTRAMUSCULAR | Status: DC | PRN

## 2015-02-01 MED ORDER — FUROSEMIDE 10 MG/ML IJ SOLN
80.0000 mg | Freq: Three times a day (TID) | INTRAMUSCULAR | Status: DC
  Administered 2015-02-01 – 2015-02-02 (×4): 80 mg via INTRAVENOUS
  Filled 2015-02-01 (×4): qty 8

## 2015-02-01 MED ORDER — CHLORHEXIDINE GLUCONATE 0.12 % MT SOLN
15.0000 mL | Freq: Two times a day (BID) | OROMUCOSAL | Status: DC
  Administered 2015-02-01 – 2015-02-02 (×3): 15 mL via OROMUCOSAL
  Filled 2015-02-01 (×2): qty 15

## 2015-02-01 MED ORDER — PIPERACILLIN-TAZOBACTAM 3.375 G IVPB
3.3750 g | Freq: Three times a day (TID) | INTRAVENOUS | Status: DC
  Administered 2015-02-01 – 2015-02-02 (×3): 3.375 g via INTRAVENOUS
  Filled 2015-02-01 (×6): qty 50

## 2015-02-01 MED ORDER — CETYLPYRIDINIUM CHLORIDE 0.05 % MT LIQD
7.0000 mL | Freq: Two times a day (BID) | OROMUCOSAL | Status: DC
  Administered 2015-02-01 (×2): 7 mL via OROMUCOSAL

## 2015-02-01 NOTE — Anesthesia Postprocedure Evaluation (Signed)
Anesthesia Post Note  Patient: [name]  Procedure(s) Performed: Procedure(s) (LRB): REMOVAL EXTERNAL FIXATION RIGHT ANKLE (Right)  Patient location during evaluation: PACU Anesthesia Type: MAC Level of consciousness: awake and alert Pain management: pain level controlled Vital Signs Assessment: post-procedure vital signs reviewed and stable Respiratory status: spontaneous breathing, nonlabored ventilation, respiratory function stable and patient connected to nasal cannula oxygen Cardiovascular status: blood pressure returned to baseline and stable Postop Assessment: no signs of nausea or vomiting Anesthetic complications: no    Last Vitals:  Filed Vitals:   02/01/15 0600 02/01/15 0700  BP:  100/61  Pulse:  105  Temp: 36.7 C 37.1 C  Resp:  33    Last Pain:  Filed Vitals:   02/01/15 0828  PainSc: 0-No pain                 Jossue Rubenstein L

## 2015-02-01 NOTE — Progress Notes (Signed)
Patient transferred from 3E to 3S.  Written handoff report provided to receiving unit CSW. [facility] when medically [facility].  Lorri Frederickonna T. Jaci LazierCrowder, KentuckyLCSW [phone]

## 2015-02-01 NOTE — Progress Notes (Signed)
Orthopaedic Trauma Service (OTS)   Subjective: Patient reports pain as mild.  States "doing good."  Objective: Temp:  [97.3 F (36.3 C)-99.4 F (37.4 C)] 98.8 F (37.1 C) (01/04 0700) Pulse Rate:  [62-150] 105 (01/04 0700) Resp:  [16-33] 33 (01/04 0700) BP: (96-140)/(48-85) 100/61 mmHg (01/04 0700) SpO2:  [35 %-100 %] 93 % (01/04 0700) Weight:  [291 lb (131.997 kg)-304 lb 3.8 oz (138 kg)] 304 lb 3.8 oz (138 kg) (01/04 0500) Physical Exam RLE Dressing intact, clean, dry  Edema/ swelling controlled  Sens: DPN, SPN, TN intact  Motor: EHL, FHL, and lessor toe ext and flex all intact grossly  Brisk cap refill, warm to touch  Xrays looks excellent  Assessment/Plan: 1 Day Post-Op Procedure(s) (LRB): REMOVAL EXTERNAL FIXATION RIGHT ANKLE (Right) 1. PT with NWB for 2 more weeks 2. Will see back in office or in hospital at that time for conversion to CAM boot and initiation of progressive WB.  Please call if any questions or concerns in the interim.  Myrene GalasMichael Torie Towle, MD [facility] [phone] (p)

## 2015-02-01 NOTE — Progress Notes (Signed)
RT Note: RT came to give the patient her afternoon breathing treatment and I saw that the patient's O2 saturation has declined and is 85%. I paged Dr. Isidoro Donningai to discuss the patients decline in her respiratory status and to discuss placing patient on the Bipap. She agrees that we should go ahead and place her on her Bipap. Dr. Isidoro Donningai states that a family meeting will be held this evening to discuss patients plan of care. Rt will continue to monitor patient.

## 2015-02-01 NOTE — Progress Notes (Signed)
Utilization review complete. Nathanuel Cabreja RN CCM Case Mgmt phone [phone] 

## 2015-02-01 NOTE — Progress Notes (Signed)
ANTICOAGULATION CONSULT NOTE - follow up  Pharmacy Consult for Warfarin Indication: atrial fibrillation  Patient Measurements: Height: 5\' 2"  (157.5 cm) Weight: (!) 304 lb 3.8 oz (138 kg) IBW/kg (Calculated) : 50.1  Vital Signs: Temp: 98.8 F (37.1 C) (01/04 0700) Temp Source: Oral (01/04 0700) BP: 125/56 mmHg (01/04 1029) Pulse Rate: 105 (01/04 1029)  Labs:  Recent Labs  01/30/15 0410 02/18/2015 0416 02/01/15 0441  HGB  --  10.1* 10.8*  HCT  --  35.6* 39.3  PLT  --  401* 441*  LABPROT 29.1* 28.7* 44.1*  INR 2.80* 2.76* 4.87*  CREATININE 0.50  --  0.72    Assessment: 70[facility]Feb-2014 due to worsening SOB, O2 sats at the facility were reportedly 60's placed on BiPap and given IV furosemide. Patient with Afib, INR 2.91 on admit 08-Feb-2014 - last warfarin dose PTA 12/22. Dose held on 12/25/ resumed on 01/23/15. INR trended up briskly o/n (1.7 >> 2.4>2.8) and today the INR is 2.76  Subacute right ankle fracture dislocation, procedure done November 27 2014. Retained external fixator.  External fixator removed today 02/04/2015 (very low risk operation per Ortho). Orthopedic surgeon noted yesterday that coumadin did not need to be stopped or adjusted for this procedure today.   INR is 2.76 >4.87 supratherapeutic. CBC remains stable with Hgb 10.8 and pltc 441K. No bleeding noted.  Warfarin dose PTA 2.5mg  Mon and 5mg  all other days  Goal of Therapy:  INR 2-3 Monitor platelets by anticoagulation protocol: Yes   Plan:   Hold coumadin today CBC q72h, Daily INR Monitor for s/sx bleeding  Thank you  Bayard HuggerMei Adlynn Lowenstein, PharmD, BCPS  Clinical Pharmacist  Pager: [phone]   02/01/2015, 11:48 AM

## 2015-02-01 NOTE — Progress Notes (Signed)
ANTIBIOTIC CONSULT NOTE - INITIAL  Pharmacy Consult for vancomycin/zosyn Indication: sepsis/aspiration PNA  Allergies  Allergen Reactions  . Talwin [Pentazocine] Other (See Comments)    Extreme hot feeling  . Fentanyl Other (See Comments)    Fainted?  . Latex Itching    Patient Measurements: Height: 5\' 2"  (157.5 cm) Weight: (!) 304 lb 3.8 oz (138 kg) IBW/kg (Calculated) : 50.1  Vital Signs: Temp: 98.9 F (37.2 C) (01/04 1200) Temp Source: Axillary (01/04 1200) BP: 117/55 mmHg (01/04 1200) Pulse Rate: 105 (01/04 1200) Intake/Output from previous day: 01/03 0701 - 01/04 0700 In: 945 [P.O.:480; I.V.:403; IV Piggyback:62] Out: 1450 [Urine:1450] Intake/Output from this shift:    Labs:  Recent Labs  01/30/15 0410 02/05/2015 0416 02/01/15 0441  WBC  --  10.0 13.6*  HGB  --  10.1* 10.8*  PLT  --  401* 441*  CREATININE 0.50  --  0.72   Estimated Creatinine Clearance: 89.4 mL/min (by C-G formula based on Cr of 0.72). No results for input(s): VANCOTROUGH, VANCOPEAK, VANCORANDOM, GENTTROUGH, GENTPEAK, GENTRANDOM, TOBRATROUGH, TOBRAPEAK, TOBRARND, AMIKACINPEAK, AMIKACINTROU, AMIKACIN in the last 72 hours.   Microbiology: Recent Results (from the past 720 hour(s))  MRSA PCR Screening     Status: None   Collection Time: 01/18/2015 11:32 PM  Result Value Ref Range Status   MRSA by PCR NEGATIVE NEGATIVE Final    Comment:        The GeneXpert MRSA Assay (FDA approved for NASAL specimens only), is one component of a comprehensive MRSA colonization surveillance program. It is not intended to diagnose MRSA infection nor to guide or monitor treatment for MRSA infections.   MRSA PCR Screening     Status: None   Collection Time: 02/05/2015  4:13 PM  Result Value Ref Range Status   MRSA by PCR NEGATIVE NEGATIVE Final    Comment:        The GeneXpert MRSA Assay (FDA approved for NASAL specimens only), is one component of a comprehensive MRSA colonization surveillance program.  It is not intended to diagnose MRSA infection nor to guide or monitor treatment for MRSA infections.     Medical History: Past Medical History  Diagnosis Date  . Ankle fracture, right 11/27/2014  . Dislocation of right ankle joint 11/27/2014  . Shortness of breath dyspnea   . Pneumonia   . Diabetes mellitus without complication (HCC)   . Dementia   . Anxiety   . Hypertension   . CHF (congestive heart failure) (HCC)     Diastolic  . Obesity   . COPD (chronic obstructive pulmonary disease) (HCC)     Home O2 4L nasal cannula  . PE (pulmonary embolism)     on chronic coumadin, recurrent PE in 2004, 2006   Assessment: [age] YOF known to pharmacy for coumadin dosing, also has acute respiratory failure, wbc trending up 10> 13.6, increase O2 requirement, CXR - slight worsening of BL patchy airspace opacification. Pharmacy is consulted to start vancomycin and zosyn empirically for sepsis/aspiration PNA. Scr 0.72, est. crcl ~ 90 ml/min. UOP 0.4 ml/kg/hr.  Goal of Therapy:  Vancomycin trough level 15-20 mcg/ml  Plan:  - Vancomycin loading dose 2g IV x 1 - Vancomycin 1g IV Q 12 hrs - Zosyn 3.375g IV Q8 hrs (4 hr infusion) - Monitor renal function, vancomycin trough at steady state if indicated.   Bayard HuggerMei Callen Zuba, PharmD, BCPS  Clinical Pharmacist  Pager: [phone]   02/01/2015,2:36 PM

## 2015-02-01 NOTE — Progress Notes (Addendum)
[facility]                                                                              Patient Demographics  [name], is a 70 y.o. female, DOB - [birth year], ZOX:[id number]  Admit date - 01/08/2015   Admitting Physician Ron Parker, MD  Outpatient Primary MD for the patient is No primary care provider on file.  LOS - 12   Chief Complaint  Patient presents with  . Shortness of Breath       Brief HPI    70 year old Caucasian female with a past medical history of morbid obesity, COPD, diastolic CHF, hypertension, who was recently hospitalized for a fracture of the right ankle status post ORIF and is currently in a skilled nursing facility. She presented to the emergency department with complaints of worsening shortness of breath. She was noted to be hypoxic into the 60s and 70s. X-ray showed pulmonary edema. She was hospitalized for further management. Despite treatment with IV Lasix. Patient continues to be dyspneic. Chest x-ray shows no improvement. Cardiology was consulted. Patient's respiratory status worsened on the night of 12/27. She had to be transferred back to stepdown on 12/28. She was diuresed with IV Lasix having good urinary output. By 01/27/2015 she had a net negative fluid balance of 20 L. Given clinical improvement she was transferred out of the stepdown unit to 3 E. She remained on diuretic therapy.    Assessment & Plan   Acute respiratory failure with hypoxia and hypercapnia - likely multifactorial due to pulmonary edema in the setting of acute on chronic diastolic heart failure, morbid obesity with hypoventilation and OSA, IV fluids received during surgery - Respiratory status still tenuous, chest x-ray received this morning showed slight worsening of the bilateral patchy airspace opacification due to infection versus edema with small effusions. - placed on Lasix 80 mg IV 3 times a day, on NRB, refuses BiPAP /CPAP - Palliative consult also  placed - started on IV vanc and zosyn broad spectrum antibiotics   Acute on chronic diastolic CHF - Last Echo done on 11/29/2014 that revealed grade 2 diastolic dysfunction with preserved ejection fraction of 50-55% -Changed to IV Lasix 80 mg 3 times a day,  net negative 26 L so far  Newly diagnosed Atrial fibrillation with mild RVR - Chads2VASC is at least 4. She is already on anticoagulation for history of PE. Will continue warfarin - Continue metoprolol 25 mg by mouth twice a day  Obesity hypoventilation syndrome/OSA - Likely a contributor to respiratory failure. Pulmonology has seen the patient. - Unfortunately she continues to refuse CPAP despite counseling  Recent NSTEMi - She was seen by cardiology during previous hospitalization. She was started on Plavix. She has been managed medically. Due to her morbid obesity, stress test will be of low sensitivity. Continue beta blockers. Resume statin at discharge.   History of recurrent PEs - Continue warfarin per pharmacy.  Recent ORIF to right ankle - Stable. , Removal of fixator on 1/3  Normocytic anemia - Continue to monitor hemoglobin. No evidence for overt bleeding.   Code Status: dnr   Family Communication: Discussed  in detail with the patient, all imaging results, lab results explained to the patient    Disposition Plan: Remains in stepdown  Time Spent in minutes   25 minutes  Procedures  2-D echo  Consults   Cardiology  DVT Prophylaxis  warfarin  Medications  Scheduled Meds: . antiseptic oral rinse  7 mL Mouth Rinse q12n4p  . chlorhexidine  15 mL Mouth Rinse BID  . clopidogrel  75 mg Oral Daily  . feeding supplement (PRO-STAT SUGAR FREE 64)  30 mL Oral BID  . furosemide  80 mg Intravenous Q8H  . hydrocerin   Topical Daily  . insulin aspart  0-9 Units Subcutaneous TID WC & HS  . ipratropium-albuterol  3 mL Nebulization QID  . metoprolol tartrate  25 mg Oral BID  . mirabegron ER  50 mg Oral Daily  .  multivitamin with minerals  1 tablet Oral Daily  . pantoprazole  40 mg Oral Q1200  . potassium chloride  20 mEq Oral Daily  . sodium chloride  3 mL Intravenous Q12H  . Warfarin - Pharmacist Dosing Inpatient   Does not apply q1800   Continuous Infusions: . lactated ringers Stopped (02/10/2015 1600)   PRN Meds:.sodium chloride, acetaminophen **OR** acetaminophen, HYDROmorphone (DILAUDID) injection, metoprolol, ondansetron **OR** ondansetron (ZOFRAN) IV, sodium chloride   Antibiotics   Anti-infectives    Start     Dose/Rate Route Frequency Ordered Stop   02/06/2015 1000  ceFAZolin (ANCEF) 3 g in dextrose 5 % 50 mL IVPB     3 g 160 mL/hr over 30 Minutes Intravenous To [facility] 01/30/15 0947 02/21/2015 1114        Subjective:   Bear Stearns was seen and examined today. Tenuous breathing status, on NRB mask, still somewhat struggling. Denies any chest pain , abdominal pain, N/V/D/C, new weakness, numbess, tingling.   Objective:   Blood pressure 117/55, pulse 105, temperature 98.9 F (37.2 C), temperature source Axillary, resp. rate 25, height 5\' 2"  (1.575 m), weight 138 kg (304 lb 3.8 oz), SpO2 90 %.  Wt Readings from Last 3 Encounters:  02/01/15 138 kg (304 lb 3.8 oz)  11/29/14 155.856 kg (343 lb 9.6 oz)     Intake/Output Summary (Last 24 hours) at 02/01/15 1416 Last data filed at 02/01/15 1000  Gross per 24 hour  Intake    545 ml  Output   1450 ml  Net   -905 ml    Exam  General: Somewhat lethargic on NRB mask  HEENT:  PERRLA, EOMI, Anicteric Sclera, mucous membranes moist.   Neck: Supple, no JVD, no masses  CVS: S1 S2 auscultated, tachycardia   Respiratory: Decreased breath sounds at the bases  Abdomen: Soft, nontender, nondistended, + bowel sounds  Ext: no cyanosis clubbing. Left lower extremity cellulitis, right lower 17 dressing  Neuro:   Skin: No rashes  Psych: lethargic but did respond with shaking her head   Data Review   Micro  Results Recent Results (from the past 240 hour(s))  MRSA PCR Screening     Status: None   Collection Time: 02/21/2015  4:13 PM  Result Value Ref Range Status   MRSA by PCR NEGATIVE NEGATIVE Final    Comment:        The GeneXpert MRSA Assay (FDA approved for NASAL specimens only), is one component of a comprehensive MRSA colonization surveillance program. It is not intended to diagnose MRSA infection nor to guide or monitor treatment for MRSA infections.     Radiology Reports  Dg Chest Port 1 View  02/01/2015  CLINICAL DATA:  Shortness of breath, COPD, CHF. EXAM: PORTABLE CHEST 1 VIEW COMPARISON:  02/01/2015 and 01/28/2015 FINDINGS: Lungs are adequately inflated and demonstrate continued patchy bilateral airspace opacification unchanged to slightly worse and may be due to infection versus edema with small effusions and atelectasis. Stable cardiomegaly. Calcified plaque over the aortic arch. Remainder of the exam is unchanged. IMPRESSION: Slight worsening of bilateral patchy airspace opacification which may be due to infection versus edema with small effusions and atelectasis. Stable cardiomegaly. Electronically Signed   By: Elberta Fortisaniel  Boyle M.D.   On: 02/01/2015 08:18   Dg Chest Port 1 View  02/25/2015  CLINICAL DATA:  Dyspnea for 1 day EXAM: PORTABLE CHEST 1 VIEW COMPARISON:  01/28/2015 FINDINGS: Cardiomegaly again noted. There is central vascular congestion and worsening in aeration with interstitial prominence bilaterally. Findings highly suspicious for pulmonary edema. Superimposed infiltrates or alveolar edema right lower lobe and left upper lobe cannot be excluded. Clinical correlation is necessary. IMPRESSION: There is central vascular congestion and worsening in aeration with interstitial prominence bilaterally. Findings highly suspicious for pulmonary edema. Superimposed infiltrates or alveolar edema right lower lobe and left upper lobe cannot be excluded. Clinical correlation is necessary.  Electronically Signed   By: Natasha MeadLiviu  Pop M.D.   On: 01/30/2015 18:40   Dg Chest Port 1 View  01/28/2015  CLINICAL DATA:  Hypoxemia EXAM: PORTABLE CHEST 1 VIEW COMPARISON:  01/25/2015 FINDINGS: Moderate enlargement of the cardiopericardial silhouette with bilateral interstitial opacities and patchy airspace opacities in the lungs, most confluent in the retrocardiac position of the left lower lobe, with indistinctness of the left hemidiaphragm. Atherosclerotic aortic arch. Thoracic spondylosis. IMPRESSION: 1. Moderate enlargement of the cardiopericardial silhouette with worsening of the interstitial opacity and bilateral patchy airspace opacity which may reflect edema or atypical pneumonia. 2. Obscuration of the left hemidiaphragm possibly from pleural fluid or atelectasis. Electronically Signed   By: Gaylyn RongWalter  Liebkemann M.D.   On: 01/28/2015 08:24   Dg Chest Port 1 View  01/25/2015  CLINICAL DATA:  Acute onset of shortness of breath. Initial encounter. EXAM: PORTABLE CHEST 1 VIEW COMPARISON:  Chest radiograph performed 01/24/2015 FINDINGS: The lungs are well-aerated. Persistent bilateral airspace opacification raises concern for persistent pulmonary edema, slightly improved from the prior study, though pneumonia could have a similar appearance. Small bilateral pleural effusions are noted. No pneumothorax is seen. The cardiomediastinal silhouette is borderline normal in size. No acute osseous abnormalities are seen. IMPRESSION: Bilateral airspace opacification raises concern for persistent pulmonary edema, slightly improved from the prior study, though pneumonia could have a similar appearance. Small bilateral pleural effusions seen. Electronically Signed   By: Roanna RaiderJeffery  Chang M.D.   On: 01/25/2015 06:32   Dg Chest Port 1 View  01/24/2015  CLINICAL DATA:  70 year old female with congestive heart failure EXAM: PORTABLE CHEST 1 VIEW COMPARISON:  Prior chest x-ray 01/22/2015 FINDINGS: Unchanged cardiomegaly.  Atherosclerotic calcifications again noted in the transverse aorta. Interval worsening of aeration with increased bilateral interstitial and airspace opacities. Findings are concerning for worsening pulmonary edema. Small bilateral layering effusions. Associated left basilar opacity favored to reflect atelectasis. No acute osseous abnormality. IMPRESSION: Interval progression of pulmonary edema with worsening aeration bilaterally. Probable small bilateral pleural effusions with associated atelectasis versus infiltrate. Atelectasis is favored. Electronically Signed   By: Malachy MoanHeath  McCullough M.D.   On: 01/24/2015 07:25   Dg Chest Port 1 View  01/22/2015  CLINICAL DATA:  Congestive heart failure, pneumonia EXAM: PORTABLE CHEST  1 VIEW COMPARISON:  01/11/2015 FINDINGS: Cardiomegaly again noted. Persistent mild interstitial prominence bilaterally consistent with improving pulmonary edema. Persistent right infrahilar mild hazy airspace opacity. Superimposed infiltrate cannot be excluded. Atherosclerotic calcifications of thoracic aorta again noted. IMPRESSION: Persistent mild interstitial prominence bilaterally consistent with improving pulmonary edema. Persistent right infrahilar mild hazy airspace opacity. Superimposed infiltrate cannot be excluded. Clinical correlation is necessary. Electronically Signed   By: Natasha Mead M.D.   On: 01/22/2015 10:09   Dg Chest Port 1 View  01/04/2015  CLINICAL DATA:  Shortness of breath for 1 day EXAM: PORTABLE CHEST 1 VIEW COMPARISON:  01/18/2015 FINDINGS: Cardiomediastinal silhouette is stable. Atherosclerotic calcifications of thoracic aorta again noted. There is worsening in aeration with interstitial prominence bilaterally. Findings consistent with worsening pulmonary edema. Superimposed alveolar infiltrates especially in right upper and lower lobe cannot be excluded. Clinical correlation is necessary. IMPRESSION: There is worsening in aeration with interstitial prominence  bilaterally. Findings consistent with worsening pulmonary edema. Superimposed alveolar infiltrates especially in right upper and lower lobe cannot be excluded. Clinical correlation is necessary. Electronically Signed   By: Natasha Mead M.D.   On: 01/01/2015 18:39   Dg Ankle Right Port  March 02, 2015  CLINICAL DATA:  Postop from the hardware removal. EXAM: PORTABLE RIGHT ANKLE - 2 VIEW COMPARISON:  11/27/2014. FINDINGS: There is no residual orthopedic hardware. Oblique fracture of the distal fibula is noted as is a fracture across the medial malleolus. Fracture of the posterior malleolus is noted, mildly displaced superiorly by 3 mm, causing incongruency of the posterior tibial articular surface. Fracture margins are less well-defined than on the prior study consistent with interval fracture margin resorption. IMPRESSION: Trimalleolar fractures are still apparent following orthopedic hardware removal. Mild superior displacement is noted of the posterior malleolar fracture component. Electronically Signed   By: Amie Portland M.D.   On: 2015/03/02 14:58    CBC  Recent Labs Lab 01/26/15 0415 01/27/15 0437 01/29/15 0433 03/02/15 0416 02/01/15 0441  WBC 11.6* 11.7* 11.6* 10.0 13.6*  HGB 10.4* 10.1* 10.5* 10.1* 10.8*  HCT 37.7 35.8* 37.3 35.6* 39.3  PLT 343 367 355 401* 441*  MCV 92.6 91.8 92.6 91.3 94.0  MCH 25.6* 25.9* 26.1 25.9* 25.8*  MCHC 27.6* 28.2* 28.2* 28.4* 27.5*  RDW 17.4* 17.4* 17.2* 17.2* 16.9*  LYMPHSABS  --   --   --  1.3  --   MONOABS  --   --   --  1.2*  --   EOSABS  --   --   --  0.7  --   BASOSABS  --   --   --  0.0  --     Chemistries   Recent Labs Lab 01/27/15 0437 01/28/15 1008 01/29/15 0433 01/30/15 0410 02/01/15 0441  NA 143 142 142 141 140  K 4.0 3.9 3.6 3.7 4.1  CL 87* 88* 88* 88* 86*  CO2 46* 43* 46* 42* 48*  GLUCOSE 132* 121* 128* 122* 167*  BUN 17 16 19 19 15   CREATININE 0.67 0.55 0.64 0.50 0.72  CALCIUM 9.6 9.5 9.5 9.3 9.4    ------------------------------------------------------------------------------------------------------------------ estimated creatinine clearance is 89.4 mL/min (by C-G formula based on Cr of 0.72). ------------------------------------------------------------------------------------------------------------------ No results for input(s): HGBA1C in the last 72 hours. ------------------------------------------------------------------------------------------------------------------ No results for input(s): CHOL, HDL, LDLCALC, TRIG, CHOLHDL, LDLDIRECT in the last 72 hours. ------------------------------------------------------------------------------------------------------------------ No results for input(s): TSH, T4TOTAL, T3FREE, THYROIDAB in the last 72 hours.  Invalid input(s): FREET3 ------------------------------------------------------------------------------------------------------------------ No results for input(s): VITAMINB12, FOLATE, FERRITIN,  TIBC, IRON, RETICCTPCT in the last 72 hours.  Coagulation profile  Recent Labs Lab 01/28/15 0444 01/29/15 0433 01/30/15 0410 02/12/2015 0416 02/01/15 0441  INR 1.67* 2.40* 2.80* 2.76* 4.87*    No results for input(s): DDIMER in the last 72 hours.  Cardiac Enzymes No results for input(s): CKMB, TROPONINI, MYOGLOBIN in the last 168 hours.  Invalid input(s): CK ------------------------------------------------------------------------------------------------------------------ Invalid input(s): POCBNP   Recent Labs  02/11/2015 0951 02/09/2015 1159 02/09/2015 1742 02/05/2015 2110 02/01/15 0743 02/01/15 1155  GLUCAP 103* 114* 107* 177* 150* 142*     RAI,RIPUDEEP M.D. [facility] 02/01/2015, 2:16 PM  Pager: [phone] Between 7am to 7pm - call Pager - [phone]  After 7pm go to [email or web address] - password [email or web address]  Call night coverage person covering after 7pm

## 2015-02-01 NOTE — Progress Notes (Signed)
Patient Name: [name] ShaggyLinda C [name] Date of Encounter: 02/01/2015     Active Problems:   Ankle fracture, right   Acute on chronic diastolic CHF (congestive heart failure) (HCC)   CHF exacerbation (HCC)   Acute respiratory failure with hypercapnia (HCC)   Pressure ulcer   Atrial fibrillation with RVR (HCC)   Obesity hypoventilation syndrome (HCC)   Paroxysmal atrial fibrillation (HCC)   Acute respiratory failure with hypoxia (HCC)   Chronic respiratory failure with hypercapnia (HCC)   OSA (obstructive sleep apnea)   Pulmonary hypertension (HCC)   Chronic obstructive pulmonary disease (HCC)   CHF (congestive heart failure) (HCC)    SUBJECTIVE  Eyes closed, did not respond to examination.   CURRENT MEDS . antiseptic oral rinse  7 mL Mouth Rinse BID  . clopidogrel  75 mg Oral Daily  . feeding supplement (PRO-STAT SUGAR FREE 64)  30 mL Oral BID  . furosemide  80 mg Intravenous Q8H  . hydrocerin   Topical Daily  . insulin aspart  0-9 Units Subcutaneous TID WC & HS  . ipratropium-albuterol  3 mL Nebulization QID  . metoprolol tartrate  25 mg Oral BID  . mirabegron ER  50 mg Oral Daily  . multivitamin with minerals  1 tablet Oral Daily  . pantoprazole  40 mg Oral Q1200  . potassium chloride  20 mEq Oral Daily  . sodium chloride  3 mL Intravenous Q12H  . Warfarin - Pharmacist Dosing Inpatient   Does not apply q1800    OBJECTIVE  Filed Vitals:   02/01/15 0500 02/01/15 0600 02/01/15 0700 02/01/15 0920  BP:   100/61   Pulse:   105   Temp:  98 F (36.7 C) 98.8 F (37.1 C)   TempSrc:  Axillary Oral   Resp:   33   Height:      Weight: 304 lb 3.8 oz (138 kg)     SpO2:   93% 93%    Intake/Output Summary (Last 24 hours) at 02/01/15 1025 Last data filed at 02/01/15 0800  Gross per 24 hour  Intake   1645 ml  Output   1450 ml  Net    195 ml   Filed Weights   11-09-15 0507 11-09-15 1719 02/01/15 0500  Weight: 311 lb (141.069 kg) 291 lb (131.997 kg) 304 lb 3.8 oz (138 kg)     PHYSICAL EXAM  General: on nonrebreather, eyes close, did not respond during exam Neuro: Unable to check HEENT:  Normal  Neck: Supple without bruits. Lungs:  Resp regular and unlabored, anterior bilateral rhonchi Heart: tachycardic no s3, s4, or murmurs. Abdomen: Soft, non-tender, non-distended, BS + x 4.  Extremities: No clubbing, cyanosis or edema. LLE chronic cellulitis noted.  RLE in dressing.   Accessory Clinical Findings  CBC  Recent Labs  11-09-15 0416 02/01/15 0441  WBC 10.0 13.6*  NEUTROABS 6.8  --   HGB 10.1* 10.8*  HCT 35.6* 39.3  MCV 91.3 94.0  PLT 401* 441*   Basic Metabolic Panel  Recent Labs  01/30/15 0410 02/01/15 0441  NA 141 140  K 3.7 4.1  CL 88* 86*  CO2 42* 48*  GLUCOSE 122* 167*  BUN 19 15  CREATININE 0.50 0.72  CALCIUM 9.3 9.4    TELE Atrial tach with PACs vs PAT    ECG  No new EKG  Echocardiogram 11/29/2014  LV EF: 50% -  55%  ------------------------------------------------------------------- Indications:   Chest pain 786.51.  ------------------------------------------------------------------- History:  PMH: No prior  cardiac history. Risk factors: Hypertension. Morbidly obese.  ------------------------------------------------------------------- Study Conclusions  - Left ventricle: The cavity size was normal. Wall thickness was normal. Systolic function was normal. The estimated ejection fraction was in the range of 50% to 55%. Wall motion was normal; there were no regional wall motion abnormalities. Features are consistent with a pseudonormal left ventricular filling pattern, with concomitant abnormal relaxation and increased filling pressure (grade 2 diastolic dysfunction). - Aortic valve: There was trivial regurgitation. - Right ventricle: The cavity size was mildly dilated. Systolic function was mildly to moderately reduced. - Tricuspid valve: There was mild-moderate regurgitation. -  Pulmonary arteries: Systolic pressure was moderately increased. PA peak pressure: 50 mm Hg (S).    Radiology/Studies  Dg Chest Port 1 View  02/01/2015  CLINICAL DATA:  Shortness of breath, COPD, CHF. EXAM: PORTABLE CHEST 1 VIEW COMPARISON:  Mar 01, 2015 and 01/28/2015 FINDINGS: Lungs are adequately inflated and demonstrate continued patchy bilateral airspace opacification unchanged to slightly worse and may be due to infection versus edema with small effusions and atelectasis. Stable cardiomegaly. Calcified plaque over the aortic arch. Remainder of the exam is unchanged. IMPRESSION: Slight worsening of bilateral patchy airspace opacification which may be due to infection versus edema with small effusions and atelectasis. Stable cardiomegaly. Electronically Signed   By: Elberta Fortis M.D.   On: 02/01/2015 08:18   Dg Chest Port 1 View  03/01/2015  CLINICAL DATA:  Dyspnea for 1 day EXAM: PORTABLE CHEST 1 VIEW COMPARISON:  01/28/2015 FINDINGS: Cardiomegaly again noted. There is central vascular congestion and worsening in aeration with interstitial prominence bilaterally. Findings highly suspicious for pulmonary edema. Superimposed infiltrates or alveolar edema right lower lobe and left upper lobe cannot be excluded. Clinical correlation is necessary. IMPRESSION: There is central vascular congestion and worsening in aeration with interstitial prominence bilaterally. Findings highly suspicious for pulmonary edema. Superimposed infiltrates or alveolar edema right lower lobe and left upper lobe cannot be excluded. Clinical correlation is necessary. Electronically Signed   By: Natasha Mead M.D.   On: 2015-03-01 18:40   Dg Chest Port 1 View  01/28/2015  CLINICAL DATA:  Hypoxemia EXAM: PORTABLE CHEST 1 VIEW COMPARISON:  01/25/2015 FINDINGS: Moderate enlargement of the cardiopericardial silhouette with bilateral interstitial opacities and patchy airspace opacities in the lungs, most confluent in the retrocardiac  position of the left lower lobe, with indistinctness of the left hemidiaphragm. Atherosclerotic aortic arch. Thoracic spondylosis. IMPRESSION: 1. Moderate enlargement of the cardiopericardial silhouette with worsening of the interstitial opacity and bilateral patchy airspace opacity which may reflect edema or atypical pneumonia. 2. Obscuration of the left hemidiaphragm possibly from pleural fluid or atelectasis. Electronically Signed   By: Gaylyn Rong M.D.   On: 01/28/2015 08:24   Dg Chest Port 1 View  01/25/2015  CLINICAL DATA:  Acute onset of shortness of breath. Initial encounter. EXAM: PORTABLE CHEST 1 VIEW COMPARISON:  Chest radiograph performed 01/24/2015 FINDINGS: The lungs are well-aerated. Persistent bilateral airspace opacification raises concern for persistent pulmonary edema, slightly improved from the prior study, though pneumonia could have a similar appearance. Small bilateral pleural effusions are noted. No pneumothorax is seen. The cardiomediastinal silhouette is borderline normal in size. No acute osseous abnormalities are seen. IMPRESSION: Bilateral airspace opacification raises concern for persistent pulmonary edema, slightly improved from the prior study, though pneumonia could have a similar appearance. Small bilateral pleural effusions seen. Electronically Signed   By: Roanna Raider M.D.   On: 01/25/2015 06:32   Dg Chest Port 1 View  01/24/2015  CLINICAL DATA:  70 year old female with congestive heart failure EXAM: PORTABLE CHEST 1 VIEW COMPARISON:  Prior chest x-ray 01/22/2015 FINDINGS: Unchanged cardiomegaly. Atherosclerotic calcifications again noted in the transverse aorta. Interval worsening of aeration with increased bilateral interstitial and airspace opacities. Findings are concerning for worsening pulmonary edema. Small bilateral layering effusions. Associated left basilar opacity favored to reflect atelectasis. No acute osseous abnormality. IMPRESSION: Interval  progression of pulmonary edema with worsening aeration bilaterally. Probable small bilateral pleural effusions with associated atelectasis versus infiltrate. Atelectasis is favored. Electronically Signed   By: Malachy Moan M.D.   On: 01/24/2015 07:25   Dg Chest Port 1 View  01/22/2015  CLINICAL DATA:  Congestive heart failure, pneumonia EXAM: PORTABLE CHEST 1 VIEW COMPARISON:  01/27/2015 FINDINGS: Cardiomegaly again noted. Persistent mild interstitial prominence bilaterally consistent with improving pulmonary edema. Persistent right infrahilar mild hazy airspace opacity. Superimposed infiltrate cannot be excluded. Atherosclerotic calcifications of thoracic aorta again noted. IMPRESSION: Persistent mild interstitial prominence bilaterally consistent with improving pulmonary edema. Persistent right infrahilar mild hazy airspace opacity. Superimposed infiltrate cannot be excluded. Clinical correlation is necessary. Electronically Signed   By: Natasha Mead M.D.   On: 01/22/2015 10:09   Dg Chest Port 1 View  01/08/2015  CLINICAL DATA:  Shortness of breath for 1 day EXAM: PORTABLE CHEST 1 VIEW COMPARISON:  01/18/2015 FINDINGS: Cardiomediastinal silhouette is stable. Atherosclerotic calcifications of thoracic aorta again noted. There is worsening in aeration with interstitial prominence bilaterally. Findings consistent with worsening pulmonary edema. Superimposed alveolar infiltrates especially in right upper and lower lobe cannot be excluded. Clinical correlation is necessary. IMPRESSION: There is worsening in aeration with interstitial prominence bilaterally. Findings consistent with worsening pulmonary edema. Superimposed alveolar infiltrates especially in right upper and lower lobe cannot be excluded. Clinical correlation is necessary. Electronically Signed   By: Natasha Mead M.D.   On: 01/26/2015 18:39   Dg Ankle Right Port  02/24/2015  CLINICAL DATA:  Postop from the hardware removal. EXAM: PORTABLE RIGHT  ANKLE - 2 VIEW COMPARISON:  11/27/2014. FINDINGS: There is no residual orthopedic hardware. Oblique fracture of the distal fibula is noted as is a fracture across the medial malleolus. Fracture of the posterior malleolus is noted, mildly displaced superiorly by 3 mm, causing incongruency of the posterior tibial articular surface. Fracture margins are less well-defined than on the prior study consistent with interval fracture margin resorption. IMPRESSION: Trimalleolar fractures are still apparent following orthopedic hardware removal. Mild superior displacement is noted of the posterior malleolar fracture component. Electronically Signed   By: Amie Portland M.D.   On: 01/30/2015 14:58    ASSESSMENT AND PLAN  [name] is a 70 y.o. female with a history of diastolic CHF, recent non-STEMI, h/o PE, hypertension, diabetes, obesity, COPD and ORIF of right Ankle who was admitted with acute respiratory failure with hypoxemia and hypercapnia. Telemetry shows "afib" but on closer examination, appears to be sinus tach with PACs  1. Acute on chronic diastolic HF  - I/O - 26L. On 60mh TID IV lasix. CXR this morning showed worsening infiltrate, will continue IV lasix for now. Cr mildly trended up from 0.5 to 0.7. BUN stable. Long term, poor prognosis. Did not wake up for examination today.   2. Acute respiratory failure with hypoxemia  3. Morbid obesity  4. 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 H/o elevated trop during acute resp failure  - seen by cardiology in Oct 2016 for elevated trop, decided to treat medically, plavix added, ASA  stopped given need for chronic coumadin and h/o recurrent PE  6. Retained external fixator for R ankle fracture: s/p remove in OR with debridement and curettage on 02/01/2015  7. possible PAF?: currently appears to be iin atrial tach with frequent PACs vs PAT, likely related to underlying issue.   Ramond Dial PA-C Pager: [phone]   Attending Note:    The patient was seen and examined.  Agree with assessment and plan as noted above.  Changes made to the above note as needed.  Pt is s/p removal of external fixators from right lower leg. Has had more respiratory distress  Hypoxemia. Requiring more O2. Has continued to diurese nicely  CXR shows bilateral dense infiltrates.    [name], [name] Hageman., MD, [facility] 02/01/2015, 11:13 AM [address]  [address] Office [phone] Pager [phone]

## 2015-02-01 NOTE — Progress Notes (Signed)
[name] is a 70 y.o. female with a history of diastolic CHF, recent non-STEMI, hypertension, diabetes, obesity, COPD and ORIF of right Ankle who was admitted with acute respiratory failure with hypoxemia and hypercapnia.   I met with patient's daughter-in-law, Cecille Rubin [phone]), in person this evening.  Unfortunately, the rest of her family was unable to come to the hospital to meet in person for a family meeting, however, we called and said spoke with patient's son Wille Glaser [phone]), her son Jenny Reichmann [phone]), and her daughter-in-law Jenny Reichmann [phone]) via phone.  We discussed her clinical course to this point in time as well as her continued decline today in regard to her respiratory status.  They report she has been clear about her prior wishes not to be intubated nor have CPR at the end-of-life.  We discussed use of BiPAP in the setting of her respiratory failure with options for continuing BiPAP understanding the risks involved, including risk of aspiration due to the fact that her mental status is decreased, versus discontinuation of BiPAP and focusing her care on comfort.  I was clear with family that I'm very concerned about her clinical state and I feel there is a high likelihood she will continue decompensate and has a high likelihood of dying regardless of continuation of BiPAP.  Her family reports they are still trying to process everything that has been going on. I answered all of their questions regarding her current situation and clinical course to this point in time to the best of my understanding.  General: Unresponsive on BiPAP. Does not respond to verbal, tactile, or moderate nailbed pressure HEENT: mucous membranes tacky  Neck: Supple, no JVD, no masses CVS: Tachycardic. No murmur Respiratory: Decreased breath sounds Abdomen: Soft, nontender, nondistended, + bowel sounds   Ext: no cyanosis, right lower extremity dressing c/d/i  - I discussed Ms. Thornsberry's continued  clinical decline with her family. - They would like to continue with BiPAP overnight.  We discussed concern that she may continue decompensate and die regardless of our interventions. I also discussed concern that continuation of BiPAP in her current state may lead to aspiration event. - She is DO NOT RESUSCITATE/DO NOT INTUBATE and no plan to escalate care if her condition continues to decline. - She has Dilaudid order on as-needed basis. I added on the indication for dyspnea in addition to pain. She has not received any doses of this medication and does appear to be resting comfortably despite BiPAP without signs of distress on my examination. - We'll plan to follow-up tomorrow morning. I asked her bedside nurse to keep close eye on her for signs of distress and call overnight covering physician if her symptoms cannot be adequately managed with currently ordered medications.  Time in: 1810 Time out: 1900 Total time: 50 minutes  Micheline Rough, MD [facility] [place] Team [phone]  -

## 2015-02-01 NOTE — Consult Note (Signed)
Consultation Note Date: 02/01/2015   Patient Name: [name]  DOB: 06-23-1945  MRN: [id number]  Age / Sex: 70 y.o., female  PCP: No primary care provider on file. Referring Physician: Cathren Harsh, MD  Reason for Consultation: Establishing goals of care  Clinical Assessment/Narrative:   [name] is a 70 y.o. female with a history of diastolic CHF, recent non-STEMI, hypertension, diabetes, obesity, COPD and ORIF of right Ankle who was admitted with acute respiratory failure with hypoxemia and hypercapnia. She is from [facility]  She was recently 11/2014 hospitalized for a fracture of the right ankle status post ORIF, pneumonia and non-STEMI. Echo 11/2014 LVEF 50-55%, grade II diastolic dysfunction, mild to mod RV dysfunction, PASP 50.   During admission diuresed 2.3 liters, discharge weight 343 lbs. Also peak of troponin was 2.12 during admission. echocardiogram was reassuring with normal left ventricular function and no wall motion abnormality. Managed medically due to medical comorbidities. Started on plavix, no ASA due to coexisting need for anticoag given history of PEs. Stress testing low sensitivity due to severe obesity. Discharged to SNF where her Os stats were in 60s and brought to ED for further evaluation.   She was noted to be hypoxic into the 60s and 70s. X-ray showed pulmonary edema. She was diuresed with IV Lasix 40 mg twice a day, Net I&O -9618. However weight up 2lb (344-->346lb). Repeat chest x-ray today shows no improvement and cardiology consulted for further evaluation.  Further surgery for removal of internal fixation device (01-30-14)   Today patient is minimally responsive and O2 Sats are unstable on NRB mask/ 15 l.  Palliative consulted to determine GOC  This NP Lorinda Creed reviewed medical records, received report from team, assessed the patient and then spoke with  patient's  family by telephone ( Sons Joe/ wife Jacki Cones and John/ wife Arline Asp  to discuss diagnosis, prognosis, GOC, EOL wishes disposition and options.  A discussion was had today regarding advanced directives.  The difference between a aggressive medical intervention path  and a palliative comfort care path for this patient at this time was had.  Values and goals of care important to patient and family were attempted to be elicited.  The patient has told family that she does not want aggressive medical interventions to prolong life in the past.  I shared with family my worry that the patient is declining in spite of current medical interventions and the importance of further clarification of GOC in order to enhance/focus on comfort.  Family tell me they are coming to hospital as soon as they can get her which will be close to 5:30-6:00.  They hope to talk with provider at that time.  I notified Dr Isidoro Donning.    Natural trajectory and expectations at EOL were discussed.  Questions and concerns addressed.  Family encouraged to call with questions or concerns.  PMT will continue to support holistically.   Primary Decision Maker:  2 sons   HCPOA: none docuemtned   Code Status/Advance Care Planning: DNR    Code Status Orders        Start     Ordered   01/25/15 1328  Do not attempt resuscitation (DNR)   Continuous    Question Answer Comment  In the event of cardiac or respiratory ARREST Do not call a "code blue"   In the event of cardiac or respiratory ARREST Do not perform Intubation, CPR, defibrillation or ACLS   In the event of cardiac  or respiratory ARREST Use medication by any route, position, wound care, and other measures to relive pain and suffering. May use oxygen, suction and manual treatment of airway obstruction as needed for comfort.      01/25/15 1330    Advance Directive Documentation        Most Recent Value   Type of Advance Directive  Living will   Pre-existing out of facility DNR order  (yellow form or pink MOST form)     "MOST" Form in Place?        Other Directives:None  Symptom Management:   Dyspnea: Dilaudid 0.5-1 mg every 3 hrs prn-will need to liberalize once GOC are clarified   Palliative Prophylaxis:   Aspiration, Bowel Regimen, Delirium Protocol, Frequent Pain Assessment, Oral Care and Turn Reposition    Psycho-social/Spiritual:  Support System: Fair Desire for further Chaplaincy support:no   Prognosis: Patient appears to be transitioning at EOL, prognosis is likely hrs to days  Discharge Planning: Pending   Chief Complaint/ Primary Diagnoses: Present on Admission:  . Acute respiratory failure with hypercapnia (HCC) . Ankle fracture, right . Acute on chronic diastolic CHF (congestive heart failure) (HCC) . Atrial fibrillation with RVR (HCC) . Obesity hypoventilation syndrome (HCC)  I have reviewed the medical record, interviewed the patient and family, and examined the patient. The following aspects are pertinent.  Past Medical History  Diagnosis Date  . Ankle fracture, right 11/27/2014  . Dislocation of right ankle joint 11/27/2014  . Shortness of breath dyspnea   . Pneumonia   . Diabetes mellitus without complication (HCC)   . Dementia   . Anxiety   . Hypertension   . CHF (congestive heart failure) (HCC)     Diastolic  . Obesity   . COPD (chronic obstructive pulmonary disease) (HCC)     Home O2 4L nasal cannula  . PE (pulmonary embolism)     on chronic coumadin, recurrent PE in 2004, 2006   Social History   Social History  . Marital Status: Widowed    Spouse Name: N/A  . Number of Children: N/A  . Years of Education: N/A   Social History Main Topics  . Smoking status: Never Smoker   . Smokeless tobacco: None  . Alcohol Use: No  . Drug Use: No  . Sexual Activity: Not Asked   Other Topics Concern  . None   Social History Narrative   History reviewed. No pertinent family history. Scheduled Meds: . antiseptic oral  rinse  7 mL Mouth Rinse BID  . clopidogrel  75 mg Oral Daily  . feeding supplement (PRO-STAT SUGAR FREE 64)  30 mL Oral BID  . furosemide  80 mg Intravenous Q8H  . hydrocerin   Topical Daily  . insulin aspart  0-9 Units Subcutaneous TID WC & HS  . ipratropium-albuterol  3 mL Nebulization QID  . metoprolol tartrate  25 mg Oral BID  . mirabegron ER  50 mg Oral Daily  . multivitamin with minerals  1 tablet Oral Daily  . pantoprazole  40 mg Oral Q1200  . potassium chloride  20 mEq Oral Daily  . sodium chloride  3 mL Intravenous Q12H  . Warfarin - Pharmacist Dosing Inpatient   Does not apply q1800   Continuous Infusions: . lactated ringers Stopped (02/23/2015 1600)   PRN Meds:.sodium chloride, acetaminophen **OR** acetaminophen, HYDROmorphone (DILAUDID) injection, metoprolol, ondansetron **OR** ondansetron (ZOFRAN) IV, sodium chloride Medications Prior to Admission:  Prior to Admission medications   Medication Sig Start Date  End Date Taking? Authorizing Provider  albuterol (PROVENTIL) (2.5 MG/3ML) 0.083% nebulizer solution Inhale 3 mLs into the lungs every 6 (six) hours as needed for wheezing or shortness of breath.  09/26/14  Yes Historical Provider, MD  atorvastatin (LIPITOR) 80 MG tablet Take 1 tablet (80 mg total) by mouth daily at 6 PM. 11/30/14  Yes Renae Fickle, MD  cholecalciferol 2000 UNITS TABS Take 1 tablet (2,000 Units total) by mouth 2 (two) times daily. Patient taking differently: Take 2,000 Units by mouth daily.  11/30/14  Yes Renae Fickle, MD  ferrous fumarate (HEMOCYTE - 106 MG FE) 325 (106 FE) MG TABS tablet Take 1 tablet by mouth daily.    Yes Historical Provider, MD  furosemide (LASIX) 20 MG tablet Take 1 tablet (20 mg total) by mouth daily. 12/15/14  Yes Antoine Poche, MD  JANUVIA 100 MG tablet Take 100 mg by mouth daily. 10/26/14  Yes Historical Provider, MD  LORazepam (ATIVAN) 0.5 MG tablet Take 1 tablet (0.5 mg total) by mouth at bedtime. 11/30/14  Yes Renae Fickle, MD  metFORMIN (GLUCOPHAGE) 500 MG tablet Take 500 mg by mouth 3 (three) times daily.    Yes Historical Provider, MD  metoprolol tartrate (LOPRESSOR) 25 MG tablet Take 0.5 tablets (12.5 mg total) by mouth 2 (two) times daily. 11/30/14  Yes Renae Fickle, MD  mirabegron ER (MYRBETRIQ) 50 MG TB24 tablet Take 50 mg by mouth daily.   Yes Historical Provider, MD  nitroGLYCERIN (NITROSTAT) 0.4 MG SL tablet Place 1 tablet (0.4 mg total) under the tongue every 5 (five) minutes as needed for chest pain. 11/30/14  Yes Renae Fickle, MD  nystatin (MYCOSTATIN/NYSTOP) 100000 UNIT/GM POWD Apply 1 g topically daily as needed (FOR RASH).  10/21/14  Yes Historical Provider, MD  omeprazole (PRILOSEC) 20 MG capsule Take 20 mg by mouth every morning.   Yes Historical Provider, MD  oxyCODONE-acetaminophen (PERCOCET) 10-325 MG tablet Take 1 tablet by mouth every 6 (six) hours as needed for pain. 11/30/14  Yes Renae Fickle, MD  polyethylene glycol powder (GLYCOLAX/MIRALAX) powder Take 17 g by mouth daily. 11/30/14  Yes Renae Fickle, MD  ranitidine (ZANTAC) 300 MG tablet Take 300 mg by mouth at bedtime. 10/26/14  Yes Historical Provider, MD  warfarin (COUMADIN) 5 MG tablet Take 2.5-5 mg by mouth daily at 6 PM. Every sun, tues, wed, thur, fri, sat. Take 2.5 mg on Monday   Yes Historical Provider, MD  clopidogrel (PLAVIX) 75 MG tablet Take 1 tablet (75 mg total) by mouth daily. Patient not taking: Reported on 2015-01-27 11/30/14   Renae Fickle, MD   Allergies  Allergen Reactions  . Talwin [Pentazocine] Other (See Comments)    Extreme hot feeling  . Fentanyl Other (See Comments)    Fainted?  . Latex Itching    Review of Systems  Unable to perform ROS   Physical Exam  Constitutional: She appears listless. She appears ill.  Cardiovascular: Tachycardia present.   Respiratory: Accessory muscle usage present. Tachypnea noted. She is in respiratory distress. She has decreased breath sounds in the right  lower field and the left lower field.  GI: Soft.  Neurological: She appears listless.    Vital Signs: BP 125/56 mmHg  Pulse 105  Temp(Src) 98.8 F (37.1 C) (Oral)  Resp 27  Ht 5\' 2"  (1.575 m)  Wt 138 kg (304 lb 3.8 oz)  BMI 55.63 kg/m2  SpO2 88%  SpO2: SpO2: (!) 88 % O2 Device:SpO2: (!) 88 % O2 Flow Rate: .O2  Flow Rate (L/min): 15 L/min  IO: Intake/output summary:  Intake/Output Summary (Last 24 hours) at 02/01/15 1046 Last data filed at 02/01/15 1000  Gross per 24 hour  Intake    945 ml  Output   1450 ml  Net   -505 ml    LBM: Last BM Date: 01/29/15 Baseline Weight: Weight: (!) 156.4 kg (344 lb 12.8 oz) Most recent weight: Weight: (!) 138 kg (304 lb 3.8 oz)      Palliative Assessment/Data:  Flowsheet Rows        Most Recent Value   Intake Tab    Referral Department  Hospitalist   Unit at Time of Referral  Intermediate Care Unit   Palliative Care Primary Diagnosis  Pulmonary   Date Notified  02/01/15   Palliative Care Type  New Palliative care   Reason for referral  Clarify Goals of Care   Date of Admission  2014/02/07   Date first seen by Palliative Care  02/01/15   # of days Palliative referral response time  0 Day(s)   # of days IP prior to Palliative referral  12   Clinical Assessment    Psychosocial & Spiritual Assessment    Palliative Care Outcomes       Additional Data Reviewed:  CBC:    Component Value Date/Time   WBC 13.6* 02/01/2015 0441   HGB 10.8* 02/01/2015 0441   HCT 39.3 02/01/2015 0441   PLT 441* 02/01/2015 0441   MCV 94.0 02/01/2015 0441   NEUTROABS 6.8 02/13/2015 0416   LYMPHSABS 1.3 02/26/2015 0416   MONOABS 1.2* 02/23/2015 0416   EOSABS 0.7 02/01/2015 0416   BASOSABS 0.0 02/01/2015 0416   Comprehensive Metabolic Panel:    Component Value Date/Time   NA 140 02/01/2015 0441   K 4.1 02/01/2015 0441   CL 86* 02/01/2015 0441   CO2 48* 02/01/2015 0441   BUN 15 02/01/2015 0441   CREATININE 0.72 02/01/2015 0441   GLUCOSE 167*  02/01/2015 0441   CALCIUM 9.4 02/01/2015 0441   CALCIUM 8.6* 11/27/2014 0445   AST 32 11/28/2014 0405   ALT 19 11/28/2014 0405   ALKPHOS 58 11/28/2014 0405   BILITOT 1.0 11/28/2014 0405   PROT 5.9* 11/28/2014 0405   ALBUMIN 2.6* 11/28/2014 0405   Discussed with Dr Isidoro Donningai  Time In: 1400 Time Out: 1515 Time Total: 75 min Greater than 50%  of this time was spent counseling and coordinating care related to the above assessment and plan.  Signed by: Lorinda CreedLARACH, Ky Rumple, NP  Canary BrimMary W Danyia Borunda, NP  02/01/2015, 10:46 AM  Please contact Palliative Medicine Team phone at [phone] for questions and concerns.

## 2015-02-02 LAB — BASIC METABOLIC PANEL
ANION GAP: 11 (ref 5–15)
BUN: 20 mg/dL (ref 6–20)
CHLORIDE: 86 mmol/L — AB (ref 101–111)
CO2: 46 mmol/L — AB (ref 22–32)
CREATININE: 0.67 mg/dL (ref 0.44–1.00)
Calcium: 9.2 mg/dL (ref 8.9–10.3)
GFR calc non Af Amer: >60 mL/min (ref >60)
Glucose, Bld: 106 mg/dL — ABNORMAL HIGH (ref 65–99)
Potassium: 3.7 mmol/L (ref 3.5–5.1)
Sodium: 143 mmol/L (ref 135–145)

## 2015-02-02 LAB — GLUCOSE, CAPILLARY: Glucose-Capillary: 109 mg/dL — ABNORMAL HIGH (ref 65–99)

## 2015-02-02 LAB — PROTIME-INR
INR: 7.94 (ref 0.00–1.49)
Prothrombin Time: 63.6 seconds — ABNORMAL HIGH (ref 11.6–15.2)

## 2015-02-02 MED ORDER — METOPROLOL TARTRATE 1 MG/ML IV SOLN
2.5000 mg | Freq: Once | INTRAVENOUS | Status: AC
  Administered 2015-02-02: 2.5 mg via INTRAVENOUS
  Filled 2015-02-02: qty 5

## 2015-02-02 MED ORDER — HYDROMORPHONE HCL 1 MG/ML IJ SOLN: 1.0000 mg | INTRAMUSCULAR | Status: DC | PRN

## 2015-02-02 MED ORDER — METOPROLOL TARTRATE 1 MG/ML IV SOLN
2.5000 mg | Freq: Four times a day (QID) | INTRAVENOUS | Status: DC
  Administered 2015-02-02: 2.5 mg via INTRAVENOUS
  Filled 2015-02-02: qty 5

## 2015-02-02 MED ORDER — HYDROMORPHONE HCL 1 MG/ML IJ SOLN
2.0000 mg | INTRAMUSCULAR | Status: DC | PRN
  Administered 2015-02-02: 2 mg via INTRAVENOUS
  Filled 2015-02-02: qty 2

## 2015-02-02 MED ORDER — LORAZEPAM 2 MG/ML IJ SOLN
1.0000 mg | INTRAMUSCULAR | Status: DC | PRN
  Administered 2015-02-02: 1 mg via INTRAVENOUS
  Filled 2015-02-02: qty 1

## 2015-02-02 MED ORDER — VITAMIN K1 10 MG/ML IJ SOLN
5.0000 mg | Freq: Once | INTRAMUSCULAR | Status: AC
Start: 2015-02-02 — End: 2015-02-02
  Administered 2015-02-02: 5 mg via SUBCUTANEOUS
  Filled 2015-02-02: qty 0.5

## 2015-03-01 NOTE — Progress Notes (Signed)
Patient expired 1123.  Dr. Isidoro Donningai notified. Gardiner RhymeMary Larch, NP notified. [facility] called. Family was at bedside at the time of expiration. Funeral Home information obtained.

## 2015-03-01 NOTE — Progress Notes (Signed)
@  [phone] paged Merdis DelayK. Schorr of [facility] regarding pt's Critical INR of 7.94 and HR 100s-140s ST with freq PACs despite administration of PRN Lopressor. Pt remains normotensive despite elevated HR.  @approx  0430 page returned. Order received for 1 time lopressor IV dose. No orders received regarding INR. Will continue to monitor and assess.

## 2015-03-01 NOTE — Discharge Summary (Addendum)
Expiration Note/ Death Summary  [name] LamasLinda C [name]  MR#: [id number]  DOB:1945/04/05  Date of Admission: 12/31/2014 Date of Death: 02/13/2015  Attending Physician:Nevea Spiewak  Patient's PCP: No primary care provider on file.  Consults: Treatment Team: Orthopedics, Dr. Carola FrostHandy Cardiology Critical care Palliative medicine: Dr Neale BurlyFreeman  Cause of Death: Acute on chronic respiratory failure with hypoxemia and hypercapnia  Secondary Diagnoses  . Acute respiratory failure with hypercapnia (HCC) . Ankle fracture, right . Acute on chronic diastolic CHF (congestive heart failure) (HCC) . Atrial fibrillation with RVR (HCC) . Obesity hypoventilation syndrome (HCC)   history of pulmonary embolism with supratherapeutic INR    morbid obesity   Possible sepsis/ SIRS    Brief H and P: patient was admitted on 01/24/2015  For complete details please refer to admission H and P, but in brief patient was a 70 year old female with history of COPD, diastolic CHF, hypertension, diabetes mellitus, who had a recent open reduction internal fixation of the right ankle fracture [facility] due to worsening shortness of breath. Her O2 sats at the facilities were found in 60s, patient was administered Lasix at the facility and was placed on 100% O2. In ED she was evaluated and placed on BiPAP. ABG revealed pH of 7.3, PCO2 of 94.6. Patient was referred for admission.   Hospital Course: 70 year old Caucasian female with a past medical history of morbid obesity, COPD, diastolic CHF, hypertension, who was recently hospitalized for a fracture of the right ankle status post ORIF and was in a skilled nursing facility. She had presented to the emergency department with complaints of worsening shortness of breath. She was noted to be hypoxic into the 60s and 70s. X-ray had shown pulmonary edema. She was hospitalized for further management. Despite treatment with IV Lasix, patient continues to be  dyspneic. Chest x-ray shows no improvement. Cardiology was consulted. Patient's respiratory status worsened on the night of 12/27. She had to be transferred back to stepdown on 12/28. She was diuresed with IV Lasix having good urinary output. By 01/27/2015 she had a net negative fluid balance of 20 L. Given clinical improvement she was transferred out of the stepdown unit to 3 E and remained on diuretic therapy.  On 1/3, patient underwent removal of the fixator from the recent ORIF right ankle. Postoperatively patient was found in hypoxic respiratory failure, chest x-ray with pulmonary edema. Patient was given high-dose of IV Lasix however the respiratory status subsequently remained poor.  Acute respiratory failure with hypoxia and hypercapnia, acute on chronic diastolic CHF - likely multifactorial due to pulmonary edema in the setting of acute on chronic diastolic heart failure, morbid obesity with hypoventilation and OSA - On 1/4, patient was noticed to have tenuous respiratory status. Chest x-ray showed slight worsening of the bilateral patchy air space opacifications due to infection versus edema with small effusions. She was on NRB  Mask. Patient's diuretics were increased. Patient had refused BiPAP. Patient was placed on broad-spectrum IV antibiotics and palliative medicine was also consulted. Cardiology was following the patient as well.  Patient had no significant improvement despite high-dose diuretics, antibiotics,  she continued to refuse BiPAP. Patient was DO NOT RESUSCITATE status. Unfortunately patient's respiratory status continued to deteriorate and was placed on BiPAP to see if any improvement. Patient was seen by palliative medicine, Dr. Neale BurlyFreeman who discussed with patient's family in detail. Patient's family requested to continue BiPAP to see if any improvement overnight.   On removing the BiPAP this  a.m., patient's O2 sats continued to decline to 70s, at this point the family requested  complete comfort care status. Patient passed on 02-06-15 at 11:23 AM.   Newly diagnosed Atrial fibrillation with mild RVR -  patient was on beta blocker and warfarin  Obesity hypoventilation syndrome/OSA - Likely a contributor to respiratory failure. patient was followed pulmonology critical care as well during this hospitalization, she was recommended BiPAP at night for OHS. Unfortunately patient refused Bipap. despite counseling.  Recent NSTEMi - She was seen by cardiology during previous hospitalization. She was started on Plavix. She haD been managed medically.  History of recurrent PEs: patient was continued on warfarin. INR was therapeutic.  Recent ORIF to right ankle - The fixator was removed on 1/3, postoperatively, patient went into pulmonary edema and hypoxic respiratory failure.     SignedThad Ranger M.D. [facility] 2015-02-06, 1:22 PM Pager: [phone]   Coding query  She had acute respiratory failure with hypoxia and acute on chronic CHF due to fluid overload, hypoventilation due to OSA. Patient had underlying COPD but no COPD exacerbation.  Unclear about patient's pressure ulcer, not reported to me.   [name] M.D. [facility] 03/19/2015, 2:23 PM  Pager: [phone]  Coding query Unclear if patient had any sepsis or SIRS on admission. Per Dr Lovell Sheehan admission note, patient was admitted for dyspnea due to acute on chronic CHF requiring BiPAP and diuresis.   [name] M.D. [facility] 03/20/2015, 1:48 PM  Pager: [phone]

## 2015-03-01 NOTE — Progress Notes (Signed)
ANTICOAGULATION CONSULT NOTE - follow up  Pharmacy Consult for Warfarin Indication: atrial fibrillation  Patient Measurements: Height: 5\' 2"  (157.5 cm) Weight: 297 lb 9.9 oz (135 kg) IBW/kg (Calculated) : 50.1  Vital Signs: Temp: 99.2 F (37.3 C) (01/05 0833) Temp Source: Axillary (01/05 0833) BP: 99/44 mmHg (01/05 0850) Pulse Rate: 94 (01/05 0850)  Labs:  Recent Labs  02/03/2015 0416 02/01/15 0441 08-23-15 0303  HGB 10.1* 10.8*  --   HCT 35.6* 39.3  --   PLT 401* 441*  --   LABPROT 28.7* 44.1* 63.6*  INR 2.76* 4.87* 7.94*  CREATININE  --  0.72 0.67    Assessment: 69yo F [facility] on 01/13/2015 due to worsening SOB, O2 sats at the facility were reportedly 60's placed on BiPap and given IV furosemide. Patient with Afib, INR 2.91 on admit 01/22/2015 - last warfarin dose PTA 12/22. Dose held on 12/25/ resumed on 01/23/15.  Subacute right ankle fracture dislocation, procedure done November 27 2014. Retained external fixator.  External fixator removed today 02/25/2015 (very low risk operation per Ortho). Orthopedic surgeon noted that coumadin did not need to be stopped or adjusted for the procedure.   INR is 2.76 > 4.87 > 7.94 supratherapeutic. No new CBC today, was stable yesterday Hgb 10.8 and pltc 441K. No bleeding noted.  Warfarin dose PTA 2.5mg  Mon and 5mg  all other days  Goal of Therapy:  INR 2-3 Monitor platelets by anticoagulation protocol: Yes   Plan:   Hold warfarin again today CBC q72h, Daily INR (CBC now ordered daily by MD) Monitor for s/sx bleeding  Thank you  Renard Hamperonya Citlaly Camplin, PharmD  Clinical Pharmacist   02/01/2015, 10:44 AM

## 2015-03-01 NOTE — Progress Notes (Addendum)
PT Cancellation Note  Patient Details Name: [name] LamasLinda C [name] MRN: [id number] DOB: 08/02/1945   Cancelled Treatment:    Reason Eval/Treat Not Completed: Medical issues which prohibited therapy.  Per RN pt on BiPAP and not currently appropriate for PT. PT will continue to follow acutely.    Michail JewelsAshley Parr PT, DPT [phone] Pager: [phone] 02/11/2015, 8:59 AM

## 2015-03-01 DEATH — deceased

## 2015-03-22 ENCOUNTER — Ambulatory Visit: Payer: Medicare Other | Admitting: Cardiology

## 2016-11-05 IMAGING — CR DG KNEE 1-2V PORT*R*
1 series · 1 of 1 positions shown · non-contrast
Comparison: None.

CLINICAL DATA: Large body habitus. Fall. History of right knee
arthroplasty.

EXAM:
PORTABLE RIGHT KNEE - 1-2 VIEW

[AP]
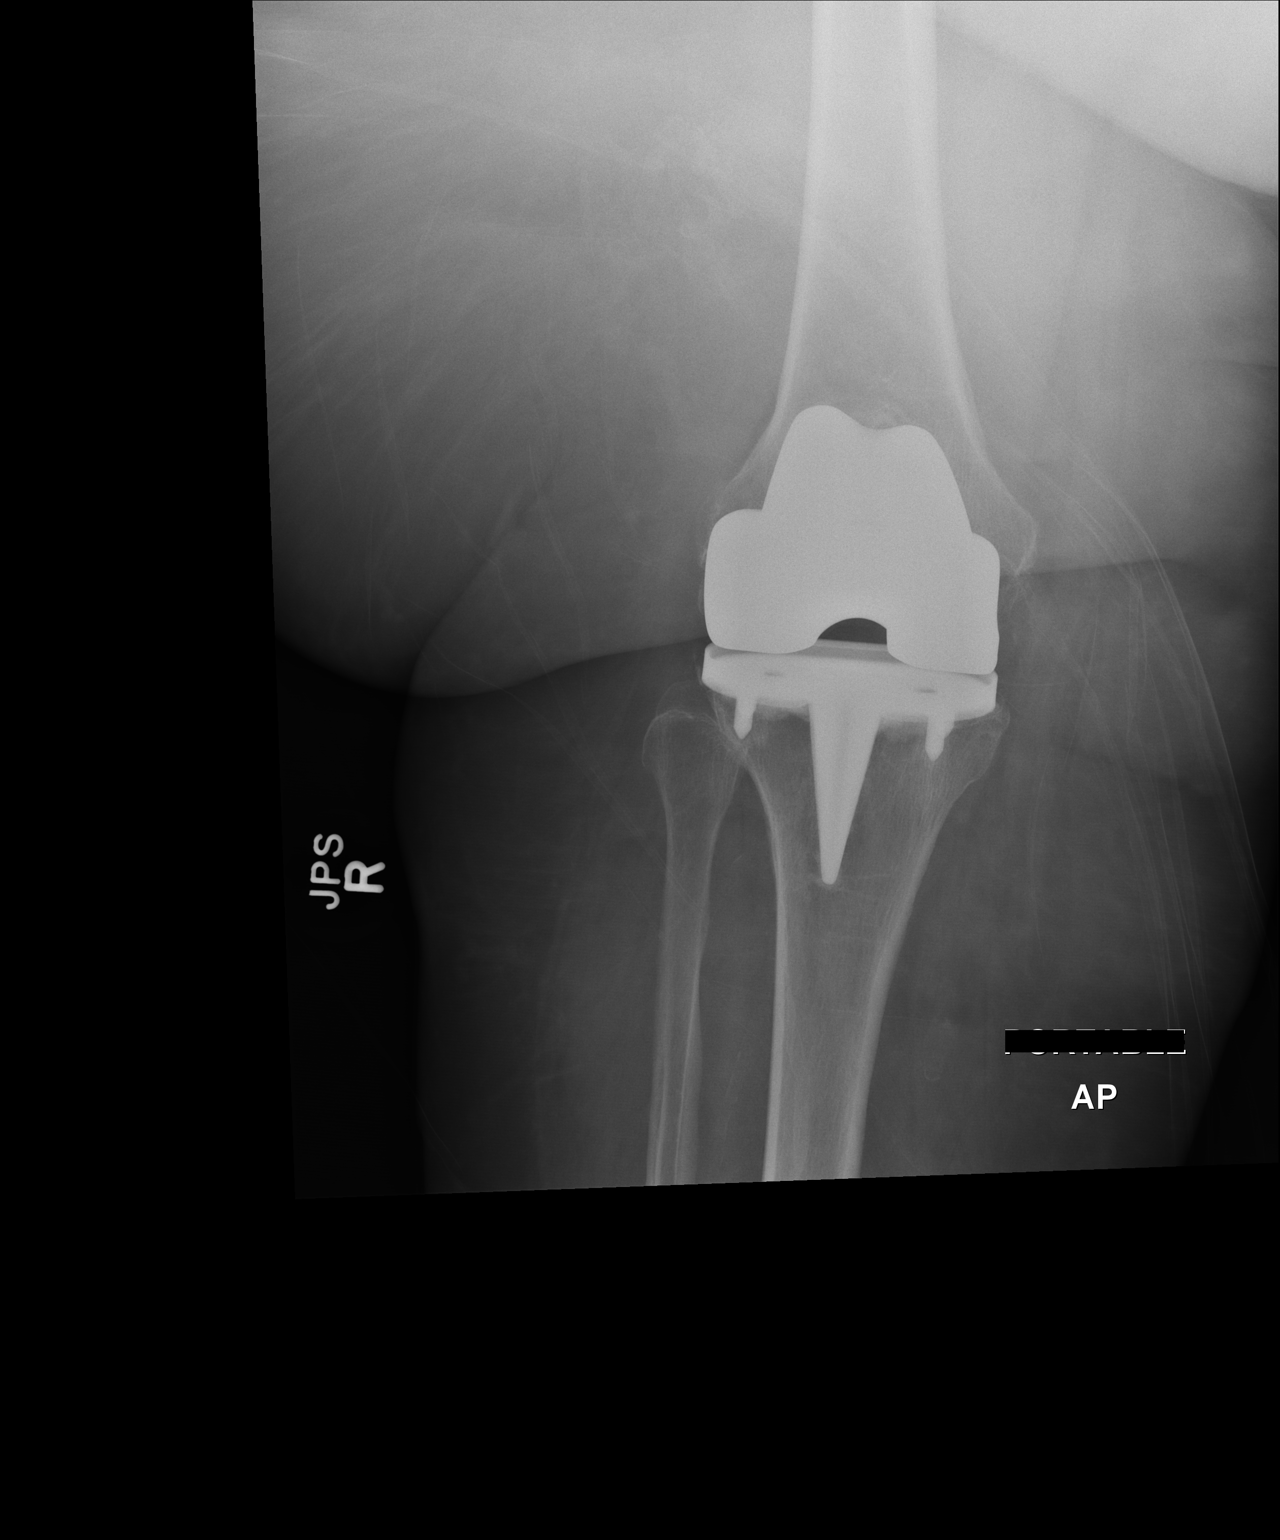

[1 of 1 positions shown; findings below may reference images not displayed]

FINDINGS: Previous right total knee arthroplasty. No evidence for joint
effusion. No periprosthetic fracture or dislocation.
IMPRESSION: 1. No acute findings noted.

## 2016-12-28 IMAGING — CR DG CHEST 1V PORT
1 series · 1 of 1 positions shown · non-contrast
Comparison: 01/18/2015

CLINICAL DATA: Shortness of breath for 1 day

EXAM:
PORTABLE CHEST 1 VIEW

[AP]
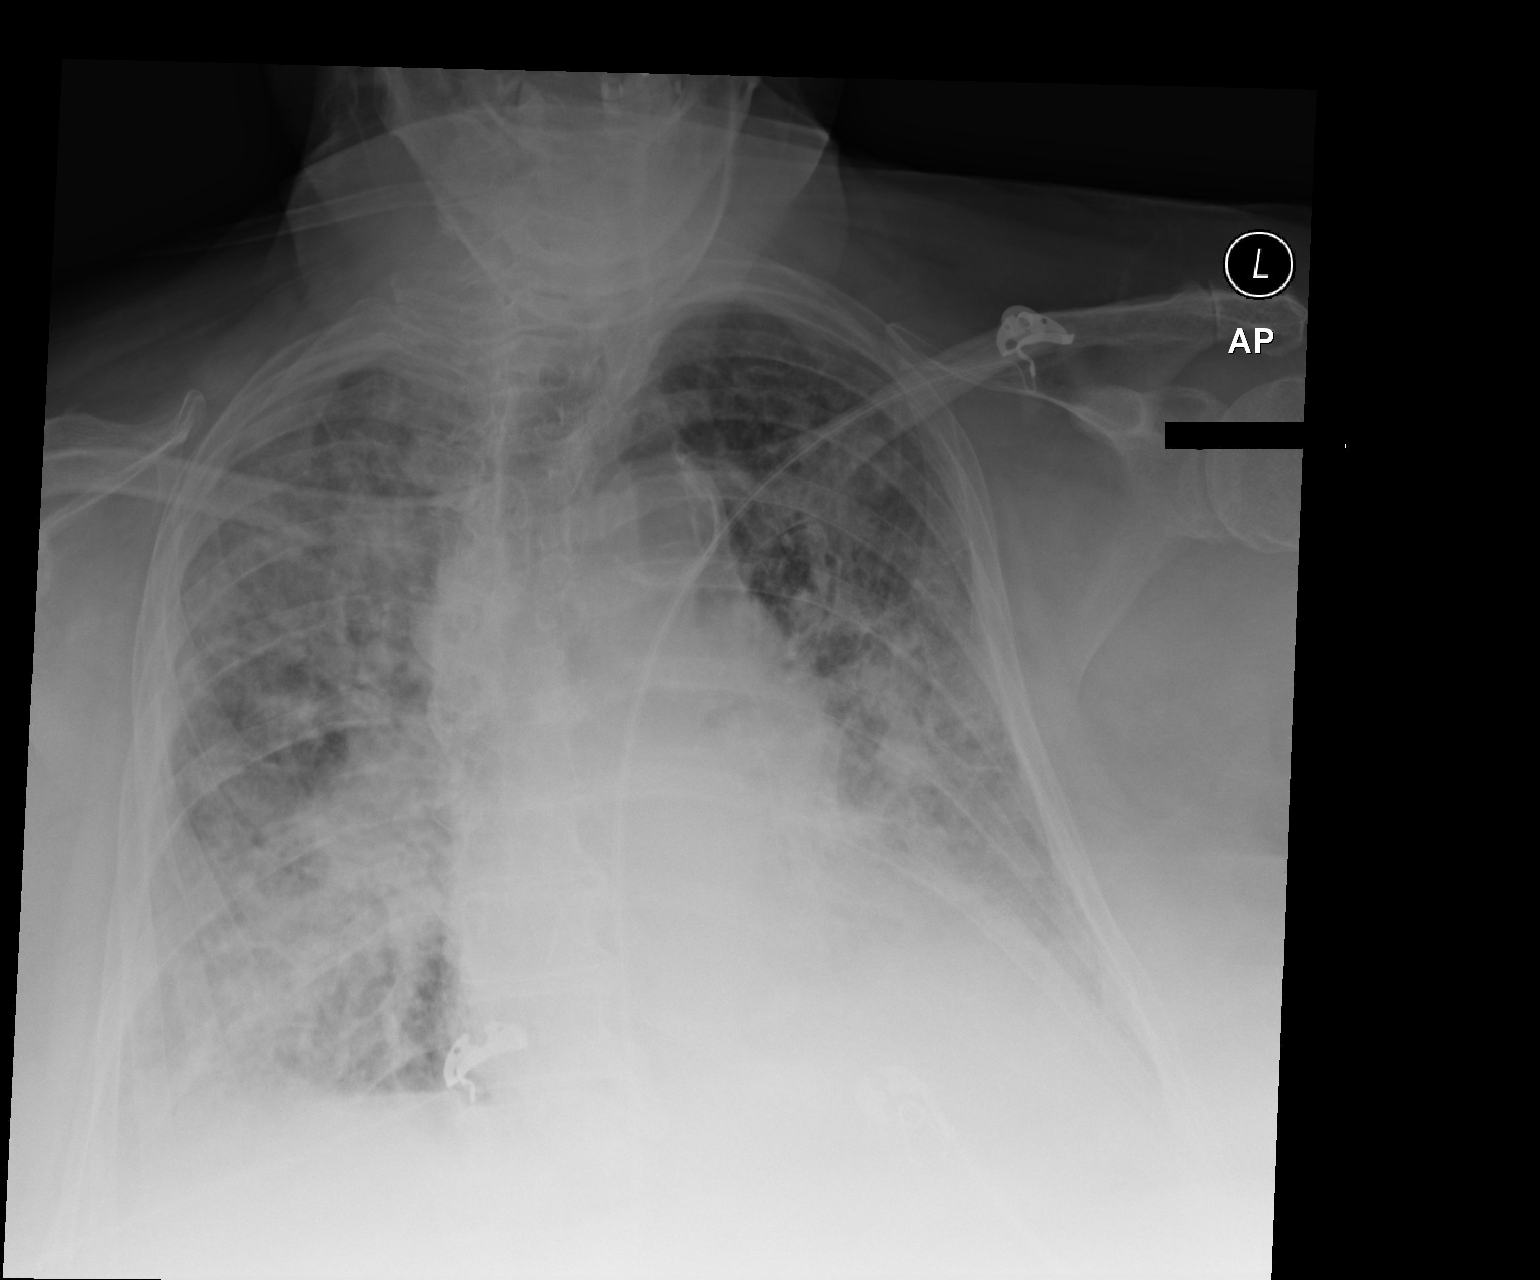

[1 of 1 positions shown; findings below may reference images not displayed]

FINDINGS: Cardiomediastinal silhouette is stable. Atherosclerotic
calcifications of thoracic aorta again noted. There is worsening in
aeration with interstitial prominence bilaterally. Findings
consistent with worsening pulmonary edema. Superimposed alveolar
infiltrates especially in right upper and lower lobe cannot be
excluded. Clinical correlation is necessary.
IMPRESSION: There is worsening in aeration with interstitial prominence
bilaterally. Findings consistent with worsening pulmonary edema.
Superimposed alveolar infiltrates especially in right upper and
lower lobe cannot be excluded. Clinical correlation is necessary.

## 2016-12-30 IMAGING — DX DG CHEST 1V PORT
1 series · 1 of 1 positions shown · non-contrast
Comparison: 01/20/2015

CLINICAL DATA: Congestive heart failure, pneumonia

EXAM:
PORTABLE CHEST 1 VIEW

[chest ap]
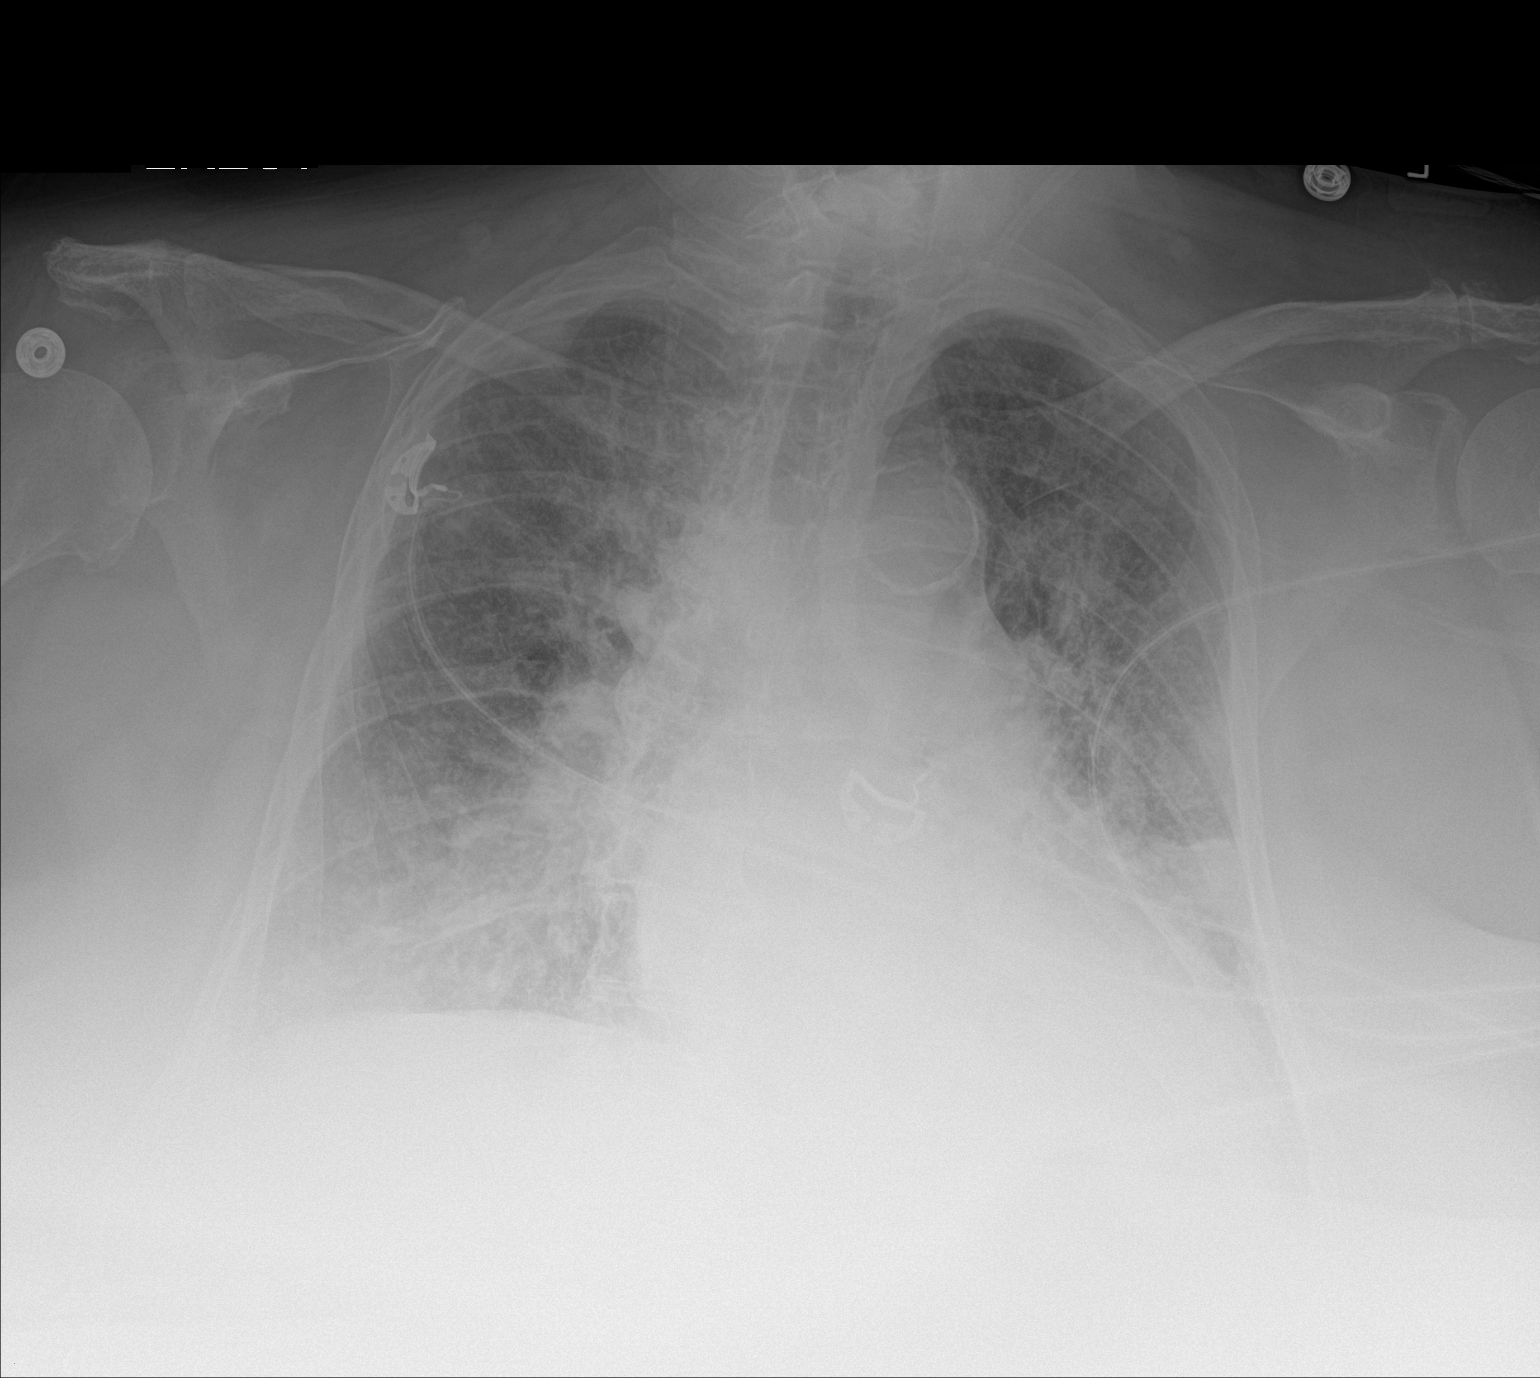

[1 of 1 positions shown; findings below may reference images not displayed]

FINDINGS: Cardiomegaly again noted. Persistent mild interstitial prominence
bilaterally consistent with improving pulmonary edema. Persistent
right infrahilar mild hazy airspace opacity. Superimposed infiltrate
cannot be excluded. Atherosclerotic calcifications of thoracic aorta
again noted.
IMPRESSION: Persistent mild interstitial prominence bilaterally consistent with
improving pulmonary edema. Persistent right infrahilar mild hazy
airspace opacity. Superimposed infiltrate cannot be excluded.
Clinical correlation is necessary.

## 2017-01-01 IMAGING — CR DG CHEST 1V PORT
1 series · 1 of 1 positions shown · non-contrast
Comparison: Prior chest x-ray 01/22/2015

CLINICAL DATA: 69-year-old female with congestive heart failure

EXAM:
PORTABLE CHEST 1 VIEW

[AP]
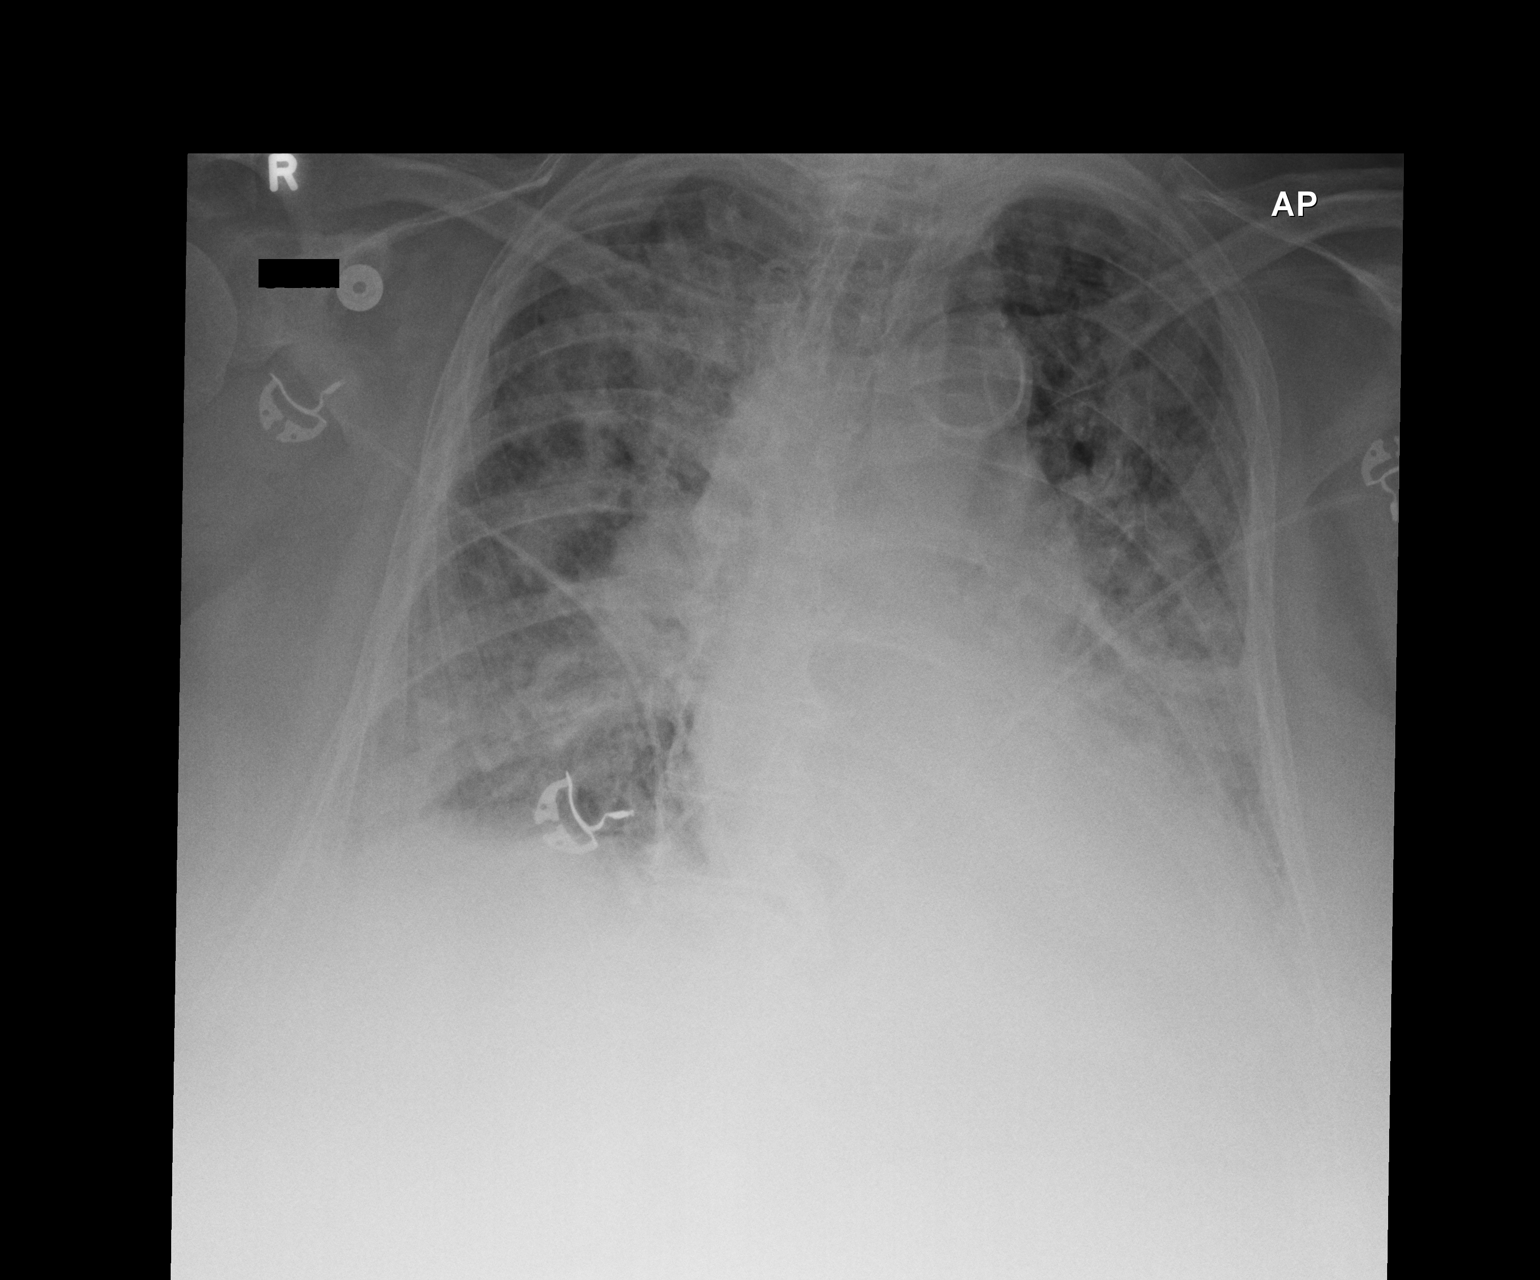

[1 of 1 positions shown; findings below may reference images not displayed]

FINDINGS: Unchanged cardiomegaly. Atherosclerotic calcifications again noted
in the transverse aorta. Interval worsening of aeration with
increased bilateral interstitial and airspace opacities. Findings
are concerning for worsening pulmonary edema. Small bilateral
layering effusions. Associated left basilar opacity favored to
reflect atelectasis. No acute osseous abnormality.
IMPRESSION: Interval progression of pulmonary edema with worsening aeration
bilaterally.

Probable small bilateral pleural effusions with associated
atelectasis versus infiltrate. Atelectasis is favored.
